# Patient Record
Sex: Female | Born: 1945 | Race: Black or African American | Hispanic: No | Smoking: Former smoker
Health system: Southern US, Community
[De-identification: ages and names within clinical notes are randomized; demographics above are authoritative.]

## PROBLEM LIST (undated history)

## (undated) DIAGNOSIS — I714 Abdominal aortic aneurysm, without rupture, unspecified: Secondary | ICD-10-CM

## (undated) DIAGNOSIS — D689 Coagulation defect, unspecified: Secondary | ICD-10-CM

## (undated) DIAGNOSIS — E039 Hypothyroidism, unspecified: Secondary | ICD-10-CM

## (undated) DIAGNOSIS — I10 Essential (primary) hypertension: Secondary | ICD-10-CM

## (undated) DIAGNOSIS — Z940 Kidney transplant status: Secondary | ICD-10-CM

## (undated) HISTORY — PX: KIDNEY TRANSPLANT: SHX239

## (undated) HISTORY — DX: Abdominal aortic aneurysm, without rupture: I71.4

## (undated) HISTORY — DX: Hypothyroidism, unspecified: E03.9

## (undated) HISTORY — DX: Kidney transplant status: Z94.0

## (undated) HISTORY — DX: Morbid (severe) obesity due to excess calories: E66.01

## (undated) HISTORY — PX: ABDOMINAL AORTIC ANEURYSM REPAIR: SHX42

## (undated) HISTORY — DX: Abdominal aortic aneurysm, without rupture, unspecified: I71.40

## (undated) HISTORY — DX: Coagulation defect, unspecified: D68.9

---

## 1997-10-08 ENCOUNTER — Other Ambulatory Visit: Admission: RE | Admit: 1997-10-08 | Discharge: 1997-10-08 | Payer: Self-pay

## 1997-11-15 ENCOUNTER — Ambulatory Visit: Admission: RE | Admit: 1997-11-15 | Discharge: 1997-11-15 | Payer: Self-pay

## 1999-08-13 ENCOUNTER — Emergency Department (HOSPITAL_COMMUNITY): Admission: EM | Admit: 1999-08-13 | Discharge: 1999-08-13 | Payer: Self-pay | Admitting: Emergency Medicine

## 1999-08-13 ENCOUNTER — Encounter: Payer: Self-pay | Admitting: Emergency Medicine

## 1999-11-11 ENCOUNTER — Emergency Department (HOSPITAL_COMMUNITY): Admission: EM | Admit: 1999-11-11 | Discharge: 1999-11-11 | Payer: Self-pay | Admitting: Emergency Medicine

## 1999-11-11 ENCOUNTER — Encounter: Payer: Self-pay | Admitting: *Deleted

## 2000-03-25 ENCOUNTER — Emergency Department (HOSPITAL_COMMUNITY): Admission: EM | Admit: 2000-03-25 | Discharge: 2000-03-26 | Payer: Self-pay | Admitting: Emergency Medicine

## 2000-04-23 ENCOUNTER — Emergency Department (HOSPITAL_COMMUNITY): Admission: EM | Admit: 2000-04-23 | Discharge: 2000-04-23 | Payer: Self-pay | Admitting: Emergency Medicine

## 2000-09-25 ENCOUNTER — Other Ambulatory Visit: Admission: RE | Admit: 2000-09-25 | Discharge: 2000-09-25 | Payer: Self-pay | Admitting: Family Medicine

## 2004-01-25 ENCOUNTER — Encounter: Admission: RE | Admit: 2004-01-25 | Discharge: 2004-01-25 | Payer: Self-pay | Admitting: Family Medicine

## 2004-12-11 ENCOUNTER — Ambulatory Visit (HOSPITAL_COMMUNITY): Admission: RE | Admit: 2004-12-11 | Discharge: 2004-12-11 | Payer: Self-pay | Admitting: Internal Medicine

## 2005-01-04 ENCOUNTER — Ambulatory Visit (HOSPITAL_COMMUNITY): Admission: RE | Admit: 2005-01-04 | Discharge: 2005-01-04 | Payer: Self-pay | Admitting: Gastroenterology

## 2005-04-25 ENCOUNTER — Ambulatory Visit (HOSPITAL_COMMUNITY): Admission: RE | Admit: 2005-04-25 | Discharge: 2005-04-25 | Payer: Self-pay | Admitting: Internal Medicine

## 2005-04-25 ENCOUNTER — Inpatient Hospital Stay (HOSPITAL_COMMUNITY): Admission: AD | Admit: 2005-04-25 | Discharge: 2005-04-30 | Payer: Self-pay | Admitting: Internal Medicine

## 2005-05-26 ENCOUNTER — Inpatient Hospital Stay (HOSPITAL_COMMUNITY): Admission: EM | Admit: 2005-05-26 | Discharge: 2005-06-08 | Payer: Self-pay | Admitting: Emergency Medicine

## 2005-06-25 ENCOUNTER — Emergency Department (HOSPITAL_COMMUNITY): Admission: EM | Admit: 2005-06-25 | Discharge: 2005-06-25 | Payer: Self-pay | Admitting: Emergency Medicine

## 2005-06-26 ENCOUNTER — Ambulatory Visit (HOSPITAL_COMMUNITY): Admission: RE | Admit: 2005-06-26 | Discharge: 2005-06-26 | Payer: Self-pay | Admitting: Emergency Medicine

## 2005-06-28 ENCOUNTER — Ambulatory Visit: Payer: Self-pay | Admitting: Oncology

## 2005-07-02 ENCOUNTER — Emergency Department (HOSPITAL_COMMUNITY): Admission: EM | Admit: 2005-07-02 | Discharge: 2005-07-02 | Payer: Self-pay | Admitting: Emergency Medicine

## 2005-07-20 ENCOUNTER — Ambulatory Visit (HOSPITAL_COMMUNITY): Admission: RE | Admit: 2005-07-20 | Discharge: 2005-07-20 | Payer: Self-pay | Admitting: Vascular Surgery

## 2005-08-16 ENCOUNTER — Ambulatory Visit: Payer: Self-pay | Admitting: Oncology

## 2005-08-24 ENCOUNTER — Ambulatory Visit (HOSPITAL_COMMUNITY): Admission: RE | Admit: 2005-08-24 | Discharge: 2005-08-24 | Payer: Self-pay | Admitting: Vascular Surgery

## 2005-09-23 ENCOUNTER — Ambulatory Visit (HOSPITAL_COMMUNITY): Admission: EM | Admit: 2005-09-23 | Discharge: 2005-09-23 | Payer: Self-pay | Admitting: Emergency Medicine

## 2006-11-30 ENCOUNTER — Observation Stay (HOSPITAL_COMMUNITY): Admission: EM | Admit: 2006-11-30 | Discharge: 2006-11-30 | Payer: Self-pay | Admitting: Emergency Medicine

## 2006-11-30 ENCOUNTER — Ambulatory Visit: Payer: Self-pay | Admitting: *Deleted

## 2007-05-01 ENCOUNTER — Encounter: Payer: Self-pay | Admitting: Nephrology

## 2007-05-01 ENCOUNTER — Ambulatory Visit (HOSPITAL_COMMUNITY): Admission: RE | Admit: 2007-05-01 | Discharge: 2007-05-01 | Payer: Self-pay | Admitting: Nephrology

## 2007-05-27 ENCOUNTER — Encounter: Admission: RE | Admit: 2007-05-27 | Discharge: 2007-05-27 | Payer: Self-pay | Admitting: Nephrology

## 2008-02-05 ENCOUNTER — Encounter: Admission: RE | Admit: 2008-02-05 | Discharge: 2008-04-09 | Payer: Self-pay | Admitting: Neurology

## 2008-02-26 ENCOUNTER — Encounter: Admission: RE | Admit: 2008-02-26 | Discharge: 2008-02-26 | Payer: Self-pay | Admitting: Nephrology

## 2008-03-18 ENCOUNTER — Encounter: Admission: RE | Admit: 2008-03-18 | Discharge: 2008-06-16 | Payer: Self-pay | Admitting: Nephrology

## 2008-03-23 ENCOUNTER — Ambulatory Visit (HOSPITAL_COMMUNITY): Admission: RE | Admit: 2008-03-23 | Discharge: 2008-03-23 | Payer: Self-pay | Admitting: Nephrology

## 2009-05-08 DIAGNOSIS — E1143 Type 2 diabetes mellitus with diabetic autonomic (poly)neuropathy: Secondary | ICD-10-CM | POA: Insufficient documentation

## 2010-07-15 ENCOUNTER — Encounter: Payer: Self-pay | Admitting: Gastroenterology

## 2010-07-16 ENCOUNTER — Encounter: Payer: Self-pay | Admitting: Nephrology

## 2010-07-16 ENCOUNTER — Encounter: Payer: Self-pay | Admitting: Internal Medicine

## 2010-07-16 ENCOUNTER — Encounter: Payer: Self-pay | Admitting: Gastroenterology

## 2010-11-07 NOTE — Discharge Summary (Signed)
NAMEGURNEET, [name]            ACCOUNT NO.:  [id number]   MEDICAL RECORD NO.:  [id number]          PATIENT TYPE:  OBV   LOCATION:  [id number]                         FACILITY:  [facility]   PHYSICIAN:  Darlin Coco, M.D. DATE OF BIRTH:  15-Jun-1946   DATE OF ADMISSION:  11/29/2006  DATE OF DISCHARGE:  11/30/2006                               DISCHARGE SUMMARY   FINAL DIAGNOSES:  1. Chest pain probably secondary to acute pericarditis.  2. End-stage renal disease.  3. Pulmonary emboli ruled out.  4. Acute myocardial infarction ruled out.  5. Hyperlipidemia.  6. Hypothyroidism.   HISTORY:  This 65 year old African American woman with end-stage renal  disease was admitted as an overnight observation patient because of  chest pain.  The pain was pleuritic.  It was worse when she took a deep  breath and worse when she lies down.  In the emergency room she had  negative cardiac enzymes and a normal chest x-ray.  She also had a CT  angiogram because of an elevated D-dimer and the CT angiogram was  negative for pulmonary emboli.  Also no pericardial fluid could be seen  and she was not noted to have any coronary artery calcification on the  CT scan.  The patient's cycled cardiac enzymes had negative results and  she was feeling slightly better the following morning on full dose  aspirin.  However, the pain is persisting and we are going to give her a  course of a Medrol Dosepak and discharge.  Her vital signs remained  stable and she will be discharged on November 30, 2006.   ACCESSORY LABORATORY STUDIES:  Hemoglobin is 10.7 and hematocrit is  32.8; this is consistent with her chronic renal disease.  Her serum  creatinine on admission was 4.57, BUN was 13, sodium was 137, potassium  was 4.2 and blood sugar was 113.  Cardiac enzymes were negative x2.  Her  LDL cholesterol was 113 and she is intolerant to statins but is able to  take Zetia; triglycerides are 88.  Her D-dimer is elevated at 216 with  a  normal being up to 0.48.  TSH level is pending.   DISCHARGE MEDICATIONS:  The patient is being discharged improved on the  following regimen:  1. Multivitamin once a day.  2. Synthroid 100 mcg daily.  3. Zetia 10 mg daily.  4. Omeprazole 40 mg or Protonix 40 mg daily.  5. Aspirin 81 mg daily.  6. PhosLo as directed by her nephrologist.  7. She will use Tylenol as needed for pain.  8. We are also giving her a Medrol Dosepak.   DISPOSITION:  The patient will be seen back in my office at 9:15 a.m. on  Tuesday, December 03, 2006 for an office visit and a follow-up EKG.  At that  time will make arrangements for outpatient Cardiolite stress testing for  risk stratification and further evaluation.   CONDITION ON DISCHARGE:  Improved.           ______________________________  Darlin Coco, M.D.     TB/MEDQ  D:  11/30/2006  T:  11/30/2006  Job:  [id number] 

## 2010-11-07 NOTE — H&P (Signed)
NAMEJAKLYNN, [name]            ACCOUNT NO.:  [id number]   MEDICAL RECORD NO.:  [id number]          PATIENT TYPE:  EMS   LOCATION:  MAJO                         FACILITY:  [facility]   PHYSICIAN:  Haynes Kerns, MD    DATE OF BIRTH:  1946/03/30   DATE OF ADMISSION:  11/29/2006  DATE OF DISCHARGE:                              HISTORY & PHYSICAL   PRIMARY NEPHROLOGIST:  Sol Blazing, M.D.   PRIMARY CARE PHYSICIAN:  Dr. Arnoldo Morale.   CHIEF COMPLAINT:  Chest pain.   HISTORY OF PRESENT ILLNESS:  The patient is a 65 year old African  American female with past medical history notable for end-stage renal  disease on hemodialysis Monday, Wednesday and Friday who presents for  evaluation and chest pain.  The patient states that the pain began  yesterday evening after picking up a friend's baby.  The patient felt  like she strained herself at the time when she picked up the child.  Her  primary discomfort is localized in the lower anterior neck region and  extending into the anterior chest.  She denies any chest pain,  radiation.  She was given pain medications in the emergency department  that did help alleviate symptoms.  The pain was made worse by deep  inspiration, or with movement.  The patient also notes that she is able  to palpate and reproduce the pain when pushing over the lower part of  her neck.  She denies any shortness of breath, dyspnea on exertion, PND,  orthopnea, presyncope or syncope.  She has no prior history of coronary  artery disease.  In the emergency department, her initial EKG revealed  normal sinus rhythm with probable LVH but no evidence of acute injury or  ischemia.  Her initial point of care biomarkers were negative x1.  Otherwise she is without complaints.   REVIEW OF SYSTEMS:  As per HPI; otherwise complete review of systems is  negative.   PAST MEDICAL HISTORY:  1. End-stage renal disease on hemodialysis Monday, Wednesday and      Friday.  2.  Hypothyroidism.  3. Hyperlipidemia.   ALLERGIES:  No known drug allergies.   CURRENT MEDICATIONS:  1. Multivitamins daily.  2. Synthroid 100 mcg daily.  3. Omeprazole 40 mg daily.  4. Aspirin 81 mg daily.  5. Zetia 10 mg daily.   SOCIAL HISTORY:  The patient lives in [place] with her son and  daughter.  She smokes one pack of cigarettes per day.  She endorses  occasional alcohol intake. Denies any illicit substances.   FAMILY HISTORY:  Father died from coronary artery disease at the age of  58.  Her mother passed away secondary to complications of pancreatitis.  She has one sister and one brother who are alive and well.   PHYSICAL EXAMINATION:  VITAL SIGNS:  Blood pressure 125/76, heart rate  78, oxygen saturation 100% on room air.  GENERAL:  She is alert and oriented x3, in no acute distress.  Pleasant,  conversant.  NECK:  Supple with full range of motion.  No JVD.  There is mild  palpable thyromegaly as well  as discomfort over the anterior base of the  neck. There is no palpable lymphadenopathy.  CHEST:  Crackles bilaterally at the bases; otherwise clear to  auscultation.  CARDIOVASCULAR:  Normal S1 and S2.  No audible murmur, rubs or gallops.  Her peripheral pulses are 2+ and symmetric bilaterally.  ABDOMEN:  Soft, nontender, nondistended.  Positive bowel sounds.  No  hepatosplenomegaly.  EXTREMITIES:  No clubbing, cyanosis or edema.  There is an ecchymosis on  her right lower extremity which she struck on a bed rail recently.  MUSCULOSKELETAL:  No joint deformity or effusions.  Her muscle strength  and tone are intact throughout.  NEUROLOGIC:  Grossly nonfocal.   LABORATORY DATA:  Electrocardiogram shows normal sinus rhythm with LVH  by voltage criteria.  No evidence of acute anterior ischemia.   Chest x-ray:  No acute cardiopulmonary process.   Sodium 133, potassium 5.1, chloride 100, BUN 15, glucose 120, creatinine  4.5.  CK-MB 1.9, troponin-I less than  0.05.   IMPRESSION:  1. Chest pain, rule out myocardial infarction.  2. End-stage renal disease.  3. Hypothyroidism.  4. Hyperlipidemia.   PLAN:  From a cardiovascular standpoint, the patient is hemodynamically  stable and currently chest pain free.  Review of her initial evaluation  shows her EKG reveals no evidence of acute injury or ischemia.  He  second EKG in the ED is consistent with possible pericarditis.  Her  initial cardiac biomarkers are x1.  The nature of her symptoms are  somewhat atypical ischemia however may be consistent with pericarditis.  She does have risk factors including end-stage renal disease,  hyperlipidemia, no ongoing tobacco abuse.  We will admit the patient to  telemetry for further evaluation. We will cycle cardiac biomarkers and  rule out myocardial infarction.  There is no rub by physical examination  however given that she has ESRD, consider a TTE to rule out effusion.  We will check her a.m. fasting lipid profile and consideration should be  given to initiation of a statin medication given her concomitant end-  stage renal disease and the potential benefits.  We will continue with  her daily aspirin and the rest of her home medication regimen as  mentioned above.  If the patient rules out for myocardial infarction, we  will consider further outpatient risk stratification for CAD.  The  patient did complete her dialysis session earlier this afternoon and  there is no indication for acute hemodialysis at this time.      Haynes Kerns, MD  Electronically Signed    SHG/MEDQ  D:  11/30/2006  T:  11/30/2006  Job:  [id number]

## 2010-11-10 NOTE — Consult Note (Signed)
[name], TOPPING           ACCOUNT NO.:  [id number]   MEDICAL RECORD NO.:  [id number]          PATIENT TYPE:  INP   LOCATION:  6729                         FACILITY:  [facility]   PHYSICIAN:  Sol Blazing, M.D.DATE OF BIRTH:  September 06, 1945   DATE OF CONSULTATION:  05/26/2005  DATE OF DISCHARGE:                                   CONSULTATION   REASON FOR CONSULTATION:  Elevated creatinine and potassium.   IMPRESSION:  1.  Acute on chronic renal failure. I suspect she has bilateral obstruction      to blood clots. She has crampy lower abdominal and inguinal pain and      gross hematuria after starting Coumadin recently. Her creatinine was 2.6      in early November, several weeks ago.  2.  Gross hematuria with passing clots, on Coumadin with coagulopathy. She      had a urology workup in the recent past for microscopic hematuria.  3.  Coagulopathy due to Coumadin.  4.  Left deep venous thrombosis, November 2006.  5.  Creatinine of 2.6 at baseline. No proteinuria or microhematuria.      Negative workup, November 2006, by Dr. Erling Cruz.  6.  Hyperkalemia, 7.3. No EKG changes.   PLAN:  1.  Acute dialysis tonight to stabilize potassium.  2.  Noncontrast CT tonight (or ultrasound in the a.m.) to evaluate for      hydronephrosis. If she is obstructed, percutaneous nephrostomies would      be next step. This is usually done through interventional radiology.  3.  May need urology input. I am sure at this point.  4.  Correct INR acutely.   HISTORY:  The patient is a 65 year old African American female with chronic  kidney disease, baseline creatinine of about 2.4 to 2.6 with microscopic  hematuria and recent admission for DVT started on Coumadin early in November  this year. She had a renal workup at that time by Dr. Erling Cruz with a  negative SPEP, negative ANA and ANCA. Ultrasound showed increased  echogenicity, but no hydronephrosis. A chest x-ray was negative. Urinalysis  at that had no protein, but 7-10 red blood cells. She also apparently had a  recent urology workup for hematuria by Dr. Janice Norrie with a negative pelvis,  abdominal CT and a negative cystoscopy.   She was started on Coumadin several weeks ago for a left DVT and discharged  home. Now she presents with gross hematuria for one week since thanksgiving,  but did not see her doctor until yesterday. She did not want to have to  come back in the hospital. She has been passing clots for several days and  has had crampy bilateral inguinal and lower quadrant abdominal pain. She has  had nausea, vomiting, and malaise for several days and has not eaten for a  week. In the emergency room her potassium is 7.3 with a creatinine over 20,  and BUN greater than 140. Hematocrit of 22%. She has had no fever, chills,  or sweats.   She has been having difficulty urinating and headaches.   PAST MEDICAL HISTORY:  1.  Hypothyroid.  2.  Chronic renal disease as above.  3.  GERD.  4.  Osteopenia.   ALLERGIES:  None.   MEDICATIONS:  1.  Synthroid 100 mcg daily.  2.  Calcium.  3.  Protonix.  4.  Restoril.  5.  Coumadin.   SOCIAL HISTORY:  She is employed. She lives with a disabled in his early  64s. She has a grown daughter going to school and one other grown son out of  the house. No alcohol. She smokes one pack of cigarettes per day. She is  divorced.   FAMILY HISTORY:  No family history of renal disease. Positive for diabetes  and hypertension.   REVIEW OF SYSTEMS:  GENERAL: Denies fever, chills, sweats, or weight loss.  ENT: Denies hear loss, visual change, sore throat, difficulty swallowing,  CARDIORESPIRATORY: Denies shortness of breath or chest pain, productive  cough, or wheezing. GI: Vomiting with no oral intake for several days. No  diarrhea. Pain as above. GU: Poor urine output, difficulty voiding, passing  blood clots. MUSCULOSKELETAL: No arthralgia or myalgia. NEUROLOGIC: No focal  numbness or  weakness. ENDOCRINE: No heat or cold intolerance. PSYCHIATRIC:  No symptoms of depression or hallucination.   PHYSICAL EXAMINATION:  VITAL SIGNS: Blood pressure 120/70, pulse 100,  respirations 20, temperature 98.  GENERAL: The patient is alert, well-developed, and appropriate middle-age  female in no acute distress.  SKIN: Without rash.  HEENT: PERRLA. EOMI. The throat is clear.  NECK: Supple without JVD.  CHEST: Clear throughout.  CARDIAC: Regular rate and rhythm without murmurs, rubs, or gallops.  ABDOMEN: Soft and nontender. No mass, organomegaly, or bruits.  EXTREMITIES: Positive hematoma in the left arm. No peripheral edema.  NEUROLOGIC: Alert and oriented times three. No asterixis. Lucid with  nonfocal motor exam.   LABORATORY DATA:  Sodium 130, potassium 7.3, BUN greater than 140,  creatinine greater than 20. White blood cell count 12,000, hemoglobin 7.7,  platelet count 313,000.   EKG reveals a normal sinus rhythm with left axis deviation and LVH by  voltage. QRS is narrow.  Chest x-ray reveals no acute disease. INR over 12.  PT greater than 90 seconds.      Sol Blazing, M.D.  Electronically Signed     RDS/MEDQ  D:  05/26/2005  T:  05/27/2005  Job:  [id number]

## 2010-11-10 NOTE — Discharge Summary (Signed)
[name], [name]            ACCOUNT NO.:  [id number]   MEDICAL RECORD NO.:  [id number]           PATIENT TYPE:   LOCATION:                               FACILITY:  [facility]   PHYSICIAN:  Alvin C. Florene Glen, M.D.  DATE OF BIRTH:  10/09/1945   DATE OF ADMISSION:  05/26/2005  DATE OF DISCHARGE:  06/08/2005                                 DISCHARGE SUMMARY   ADMISSION DIAGNOSES:  1.  Acute on chronic renal failure suspected secondary to hematuria/clots.  2.  Gross hematuria, on Coumadin.  3.  Hyper-coagulopathy secondary to medication.  4.  Lower extremity deep vein thrombosis November 2006.  5.  Chronic kidney disease.  6.  Hyperkalemia with no electrocardiogram changes.   DISCHARGE DIAGNOSES:  1.  Acute on chronic renal failure secondary to hyper-necrosis with intra-      renal hemorrhage with hyper-coagulopathy secondary to medications.  2.  Lower extremity deep vein thrombosis status post IVC filter.  3.  Hyper-coagulopathy secondary to medications.  4.  Anemia of chronic disease and secondary to lower extremity deep vein      thrombosis.  5.  Hematuria.  6.  Secondary hyperparathyroidism.  7.  Hypothyroidism.   HISTORY OF PRESENT ILLNESS:  [name] is a 65 year old black female  admitted by Dr. Jeanann Lewandowsky and Dr. Nolene Ebbs service for known  history of chronic renal insufficiency, recent diagnosis of deep vein  thrombosis of lower extremity. Discharged from [facility] several  weeks ago, on Coumadin 10 mg a day. She is prompted now to return to the  emergency room with chief complaint of decreased urination, anorexia,  nausea, and headache. She had history of microscopic hematuria, lasting 7 to  10 days. She noted more pronounced gross hematuria of 1 day and a second  episode of gross hematuria with nose bleed now. She denies any back pain,  fever, or chills. Presents with hemoglobin of 7.7, hematocrit 22, BUN over  140, creatinine 20, potassium 7.4.  Admitted by her Internist with nephrology  consult obtained.   PHYSICAL EXAMINATION:  VITAL SIGNS:  On admission temperature 98 degrees.  Blood pressure 120/70, pulse of 100, respiratory rate 20.  GENERAL:  Alert, black female in no acute distress. No jugular venous  distention appreciated.  CHEST:  Clear to auscultation throughout.  CARDIAC:  Regular rate and rhythm. No rub appreciated.  ABDOMEN:  Bowel sounds positive. Soft, nontender. No hepatosplenomegaly  appreciated. No bruits heard.  EXTREMITIES:  No pedal edema.  SKIN:  Hematoma of left arm.  NEUROLOGIC:  Alert and oriented times three. No asterixis appreciated.   LABORATORY DATA:  On admission potassium 7.3, sodium 130, chloride 108, BUN  over 140, creatinine over 20. Hemoglobin 7.7. White count 12.6. Platelets  313,000. Her INR was over 11.9. Prothrombin time over 90 seconds.   EKG:  Normal sinus rhythm. No acute changes.   Chest x-ray showed no acute disease.   CONSULTATIONS:  Dr. Roney Jaffe was called in for nephrology to assist in  admission.   HOSPITAL COURSE:  1.  ACUTE ON CHRONIC RENAL FAILURE:  Dr. Jonnie Finner  saw the patient on May 26, 2006, day of admission. He suspected that she had obstruction      secondary to bleed, bleeding clots, because she had a creatinine of 2.6      in early November 2006 with microscopic hematuria with a negative cysto      in the recent past. Her elevated INR is thought secondary to medication,      Coumadin 10 mg per day for her DVT. As her EKG had no changes and      potassium of 7.3, it was thought that she would need acute hemodialysis      to stabilized that potassium and a non-contrast CT scan to evaluate for      hydronephrosis. Should she be obstructed, she would need percutaneous      stenting by urology. He recommended to correct INR acutely also. A right      femoral hemodialysis catheter was placed by Dr. Jonnie Finner on the evening      of May 26, 2005 without  difficulty. She had hemodialysis without      complications and felt better the next day with her BUN down to 89,      creatinine down to 13.5, potassium corrected to 4.1, and INR was      improved to 2.9. Urology did see the patient and Dr. Jeanann Lewandowsky saw      the patient on May 27, 2005. CT scan showed no significant      obstruction. Blood noted in the renal pelvis and bladder. His impression      was renal bleed secondary to elevated INR and it should correct itself      off anti-coagulation. He did not advise to place double J stent at this      time.   The patient's renal failure did not improve. It was felt that she had acute  tubular necrosis because of this large bleed. CVTS was consulted. Perm  catheter was placed by Dr. Drucie Opitz without difficulty. Seen by Dr.  Deitra Mayo for scheduling of left AV fistulograph after vein  mapping in hospital. Outpatient hemodialysis was arranged with [facility] on Tuesday, Thursday, and Saturday schedule. The  patient was tolerating hemodialysis through a perm catheter that Dr. Drucie Opitz placed without complications.   #2.  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 Because of her bleed, elevated INR on  Coumadin, it was felt that she should require IVC filter. Dr. Jonnie Finner  consulted Invasive Radiology. A IVC filter was placed on December 9, [id number]  without complications by Dr. Ginny Forth. The patient's INR did improved  from over 11.9 in the ER the next day to 2.9 and 2.0 and was 1.1 on May 31, 2005.  Noted, urine culture had showed no growth with her hematuria.   #3.  ANEMIA OF CHRONIC DISEASE AND ACUTE BLEED:  The patient's hemoglobin  stabilized after transfusion of packed red blood cells, starting on Epogen  and Aranesp in the hospital.  On discharge, he was on Aranesp 200 mg every Saturday's. Hemoglobin was 10.4 on the 15th of December.  Hemoglobin should  be followed up as an outpatient with patient being on Epogen at discharge.   #4.  SECONDARY HYPOPARATHYROIDISM:  Because of renal failure. PTH was  obtained during hospital stay. Was found to be elevated at 105.4. She was  started on IV Hectorol via  hemodialysis. Also noted her calcium was 9.9,  phosphorus of 3.0, with an albumin of 3.3. She was on Tums 100 mg before  meals for phosphate binders with good control.   DISPOSITION:  Thus, patient was discharged on hemodialysis at [facility] on Tuesday, Thursday, Saturday schedule.   FOLLOW UP:  With CVTS for access placement in her arm. Will follow  chemistries, hemoglobin as an outpatient.   SPECIAL INSTRUCTIONS:  At this time again noted NO Coumadin because of her  bleed and she has an IVC filter in place because of her hemorrhagic  condition.   PENDING LABORATORY STUDIES:  Lipids, anticoagulants, and anti-platelets were  pending at time of discharge.   DISCHARGE MEDICATIONS:  1.  Levothyroxine 100 mcg daily.  2.  Nephro-Vite 1 daily.  3.  Colace 100 mg 2 at bedtime.  4.  Tums 500 mg before meals.  5.  __________ 40 mg daily.      Foye Clock, P.A.      Darrold Span. Florene Glen, M.D.  Electronically Signed    DWZ/MEDQ  D:  08/31/2005  T:  09/01/2005  Job:  [id number]   cc:   Lucina Mellow. Terance Hart, M.D.  Fax: [phone]   Jeanann Lewandowsky, M.D.  Fax: [phone]   Nolene Ebbs, M.D.  [phone]   Sol Blazing, M.D.  Fax: [phone]

## 2010-11-10 NOTE — Consult Note (Signed)
[name], [name]           ACCOUNT NO.:  [id number]   MEDICAL RECORD NO.:  [id number]          PATIENT TYPE:  INP   LOCATION:  5502                         FACILITY:  [facility]   PHYSICIAN:  Alvin C. Florene Glen, M.D.  DATE OF BIRTH:  Jan 08, 1946   DATE OF CONSULTATION:  04/29/2005  DATE OF DISCHARGE:                                   CONSULTATION   HISTORY OF PRESENT ILLNESS:  The patient is a 65 year old African-American  woman with a past medical history of hypothyroidism and chronic renal  insufficiency with a baseline creatinine of 1.8-2.4 admitted April 25, 2005 with a lower extremity DVT for thrombosis work-up and to begin  anticoagulation.  We are consulted for evaluation of chronic renal  insufficiency with microscopic hematuria.   PAST MEDICAL HISTORY:  1.  Hypothyroidism.  2.  Chronic hematuria of unknown etiology with a negative urology work-up      including a negative CT of the abdomen and pelvis and a negative      cystoscopy per Dr. Hanley Ben.  3.  GERD.  4.  Osteopenia.  5.  Chronic renal insufficiency with a baseline creatinine of 1.8-2.4.   ALLERGIES:  No known drug allergies.   MEDICATIONS:  1.  Synthroid 100 mcg daily.  2.  Calcium with vitamin D one tablet p.o. b.i.d.  3.  Protonix 40 mg p.o. b.i.d.  4.  Restoril 15 mg p.o. q.h.s. p.r.n.  5.  Lovenox 100 mg subcutaneous daily.  6.  Coumadin protocol per pharmacy.   SOCIAL HISTORY:  The patient is currently employed.  She lives with her  disabled son.  She has a daughter nearby.  She denies any alcohol or illicit  drugs.  She smokes one pack per day.   FAMILY HISTORY:  No known family history of kidney disease, end-stage renal  disease, SLE.  She has a family history positive for diabetes and  hypertension.   REVIEW OF SYMPTOMS:  The patient denies dysuria, gross hematuria, frequency,  urgency.  She denies cough, shortness of breath, hemoptysis, pleurisy.  She  denies chest pain.  She denies  abdominal pain.  She is moving her bowels  well.  She denies any recent rashes.  She denies lower extremity swelling  before the current hospitalization.   PHYSICAL EXAMINATION:  GENERAL:  She is alert, in no acute distress.  HEENT:  Her pupils are equal, round, and reactive to light and  accommodation.  Her extraocular muscles are intact.  She has muddy sclera.  Her oropharynx is clear.  She has poor dentition.  CARDIOVASCULAR:  Regular rate and rhythm.  No murmurs.  RESPIRATORY:  Clear to auscultation bilaterally with no wheezes or crackles.  ABDOMEN:  Soft, nontender, nondistended with positive bowel sounds.  EXTREMITIES:  Trace bilateral lower extremity edema.   LABORATORIES:  CBC from April 28, 2005 shows a white count of 9.7,  hemoglobin 9.3, hematocrit 27.8, platelets 280.  CMET shows sodium of 138,  potassium 4.7, chloride 113, bicarbonate 21, BUN 27, creatinine 2.6, glucose  of 85.  Tbili 0.4, alkaline phosphatase 87, AST 19, ALT 17, total protein  7.3, albumin 3, calcium 8.9.  Her creatinine trend has been 2.9, 2.7, 2.6.  Her estimated creatinine clearance is 23.5 mL/minute.  A UA done on April 25, 2005 shows large blood, no red blood cells on microscopy, and few  bacteria.  A UA done on April 29, 2005 shows large blood with 7-10 red  blood cells, no proteinuria and anemia work-up includes an iron of 45, TIBC  of 221, ferritin 217, folate 378, B12 694.  She has a TSH that is 5.65 and  an LDH of 142.  Her occult malignancy work-up includes a CEA of 1.7, a CA 19-  9 of 13.6, a CA-125 of 10.4, an AFP of 3.3, an ESR of 103.  An ANA was  negative.  An ANCA was negative.  A CRP was 4.3.  Her hypercoagulation work-  up includes an antithrombin III of 103, protein C of 179, a protein S of 76,  a PTTLA of 46.3, a DRWT of 49.9.  SLE anticoagulant was negative.  Homocysteine was 19.2.  Beta II GP4 was 4.  A cardiolipin was 18.  Cardiolipin IgM was 12.  CK was 305, myoglobin 107.   Protein electrophoresis  shows a low serum albumin and nonspecific in in gamma globulins.  A renal  ultrasound done December 11, 2004 shows bilateral increased echogenicity  consistent with medical renal disease, no hydronephrosis.  A chest x-ray  done April 29, 2005 shows no acute disease.   ASSESSMENT/PLAN:  This is a pleasant 65 year old African-American woman with  lower extremity deep venous thrombosis, chronic renal insufficiency, and  microscopic hematuria.  1.  Deep venous thrombosis.  The patient is on Coumadin.  Her      hypercoagulability work-up shows elevated homocysteine and decreased      protein S.  Her occult malignancy work-up is negative but she has an      elevated ESR and CRP.  The management of this is per her primary      physician.  2.  Chronic renal insufficiency with microscopic hematuria.  Her creatinine      is trending down but her estimated creatinine clearance is about 24 and      a recent renal ultrasound is consistent with medical renal disease.  The      etiology of this is unclear.  We will check a fresh spun urine to      evaluate urine sediment and will also check an anti GBM and C3 levels.      She will need renal dosing of her medicines in the future and although a      renal biopsy may be helpful in the diagnosis, it will not be able to be      done during this hospitalization as she has already been started on      anticoagulation therapy.  She will likely deteriorate over the next few      years and may develop end-stage renal disease and need hemodialysis in      the future.  3.  Anemia.  Her anemia is likely anemia of chronic disease secondary to      chronic renal insufficiency, although fecal occult blood tests are      pending.  The anemia is normocytic.  Epogen or Aranesp should be      considered.      Baxter Kail, M.D.      Darrold Span. Florene Glen, M.D.  Electronically Signed   DC/MEDQ  D:  04/30/2005  T:  04/30/2005  Job:  [id number]

## 2010-11-10 NOTE — Consult Note (Signed)
[name], [name]            ACCOUNT NO.:  [id number]   MEDICAL RECORD NO.:  [id number]          PATIENT TYPE:  INP   LOCATION:  6729                         FACILITY:  [facility]   PHYSICIAN:  Lucina Mellow. Terance Hart, M.D.DATE OF BIRTH:  1945/12/03   DATE OF CONSULTATION:  DATE OF DISCHARGE:                                   CONSULTATION   I was asked to see this 65 year old lady who has a history of having had a  DVT and put on Coumadin and unfortunately, her INR got up to greater than 12  and her PT got to over 90 when she was admitted yesterday with several days  of gross hematuria and a creatinine had gone from baseline of 2.5 up to 20  and now is requiring dialysis.  She denies much in the way of flank pain.  She has had occasional stress incontinence in the past.  She has had an  occasional UTI.  She has no history of stones.  CT scan showed some fullness  in the renal pelvis probably with blood clot and some blood in the bladder  but no significant obstructive uropathy.   PAST MEDICAL HISTORY:  1.  Reflux.  2.  Gastro esophagitis.  3.  Hypothyroidism.  4.  She had a TNA and breast surgery for calcium deposits.   MEDICATIONS ON ADMISSION:  1.  Os-Cal Plus D 10 mg.  2.  Coumadin.  3.  Protonix.  4.  Synthroid.   ALLERGIES:  NO KNOWN DRUG ALLERGIES.   She denies alcohol.  She does smoke cigarettes.   FAMILY HISTORY:  Noncontributory.   Apparently she is having no nasal bleeding or GI bleeding.   PHYSICAL EXAMINATION:  GENERAL APPEARANCE:  She is alert and oriented.  Does  not seem to be in any acute distress.  VITAL SIGNS:  Blood pressure is 130/86, pulse 96.  She is afebrile.  SKIN:  Warm and dry.  ABDOMEN:  No abdominal or CVA tenderness.  GU:  Foley catheter in place draining blood tinged urine.   IMPRESSION:  Gross  hematuria with interrenal bleeding but no obvious  obstruction at this point that requires correction.  In any case, I would  not want to manipulate  her with her current coagulation status.  In my  experience, kidneys tend to recover from this type of insult.  I will follow  her with you and certainly insert double Js at a later date if appropriate.      Lucina Mellow. Terance Hart, M.D.  Electronically Signed     LJP/MEDQ  D:  05/27/2005  T:  05/28/2005  Job:  [id number]   cc:   Jeanann Lewandowsky, M.D.  Fax: [phone]   [employer]

## 2010-11-10 NOTE — Consult Note (Signed)
[name], [name]            ACCOUNT NO.:  [id number]   MEDICAL RECORD NO.:  [id number]          PATIENT TYPE:  INP   LOCATION:  5506                         FACILITY:  [facility]   PHYSICIAN:  Judeth Cornfield. Scot Dock, M.D.DATE OF BIRTH:  11/20/1945   DATE OF CONSULTATION:  06/07/2005  DATE OF DISCHARGE:  06/08/2005                                   CONSULTATION   REASON FOR CONSULTATION:  Need for long-term hemodialysis access.   HISTORY:  This is a pleasant 65 year old right-handed woman who was admitted  with chronic renal insufficiency with an acute exacerbation. She has had a  right IJ Diatek catheter placed on May 31, 2005 and it appears that she  is dialyzing on Monday, Wednesdays, Fridays when in the hospital although  not aware of what her outpatient schedule would be. She tentatively plans to  be discharged the next day or two.  We are asked to evaluate her for long-  term hemodialysis access.   PAST MEDICAL HISTORY:  Significant for history of DVT in November of 2006,  history of coagulopathy.  She denies any history of previous myocardial  infarction or congestive heart failure. She denies any history of diabetes.   REVIEW OF SYSTEMS:  She has had no recent chest pain, chest pressure,  palpitations or arrhythmias. She has had no significant dyspnea on exertion.   PHYSICAL EXAMINATION:  Her blood pressure is 106/56, heart rate is 88,  temperature 97.8.  Lungs are clear bilaterally to auscultation. On cardiac  exam, she has a regular rate and rhythm. She has a palpable brachial and  radial pulse bilaterally. I do not see usable forearm cephalic vein on  either side.   She has undergone a vein mapping which shows that she has a marginally sized  cephalic vein in both upper arms.  There are multiple branches in the  forearm. She is probably not a good candidate for forearm fistula.   I have recommended that we explore her upper arm cephalic vein on the left  and, if  this is adequate, place an upper arm fistula. If this is not  adequate, then we would place an AV graft. The procedure and potential  complications including but not limited to failure of the fistula to mature,  graft thrombosis, graft infection, and Steele syndrome were discussed with  the patient and all of her questions were answered. She is agreeable to  schedule this as an outpatient on a non-dialysis day.      Judeth Cornfield. Scot Dock, M.D.  Electronically Signed     CSD/MEDQ  D:  06/07/2005  T:  06/08/2005  Job:  [id number]

## 2010-11-10 NOTE — Op Note (Signed)
[name], [name]            ACCOUNT NO.:  [id number]   MEDICAL RECORD NO.:  [id number]          PATIENT TYPE:  INP   LOCATION:  6729                         FACILITY:  [facility]   PHYSICIAN:  Jessy Oto. Fields, MD  DATE OF BIRTH:  April 25, 1946   DATE OF PROCEDURE:  05/31/2005  DATE OF DISCHARGE:                                 OPERATIVE REPORT   PROCEDURE:  1.  Ultrasound of right neck.  2.  Insertion of right internal jugular vein Diatek catheter.   PREOPERATIVE DIAGNOSIS:  Renal failure.   POSTOPERATIVE DIAGNOSIS:  Renal failure.   ANESTHESIA:  Local with IV sedation.   SURGEON:  Jessy Oto. Fields, MD   ASSISTANT:  Nurse.   FINDINGS:  A 23-cm Diatek catheter right internal jugular vein.   PROCEDURE IN DETAIL:  After obtaining informed consent, the patient was  taken to the operating room.  The patient was placed in a supine position on  the operating table.  After adequate sedation, the patient's right neck was  inspected with __________  red ultrasound.  The right internal jugular vein  was found to be widely patent with normal compressibility and respiratory  variation.  Next, the patient's entire neck and chest were prepped and  draped in the usual sterile fashion.  Local anesthesia was infiltrated over  the right internal jugular vein.  A small finder needle was used to localize  the right internal jugular vein.  A larger introducer needle was used to  cannulate the right internal jugular vein and a 0.035 J-tip guide wire  threaded through the right internal jugular vein down to the inferior vena  cava under fluoroscopic guidance.  Next, sequential 12, 14 and 16-French  dilators were placed over the guidewire into the right atrium.  The dilator  and guidewire were removed and a 23-cm Diatek catheter threaded through the  peel-away sheath into the right atrium.  The catheter was then tunneled  subcutaneously, cut to length and the hub attached.  The catheter was noted  to flush and draw easily.  The catheter was inspected under fluoroscopy and  found its tip to be in the right atrium with no kinks throughout its course.  The catheter was sutured in place into place with nylon sutures.  The neck  insertion site was closed with a Vicryl stitch.  The catheter was loaded  with concentrated heparin solution.  The patient tolerated the procedure  well and there were no complications.  Instrument, sponge and needle counts  were correct at the end of the case.  The patient was taken to the recovery  room in stable condition.           ______________________________  Jessy Oto Fields, MD    CEF/MEDQ  D:  05/31/2005  T:  05/31/2005  Job:  [id number]

## 2010-11-10 NOTE — Discharge Summary (Signed)
NAMEQUANASIA, [name]            ACCOUNT NO.:  [id number]   MEDICAL RECORD NO.:  [id number]          PATIENT TYPE:  INP   LOCATION:  5502                         FACILITY:  [facility]   PHYSICIAN:  Nolene Ebbs, M.D.    DATE OF BIRTH:  [birth year]   DATE OF ADMISSION:  04/25/2005  DATE OF DISCHARGE:  04/30/2005                                 DISCHARGE SUMMARY   ADMISSION DIAGNOSES:  1.  Venous thrombophlebitis of the right thigh.  2.  Renal insufficiency.  3.  Microscopic hematuria.   HISTORY OF PRESENT ILLNESS:  [name] is a 65 year old African American  lady with a past medical history significant for hypothyroidism, renal  insufficiency, who was seen in the office with complaints of pain and  swelling in the right thigh. She was sent for venous Doppler of the right  thigh and this revealed a noncompressible greater saphenous vein beginning  at the distal thigh extending to within 2-3 inches from the femoral vein  comparable with extensive superficial venous thrombosis. Due to her history  of renal disease and hematuria, she was therefore admitted for thrombosis  workup as well as anticoagulation.   HOSPITAL COURSE:  On admission the patient was admitted to a medical bed.  Further workup included hypercoagulation profile, PT, PTT, EKG, CBC, CMET,  and hemodialysis. She was started on subcutaneous Lovenox as well as  Coumadin orally, and dose was adjusted by the pharmacist. The right thigh  swelling and pain subsided gradually during the course of admission. On  admission her BUN was 41, creatinine 2.7, hemoglobin 9.5, hematocrit 29,  potassium 4.6. Tumor markers were also performed. Review of her  anticoagulation profile showed protein C was normal, protein S normal, lupus  anticoagulant was slightly elevated at 46.3, factor V Leiden mutation was  negative, homocystine slightly elevated at 19.2. Tumor markers with AFB, CA-  125 antigen, and CA-99 all returned negative. CEA was  also negative. A  nephrology consult was requested for renal insufficiency and chronic  microscopic hematuria.  No specific diagnosis was arrived at, but review of  her laboratory data did not suggest nephritis. Chronic interstitial  nephritis was felt to be a possibility, but renal biopsy would be helpful.  This was deferred for outpatient follow-up. On May 30, 2005, the patient  was without new complaints, was eager to go home, was afebrile, well  hydrated, stable, and chest was clear to auscultation. Abdomen was benign,  bowel sounds were present.  Extremities showed decreased right thigh  swelling and tenderness. CNS as essentially normal. Laboratory data showed  BUN 34, creatinine 2.5, glucose 90, INR 1.5.  The patient was therefore  considered stable for discharge home, to continue a few more days of Lovenox  injection until INR goal of 2-3 was reached. She is to follow up with me in  the office in a few days.   DISCHARGE DIAGNOSES:  1.  Right thigh venous thrombosis.  2.  Hyperhomocysteinemia.  3.  Chronic kidney disease.  4.  Chronic anemia.   DISPOSITION:  For home.   DISCHARGE MEDICATIONS:  1.  Lovenox 100 mg  subcu daily for five days.  2.  Coumadin 5 mg daily.  3.  [name] one tablet daily.  4.  Synthroid 20 mcg daily.  5.  Protonix 40 mg b.i.d.  6.  Os-Cal + D one p.o. b.i.d.  7.  Claritin 10 mg daily.  8.  Nasonex spray, one spray in each nostril once a day.   FOLLOWUP:  With Dr. Ronnie Doss on June 01, 2005; follow up with me on  May 02, 2005. She is told to return for INR check next week and all her  questions were answered.      Nolene Ebbs, M.D.  Electronically Signed     EA/MEDQ  D:  08/18/2005  T:  08/20/2005  Job:  [id number]

## 2010-11-10 NOTE — H&P (Signed)
[name], [name]           ACCOUNT NO.:  [id number]   MEDICAL RECORD NO.:  [id number]          PATIENT TYPE:  INP   LOCATION:  5502                         FACILITY:  [facility]   PHYSICIAN:  Nolene Ebbs, M.D.    DATE OF BIRTH:  [birth year]   DATE OF ADMISSION:  04/25/2005  DATE OF DISCHARGE:                                HISTORY & PHYSICAL   PRESENTING COMPLAINT:  Pain and swelling on the right calf for a week.   HISTORY OF PRESENTING COMPLAINT:  [name] is a 65 year old African  American lady with a past medical history significant for hypothyroidism and  mild renal insufficiency.  She presented to my office about a week ago with  complaints of pain and swelling on the right thigh.  She had thought that  she had been bitten by an insect or bug.  She had some pain, swelling, and  redness on the right thigh.  She was started with a course of antibiotics  but the swelling did not completely resolve.  She was therefore sent for an  venous Doppler study of the right thigh to evaluate for deep venous  thrombosis.  The study was performed today at [facility], Vascular  Lab, and it showed incompressible great saphenous vein beginning at the  distal thigh extending to within 2 to 3 inches from the femoral vein  compatible with extensive superficial venous thrombosis.  [name]  denies any chest pain, shortness of breath, orthopnea, PND, or dyspnea on  exertion.  She has not had any further swelling of the right thigh or leg,  and she denies any fevers or chills.  However in view of her history of  renal disease and hematuria, she is being admitted to the hospital for  thrombosis workup and inception of anticoagulation.   PAST MEDICAL HISTORY:  1.  Hypothyroidism.  2.  Chronic hematuria of unknown etiology.  She has been evaluated by      urology, Dr. Hanley Ben, with a CT scan of the abdomen and pelvis      as well as cystoscopy and no cause of her hematuria  has been identified.  3.  Gastroesophageal reflux disease.  4.  Osteopenia.  5.  Chronic renal insufficiency.  Baseline creatinine between 1.8 to 2.4.   SURGICAL HISTORY:  She has had excision of tumor from her right leg, but she  stated this was benign.   MEDICATION HISTORY:  1.  She is on Synthroid 100 mcg once a day.  2.  Calcium carbonate with vitamin D one p.o. b.i.d.  3.  Protonix 40 mg one p.o. b.i.d.   ALLERGY HISTORY:  She has no known drug allergies.   FAMILY/SOCIAL HISTORY:  [name] is currently employed.  She denies any  use of alcohol, tobacco, or illicit drugs.   REVIEW OF SYSTEMS:  CNS:  She has no headache, dizziness, blurring of  vision, or weakness of her extremities.  CARDIOVASCULAR:  She has no chest  pain, palpitations, orthopnea, PND.  RESPIRATORY:  She has no cough, sputum,  or hemoptysis.  GI:  She denies any abdominal  pain, vomiting, diarrhea, or  constipation.  GU:  She denies any microscopic hematuria.  She has no  dysuria or frequency.  MUSCULOSKELETAL:  She denies any joint pains or joint  swelling.  She does have pain and swelling of her right thigh.   PHYSICAL EXAMINATION:  GENERAL:  [name] is not in acute respiratory or  painful distress.  She is not pale.  She is not icteric.  She is not  dehydrated.  VITAL SIGNS:  Her blood pressure is 131/81, heart rate 100 regular,  respiratory rate of 20, temperature is 98.5, O2 sat on room air is 98%.  Her  weight is 224 pounds and she is 5 feet and 9 inches tall.  NECK:  Supple with no elevated JVD, no cervical lymphadenopathy.  CHEST:  Clear to auscultation with no adventitious sounds.  HEART:  S1 S2 are heard with no murmurs, no S3 gallops.  ABDOMEN:  Obese, soft, nontender.  No masses.  Bowel sounds are present.  EXTREMITIES:  Show multiple swollen nodules on her right thigh with  tenderness and mild erythema.  There is no calf tenderness or swelling.  There is no edema of her ankles or leg.   Her peripheral pulses are present  bilaterally.  CNS:  She is alert and oriented x3 with no focal neurological deficits.   ASSESSMENT:  [name] is a 64 year old African American lady with a past  medical history significant for hypothyroidism, osteopenia, chronic  microscopic hematuria, and chronic renal insufficiency.  She has been  diagnosed with extensive superficial venous thrombosis and thrombophlebitis  on her right thigh.  She is being admitted for thrombotic workup as well as  inception of anticoagulation.  If hyper coagulation workup is negative, she  will likely require workup for occult malignancy.   ADMITTING DIAGNOSES:  1.  Venous thrombophlebitis of the right thigh.  2.  Renal insufficiency.  3.  History of microscopic hematuria.   PLAN OF CARE:  1.  She will be admitted to a medical bed.  2.  Her input and output will be monitored.  3.  Other laboratory data will include CBC, C-MET, urinalysis, EKG, PT PTT,      and hyper coagulation profile.  4.  She will be started on subcu Lovenox and Coumadin per pharmacy.  5.  Synthroid 0.1 mg daily.  6.  Protonix 40 mg b.i.d.  7.  IV fluids with normal saline at 25 cc/hr.  This plan of care has been discussed with her and all her questions have  been answered.      Nolene Ebbs, M.D.  Electronically Signed     EA/MEDQ  D:  04/25/2005  T:  04/25/2005  Job:  [id number]

## 2010-11-10 NOTE — Op Note (Signed)
NAME:  [name], [name]            ACCOUNT NO.:  [id number]   MEDICAL RECORD NO.:  [id number]          PATIENT TYPE:  AMB   LOCATION:  SDS                          FACILITY:  [facility]   PHYSICIAN:  Judeth Cornfield. Scot Dock, M.D.DATE OF BIRTH:  10-22-1945   DATE OF PROCEDURE:  07/20/2005  DATE OF DISCHARGE:                                 OPERATIVE REPORT   PREOPERATIVE DIAGNOSIS:  Chronic renal failure.   POSTOPERATIVE DIAGNOSIS:  Chronic renal failure.   PROCEDURE:  Placement of new left forearm AV graft.   SURGEON:  Judeth Cornfield. Scot Dock, M.D.   ASSISTANT:  John Giovanni, P.A.-C.   ANESTHESIA:  Local with sedation.   TECHNIQUE:  The patient was taken to the operating room, sedated by  anesthesia. The left upper extremity was prepped and draped in the usual  sterile fashion. After the skin was infiltrated with 1% lidocaine a  transverse incision was made just above the antecubital level. Here the  cephalic vein was dissected free. It was extremely small, and she was not a  candidate for an upper arm fistula. The brachial artery and brachial vein  were dissected free beneath the fascia and both were felt to be adequate  size. The artery was slightly small as was the vein.   Using one distal counterincision a 4-to-7-mm, stretch, PTFE graft was  tunneled in a loop fashion in the forearm with the arterial aspect of the  graft along the ulnar aspect of the forearm; and the venous aspect of the  graft along the radial aspect of the forearm. The patient was then  heparinized. The brachial artery was clamped proximally and distally; and a  longitudinal arteriotomy was made.  A short segment of the 4 mm end of the  graft was excised. The graft was slightly spatulated; and sewn end-to-side  to the artery using continuous 6-0 Prolene suture. The graft was then pulled  to the appropriate length for anastomosis to the brachial vein. The vein had  been ligated distally and spatulated  proximally. It had been irrigated with  heparinized saline; and it did take a 5-mm dilator.   The graft was spatulated, cut to the appropriate length and sewn end-to-end  to the vein using continuous 6-0 Prolene suture. At the completion, there  was a good thrill in the fistula and good Doppler flow at the wrist.  Hemostasis was obtained of the wounds and the heparin was partially reversed  with protamine. The wounds were closed with a deep layer of 3-0 Vicryl; the  skin closed with 4-0 Vicryl. A sterile dressing was applied. The patient  tolerated the procedure well; and was transferred to the recovery room in  satisfactory condition. All needle and sponge counts were correct.     Judeth Cornfield. Scot Dock, M.D.  Electronically Signed    CSD/MEDQ  D:  07/20/2005  T:  07/20/2005  Job:  [id number]

## 2010-11-10 NOTE — Op Note (Signed)
[name], [name]            ACCOUNT NO.:  [id number]   MEDICAL RECORD NO.:  [id number]           PATIENT TYPE:   LOCATION:                                 FACILITY:   PHYSICIAN:  Rosetta Posner, M.D.         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   POSTOPERATIVE DIAGNOSIS:  End-stage renal disease with occluded right  forearm loop AV Gore-Tex graft.   PROCEDURE:  Thrombectomy interposition jump graft revision to basilic vein  of left forearm loop AV Gore-Tex graft.   ASSISTANT:  Nurse   ANESTHESIA:  MAC.   COMPLICATIONS:  None.   DISPOSITION:  Discharged to recovery room stable.   DESCRIPTION OF PROCEDURE:  The patient was taken to the operating room and  placed in the prone position where the left arm was prepped and draped in a  sterile fashion.  Incision made over the antecubital space using local  anesthesia, carried down to isolate the arterial and venous limbs of the  graft.  The venous anastomosis was end-to-end to the brachial vein.  The  vein was quite sclerotic in this area.  For this reason a separate incision  was made using local anesthesia in the above elbow medial aspect.  It was  carried down to isolate the basilic vein which was of good caliber.  Tunnel  was created from this basilic vein to the prior brachial vein anastomosis at  the antecubital space.   The graft was opened near the venous anastomosis; it was thrombectomized.  The arterialized plug was removed and excellent inflow was encountered.  This was flushed with heparinized saline and reoccluded.  New interposition  graft 7-mm Gore-Tex brought out onto the field.  It was spatulated and the  basilic vein was occluded proximally and distally with the 11-blade and sewn  down with 3 Potts scissors.  The graft was  then sewn end-to-side to the vein  with a running 6-0 Prolene suture.  Clamps were removed from the vein.  The  graft was flushed with heparinized and reoccluded.  Next, the interposition  graft was cut to appropriate length and was sewn end-to-end to the old graft  with a running 6-0 Prolene suture.  Clamps were removed; and an excellent  thrill was noted.  The wounds were irrigated with saline.  Hemostasis, was  obtained with electrocautery.  Wounds closed with 3-0 Vicryl in the  subcutaneous and subcuticular tissues.  Benzoin and Steri-Strips were  applied.     Rosetta Posner, M.D.  Electronically Signed    TFE/MEDQ  D:  09/23/2005  T:  09/24/2005  Job:  [id number]

## 2011-04-03 LAB — POTASSIUM: Potassium: 4.8

## 2011-04-12 LAB — LIPID PANEL
Cholesterol: 172
LDL Cholesterol: 113 — ABNORMAL HIGH
Triglycerides: 88
VLDL: 18

## 2011-04-12 LAB — CBC
HCT: 32.8 — ABNORMAL LOW
MCV: 92.4
Platelets: 221
RDW: 15.8 — ABNORMAL HIGH

## 2011-04-12 LAB — I-STAT 8, (EC8 V) (CONVERTED LAB)
Acid-Base Excess: 5 — ABNORMAL HIGH
Bicarbonate: 30.7 — ABNORMAL HIGH
Potassium: 5.1
TCO2: 32
pH, Ven: 7.414 — ABNORMAL HIGH

## 2011-04-12 LAB — DIFFERENTIAL
Basophils Absolute: 0
Eosinophils Absolute: 0.2
Eosinophils Relative: 2
Lymphs Abs: 1.8

## 2011-04-12 LAB — BASIC METABOLIC PANEL
BUN: 13
CO2: 32
Chloride: 96
Glucose, Bld: 113 — ABNORMAL HIGH
Potassium: 4.2

## 2011-04-12 LAB — POCT I-STAT CREATININE
Creatinine, Ser: 0.9
Operator id: 192351

## 2011-04-12 LAB — POCT CARDIAC MARKERS: Myoglobin, poc: >500

## 2011-04-12 LAB — TSH: TSH: 1.061

## 2011-04-12 LAB — CARDIAC PANEL(CRET KIN+CKTOT+MB+TROPI): Relative Index: 0.8

## 2011-04-12 LAB — D-DIMER, QUANTITATIVE: D-Dimer, Quant: 2.16 — ABNORMAL HIGH

## 2011-04-12 LAB — TROPONIN I: Troponin I: 0.02

## 2011-04-25 ENCOUNTER — Other Ambulatory Visit (HOSPITAL_COMMUNITY): Payer: Self-pay | Admitting: Internal Medicine

## 2011-04-25 DIAGNOSIS — Z1231 Encounter for screening mammogram for malignant neoplasm of breast: Secondary | ICD-10-CM

## 2011-04-27 ENCOUNTER — Ambulatory Visit (HOSPITAL_COMMUNITY): Payer: Medicare Other | Attending: Internal Medicine

## 2011-06-15 ENCOUNTER — Encounter: Payer: Medicare Other | Admitting: Obstetrics and Gynecology

## 2011-07-09 DIAGNOSIS — I714 Abdominal aortic aneurysm, without rupture: Secondary | ICD-10-CM | POA: Insufficient documentation

## 2011-07-10 ENCOUNTER — Telehealth: Payer: Self-pay | Admitting: *Deleted

## 2011-07-10 NOTE — Telephone Encounter (Signed)
Pt left message that she is not sure if she really needs the pelvic exam that she was referred for on 07/18/11. She will call her other doctor's office to see if it is necessary. She is also requesting a mammogram appt. I returned pt call and discussed her concerns. She had not been able to get through to her PCP office to ask her question. She stated that she had recent surgery for an aneurysm on 05/22/11 and wants to know if having the appt for pelvic exam next week will cause any problems. I stated that I did not think it would as her surgery will have been 2 months prior and she should be fully healed. I advised pt to keep the appt as scheduled and to discuss further with Dr. Hulan Fray at the time of her appt.  Pt voiced understanding. Pt also wanted to have her mammogram on the same day as she had missed her appt in November. I transferred her call to Radiology for appt scheduling

## 2011-07-14 ENCOUNTER — Emergency Department (HOSPITAL_COMMUNITY)
Admission: EM | Admit: 2011-07-14 | Discharge: 2011-07-14 | Disposition: A | Discharge status: Home / Self Care | Payer: Medicare Other | Attending: Emergency Medicine | Admitting: Emergency Medicine

## 2011-07-14 ENCOUNTER — Encounter (HOSPITAL_COMMUNITY): Payer: Self-pay | Admitting: *Deleted

## 2011-07-14 DIAGNOSIS — I10 Essential (primary) hypertension: Secondary | ICD-10-CM | POA: Diagnosis not present

## 2011-07-14 DIAGNOSIS — Z940 Kidney transplant status: Secondary | ICD-10-CM | POA: Diagnosis not present

## 2011-07-14 DIAGNOSIS — H5710 Ocular pain, unspecified eye: Secondary | ICD-10-CM | POA: Diagnosis not present

## 2011-07-14 DIAGNOSIS — H1589 Other disorders of sclera: Secondary | ICD-10-CM | POA: Diagnosis not present

## 2011-07-14 DIAGNOSIS — H5789 Other specified disorders of eye and adnexa: Secondary | ICD-10-CM | POA: Diagnosis not present

## 2011-07-14 DIAGNOSIS — H579 Unspecified disorder of eye and adnexa: Secondary | ICD-10-CM

## 2011-07-14 DIAGNOSIS — E119 Type 2 diabetes mellitus without complications: Secondary | ICD-10-CM | POA: Insufficient documentation

## 2011-07-14 HISTORY — DX: Essential (primary) hypertension: I10

## 2011-07-14 MED ORDER — TOBRAMYCIN 0.3 % OP OINT
TOPICAL_OINTMENT | Freq: Once | OPHTHALMIC | Status: AC
  Administered 2011-07-14: 22:00:00 via OPHTHALMIC
  Filled 2011-07-14: qty 3.5

## 2011-07-14 MED ORDER — TETRACAINE HCL 0.5 % OP SOLN
OPHTHALMIC | Status: AC
  Filled 2011-07-14: qty 2

## 2011-07-14 MED ORDER — HYDROCODONE-ACETAMINOPHEN 5-325 MG PO TABS: ORAL_TABLET | ORAL | Status: AC

## 2011-07-14 MED ORDER — TETRACAINE HCL 0.5 % OP SOLN
2.0000 [drp] | Freq: Once | OPHTHALMIC | Status: DC
  Filled 2011-07-14: qty 2

## 2011-07-14 MED ORDER — FLUORESCEIN SODIUM 1 MG OP STRP
ORAL_STRIP | OPHTHALMIC | Status: AC
  Filled 2011-07-14: qty 1

## 2011-07-14 MED ORDER — TETRACAINE HCL 0.5 % OP SOLN: 1.0000 [drp] | Freq: Once | OPHTHALMIC | Status: DC

## 2011-07-14 MED ORDER — HYDROCODONE-ACETAMINOPHEN 5-325 MG PO TABS
1.0000 | ORAL_TABLET | Freq: Once | ORAL | Status: AC
  Administered 2011-07-14: 1 via ORAL
  Filled 2011-07-14: qty 1

## 2011-07-14 MED ORDER — FLUORESCEIN SODIUM 1 MG OP STRP
1.0000 | ORAL_STRIP | Freq: Once | OPHTHALMIC | Status: DC
  Filled 2011-07-14: qty 1

## 2011-07-14 NOTE — ED Notes (Signed)
Discharge instructions reviewed;  Verbalizes understanding.  No questions asked; no further c/o's voiced;  Pt ambulatory to lobby.

## 2011-07-14 NOTE — ED Notes (Signed)
The pt has redness and irritation in her lt eye.  Last pm she had eye pain.  Today the eye has been draining and the eye has redness.  No pain .  The pt is a kidney transplant

## 2011-07-14 NOTE — ED Provider Notes (Signed)
History     CSN: [id number]  Arrival date & time 07/14/11  1643   First MD Initiated Contact with Patient 07/14/11 1902      Chief Complaint  Patient presents with  . Eye Problem    (Consider location/radiation/quality/duration/timing/severity/associated sxs/prior treatment) HPI Comments: Patient presents to Emergency Department with L eye pain. The pain began 2 days ago. Reports mild eye pain yesterday with mild, possibly purulent, discharge. After the fluid was cleared she states the pain began to dissipate. States she feels like a hair or something was in her eye. Today is less pain than yesterday.  Complains of redness, mild discharge, burning. She has not has any visual changes. Denies fever, headache, sinus pain, ear pain, nasal congestion. Prev hx of macular degeneration and cataracts.  She has taken benadryl and tylenol that has helped her eye pain.           Patient is a 66 y.o. female presenting with eye problem. The history is provided by the patient.  Eye Problem  This is a new problem. The current episode started 2 days ago. The problem has not changed since onset.There is pain in the left eye. There was no injury mechanism. There is no history of trauma to the eye. There is no known exposure to pink eye. She does not wear contacts. Associated symptoms include discharge and eye redness. Pertinent negatives include no blurred vision, no decreased vision, no nausea and no vomiting. She has tried nothing for the symptoms.    Past Medical History  Diagnosis Date  . Diabetes mellitus   . Hypertension   . Renal disorder     Past Surgical History  Procedure Date  . Kidney transplant   . Abdominal aortic aneurysm repair     History reviewed. No pertinent family history.  History  Substance Use Topics  . Smoking status: Never Smoker   . Smokeless tobacco: Not on file  . Alcohol Use: Yes    OB History    Grav Para Term Preterm Abortions TAB SAB Ect Mult Living            Review of Systems  Constitutional: Negative for fever and chills.  HENT: Negative for ear pain, congestion, sore throat, rhinorrhea, sinus pressure and tinnitus.   Eyes: Positive for pain, discharge and redness. Negative for blurred vision, itching and visual disturbance.  Respiratory: Negative for shortness of breath.   Cardiovascular: Negative for chest pain.  Gastrointestinal: Negative for nausea, vomiting, abdominal pain, diarrhea and constipation.  Genitourinary: Negative for dysuria and hematuria.  Musculoskeletal: Negative for myalgias.  Skin: Negative for rash.  Neurological: Negative for dizziness and headaches.  Psychiatric/Behavioral: Negative for confusion.    Allergies  Coumadin and Hectorol  Home Medications   Current Outpatient Rx  Name Route Sig Dispense Refill  . ALPRAZOLAM 0.5 MG PO TABS Oral Take 0.5 mg by mouth 2 (two) times daily as needed. For anxiety    . AMLODIPINE BESYLATE 10 MG PO TABS Oral Take 10 mg by mouth daily.    . ASPIRIN EC 81 MG PO TBEC Oral Take 81 mg by mouth every evening.    . FUROSEMIDE 40 MG PO TABS Oral Take 20 mg by mouth every other day.    Marland Kitchen GLIPIZIDE ER 2.5 MG PO TB24 Oral Take 2.5 mg by mouth daily.    Marland Kitchen LEVOTHYROXINE SODIUM 100 MCG PO TABS Oral Take 100 mcg by mouth daily.    Marland Kitchen MAGNESIUM GLUCONATE 30 MG PO TABS  Oral Take 15 mg by mouth daily.    . ADULT MULTIVITAMIN W/MINERALS CH Oral Take 1 tablet by mouth daily.    Marland Kitchen MYCOPHENOLATE MOFETIL 250 MG PO CAPS Oral Take 500 mg by mouth 2 (two) times daily.    Marland Kitchen OMEPRAZOLE 40 MG PO CPDR Oral Take 40 mg by mouth daily.    Marland Kitchen PREDNISONE 5 MG PO TABS Oral Take 5 mg by mouth daily.    . SULFAMETHOXAZOLE-TRIMETHOPRIM 400-80 MG PO TABS Oral Take 1 tablet by mouth every Monday, Wednesday, and Friday.    Marland Kitchen TACROLIMUS 5 MG PO CAPS Oral Take 5 mg by mouth 2 (two) times daily.      BP 118/56  Pulse 91  Temp(Src) 97.8 F (36.6 C) (Oral)  Resp 18  SpO2 99%  Physical Exam  Nursing  note and vitals reviewed. Constitutional: She is oriented to person, place, and time. She appears well-developed and well-nourished.  HENT:  Head: Normocephalic and atraumatic.  Right Ear: Tympanic membrane, external ear and ear canal normal.  Left Ear: Tympanic membrane, external ear and ear canal normal.  Nose: Nose normal.  Mouth/Throat: Oropharynx is clear and moist.  Eyes: Pupils are equal, round, and reactive to light. No foreign bodies found. Right eye exhibits no discharge. Left eye exhibits discharge. Left eye exhibits no hordeolum. Left conjunctiva is injected. Left conjunctiva has no hemorrhage. Left eye exhibits normal extraocular motion and no nystagmus.         Entire sclera injected. No periorbital edema or erythema. Patient has full range of motion of both eyes. There is no hyphema noted. Patient does not have pain in left eye when light is shined into right eye. Patient does not have considerable pain with movement of eyes in all directions. Tonometry checked. Left eye pressure was 18, 16. Fluorescein dye exam performed and does not reveal any corneal ulcerations or abrasions.  Neck: Normal range of motion. Neck supple.  Cardiovascular: Normal rate, regular rhythm and normal heart sounds.   Pulmonary/Chest: Effort normal and breath sounds normal. No respiratory distress. She has no wheezes.  Abdominal: Soft. There is no tenderness. There is no rebound and no guarding.  Musculoskeletal: Normal range of motion.  Lymphadenopathy:    She has no cervical adenopathy.  Neurological: She is alert and oriented to person, place, and time.  Skin: Skin is warm and dry. No rash noted.  Psychiatric: She has a normal mood and affect.    ED Course  Procedures (including critical care time)  Labs Reviewed - No data to display No results found.   1. Scleral injection    Patient was seen and examined. Tonometry and fluorescein dye exam performed. Patient was discussed with Dr. Kae Heller.  Dr. Kae Heller has seen patient. Will treat patient with antibiotic ointment and have patient followup with ophthalmologist. Patient urged to return with worsening pain, change in her vision, pain with movement of her eye, fever, swelling around her eye, or she has any other concerns. She verbalizes understanding and agrees with the plan. Patient prescribed pain medicine for home.  Patient counseled on use of narcotic pain medications. Counseled not to combine these medications with others containing tylenol. Urged not to drink alcohol, drive, or perform any other activities that requires focus while taking these medications. The patient verbalizes understanding and agrees with the plan.    MDM  Patients with mildly painful, red eye. Tonometry is normal, do not suspect glaucoma. Patient is not report any change in her baseline  vision. No corneal abrasion or ulceration noted. Do not suspect iritis given exam. There is no pain with light shined in opposite eye or ciliary flush noted on exam. There is no hyphema. There is no history of trauma. There is no concern for periorbital or orbital cellulitis.        Faustino Congress, [place] 07/15/11 [phone]

## 2011-07-14 NOTE — ED Notes (Addendum)
Incorrect pt in STR 2.  Checked [facility]. Papa was in waiting room. Franken went from Triage  to PDA 6

## 2011-07-16 DIAGNOSIS — H15009 Unspecified scleritis, unspecified eye: Secondary | ICD-10-CM | POA: Diagnosis not present

## 2011-07-16 DIAGNOSIS — H2510 Age-related nuclear cataract, unspecified eye: Secondary | ICD-10-CM | POA: Diagnosis not present

## 2011-07-16 DIAGNOSIS — H15049 Scleritis with corneal involvement, unspecified eye: Secondary | ICD-10-CM | POA: Diagnosis not present

## 2011-07-16 DIAGNOSIS — H5710 Ocular pain, unspecified eye: Secondary | ICD-10-CM | POA: Diagnosis not present

## 2011-07-18 ENCOUNTER — Encounter: Payer: Medicare Other | Admitting: Obstetrics & Gynecology

## 2011-07-20 DIAGNOSIS — H353190 Nonexudative age-related macular degeneration, unspecified eye, stage unspecified: Secondary | ICD-10-CM | POA: Diagnosis not present

## 2011-07-20 DIAGNOSIS — H15049 Scleritis with corneal involvement, unspecified eye: Secondary | ICD-10-CM | POA: Diagnosis not present

## 2011-07-25 NOTE — ED Provider Notes (Signed)
Evaluation and management procedures were performed by the PA/NP under my supervision/collaboration.    Charlena Cross, MD 07/25/11 [phone]

## 2011-07-30 DIAGNOSIS — E1169 Type 2 diabetes mellitus with other specified complication: Secondary | ICD-10-CM | POA: Insufficient documentation

## 2011-07-30 DIAGNOSIS — Z86718 Personal history of other venous thrombosis and embolism: Secondary | ICD-10-CM | POA: Diagnosis not present

## 2011-07-30 DIAGNOSIS — Z79899 Other long term (current) drug therapy: Secondary | ICD-10-CM | POA: Insufficient documentation

## 2011-07-30 DIAGNOSIS — E039 Hypothyroidism, unspecified: Secondary | ICD-10-CM | POA: Diagnosis not present

## 2011-07-30 DIAGNOSIS — E785 Hyperlipidemia, unspecified: Secondary | ICD-10-CM | POA: Diagnosis not present

## 2011-07-30 DIAGNOSIS — K219 Gastro-esophageal reflux disease without esophagitis: Secondary | ICD-10-CM | POA: Diagnosis not present

## 2011-07-30 DIAGNOSIS — I10 Essential (primary) hypertension: Secondary | ICD-10-CM | POA: Diagnosis not present

## 2011-07-30 DIAGNOSIS — Q619 Cystic kidney disease, unspecified: Secondary | ICD-10-CM | POA: Diagnosis not present

## 2011-07-30 DIAGNOSIS — Z940 Kidney transplant status: Secondary | ICD-10-CM | POA: Diagnosis not present

## 2011-07-30 DIAGNOSIS — D649 Anemia, unspecified: Secondary | ICD-10-CM | POA: Diagnosis not present

## 2011-08-08 ENCOUNTER — Ambulatory Visit (HOSPITAL_COMMUNITY): Payer: Medicare Other

## 2011-08-20 DIAGNOSIS — H15009 Unspecified scleritis, unspecified eye: Secondary | ICD-10-CM | POA: Diagnosis not present

## 2011-08-20 DIAGNOSIS — H353190 Nonexudative age-related macular degeneration, unspecified eye, stage unspecified: Secondary | ICD-10-CM | POA: Diagnosis not present

## 2011-08-20 DIAGNOSIS — H5710 Ocular pain, unspecified eye: Secondary | ICD-10-CM | POA: Diagnosis not present

## 2011-08-20 DIAGNOSIS — H538 Other visual disturbances: Secondary | ICD-10-CM | POA: Diagnosis not present

## 2011-08-25 DIAGNOSIS — F411 Generalized anxiety disorder: Secondary | ICD-10-CM | POA: Diagnosis not present

## 2011-08-25 DIAGNOSIS — I10 Essential (primary) hypertension: Secondary | ICD-10-CM | POA: Diagnosis not present

## 2011-08-25 DIAGNOSIS — E785 Hyperlipidemia, unspecified: Secondary | ICD-10-CM | POA: Diagnosis not present

## 2011-09-04 DIAGNOSIS — Z940 Kidney transplant status: Secondary | ICD-10-CM | POA: Diagnosis not present

## 2011-09-04 DIAGNOSIS — Z79899 Other long term (current) drug therapy: Secondary | ICD-10-CM | POA: Diagnosis not present

## 2011-09-04 DIAGNOSIS — Z48298 Encounter for aftercare following other organ transplant: Secondary | ICD-10-CM | POA: Diagnosis not present

## 2011-09-10 ENCOUNTER — Encounter: Payer: Medicare Other | Admitting: Family Medicine

## 2011-09-10 ENCOUNTER — Ambulatory Visit (HOSPITAL_COMMUNITY): Payer: Medicare Other

## 2011-10-12 ENCOUNTER — Ambulatory Visit (HOSPITAL_COMMUNITY)
Admission: RE | Admit: 2011-10-12 | Discharge: 2011-10-12 | Disposition: A | Discharge status: Home / Self Care | Payer: Medicaid Other | Source: Ambulatory Visit | Attending: Internal Medicine | Admitting: Internal Medicine

## 2011-10-12 ENCOUNTER — Encounter: Payer: Self-pay | Admitting: Obstetrics & Gynecology

## 2011-10-12 ENCOUNTER — Ambulatory Visit: Payer: Medicaid Other | Admitting: Obstetrics & Gynecology

## 2011-10-12 ENCOUNTER — Other Ambulatory Visit (HOSPITAL_COMMUNITY)
Admission: RE | Admit: 2011-10-12 | Discharge: 2011-10-12 | Disposition: A | Discharge status: Home / Self Care | Payer: Medicaid Other | Source: Ambulatory Visit | Attending: Obstetrics & Gynecology | Admitting: Obstetrics & Gynecology

## 2011-10-12 VITALS — BP 132/86 | HR 85 | Temp 97.5°F | Ht 68.5 in | Wt 233.2 lb

## 2011-10-12 DIAGNOSIS — Z01419 Encounter for gynecological examination (general) (routine) without abnormal findings: Secondary | ICD-10-CM | POA: Insufficient documentation

## 2011-10-12 DIAGNOSIS — Z1231 Encounter for screening mammogram for malignant neoplasm of breast: Secondary | ICD-10-CM | POA: Insufficient documentation

## 2011-10-12 DIAGNOSIS — Z0000 Encounter for general adult medical examination without abnormal findings: Secondary | ICD-10-CM

## 2011-10-12 NOTE — Progress Notes (Signed)
  Subjective:    Patient ID: [name], female    DOB: 1946/04/27, 65 y.o.   MRN: [id number]  HPI Patient overdue for Pap. Unsure about when her last one was. Our records indicate in 1998.  She reports having had many pervious Pap smears and denies any abnormal results.   Review of Systems     Objective:   Physical Exam Gen: NAD GU:   Vulva: normal   Vagina: significant vaginal wall prolapse   Cervix: pink, no active bleeding    Assessment & Plan:  Pap smear performed. Patient scheduled for mammogram today.   Follow-up as needed.

## 2011-10-26 ENCOUNTER — Telehealth: Payer: Self-pay | Admitting: *Deleted

## 2011-10-26 NOTE — Telephone Encounter (Signed)
Telephoned patient at home and spoke with patient. Advised pap smear and mammogram is normal. Patient voiced understanding.

## 2011-10-26 NOTE — Telephone Encounter (Signed)
Pt left a message requesting results from her last visit.

## 2011-11-13 DIAGNOSIS — Z940 Kidney transplant status: Secondary | ICD-10-CM | POA: Diagnosis not present

## 2011-11-13 DIAGNOSIS — Z79899 Other long term (current) drug therapy: Secondary | ICD-10-CM | POA: Diagnosis not present

## 2011-11-13 DIAGNOSIS — N281 Cyst of kidney, acquired: Secondary | ICD-10-CM | POA: Diagnosis not present

## 2012-02-28 DIAGNOSIS — E119 Type 2 diabetes mellitus without complications: Secondary | ICD-10-CM | POA: Diagnosis not present

## 2012-02-28 DIAGNOSIS — I10 Essential (primary) hypertension: Secondary | ICD-10-CM | POA: Diagnosis not present

## 2012-02-28 DIAGNOSIS — F411 Generalized anxiety disorder: Secondary | ICD-10-CM | POA: Diagnosis not present

## 2012-04-23 DIAGNOSIS — L03119 Cellulitis of unspecified part of limb: Secondary | ICD-10-CM | POA: Diagnosis not present

## 2012-04-23 DIAGNOSIS — L02419 Cutaneous abscess of limb, unspecified: Secondary | ICD-10-CM | POA: Diagnosis not present

## 2012-05-15 DIAGNOSIS — I10 Essential (primary) hypertension: Secondary | ICD-10-CM | POA: Diagnosis not present

## 2012-05-15 DIAGNOSIS — N181 Chronic kidney disease, stage 1: Secondary | ICD-10-CM | POA: Diagnosis not present

## 2012-05-15 DIAGNOSIS — R5383 Other fatigue: Secondary | ICD-10-CM | POA: Diagnosis not present

## 2012-05-15 DIAGNOSIS — R5381 Other malaise: Secondary | ICD-10-CM | POA: Diagnosis not present

## 2012-05-28 DIAGNOSIS — I129 Hypertensive chronic kidney disease with stage 1 through stage 4 chronic kidney disease, or unspecified chronic kidney disease: Secondary | ICD-10-CM | POA: Diagnosis not present

## 2012-05-28 DIAGNOSIS — Q619 Cystic kidney disease, unspecified: Secondary | ICD-10-CM | POA: Diagnosis not present

## 2012-05-28 DIAGNOSIS — R609 Edema, unspecified: Secondary | ICD-10-CM | POA: Diagnosis not present

## 2012-05-28 DIAGNOSIS — I120 Hypertensive chronic kidney disease with stage 5 chronic kidney disease or end stage renal disease: Secondary | ICD-10-CM | POA: Diagnosis not present

## 2012-05-28 DIAGNOSIS — E669 Obesity, unspecified: Secondary | ICD-10-CM | POA: Diagnosis not present

## 2012-05-28 DIAGNOSIS — Z940 Kidney transplant status: Secondary | ICD-10-CM | POA: Diagnosis not present

## 2012-05-28 DIAGNOSIS — N186 End stage renal disease: Secondary | ICD-10-CM | POA: Diagnosis not present

## 2012-05-28 DIAGNOSIS — E119 Type 2 diabetes mellitus without complications: Secondary | ICD-10-CM | POA: Diagnosis not present

## 2012-05-28 DIAGNOSIS — E785 Hyperlipidemia, unspecified: Secondary | ICD-10-CM | POA: Diagnosis not present

## 2012-05-28 DIAGNOSIS — Z79899 Other long term (current) drug therapy: Secondary | ICD-10-CM | POA: Diagnosis not present

## 2012-05-28 DIAGNOSIS — N189 Chronic kidney disease, unspecified: Secondary | ICD-10-CM | POA: Diagnosis not present

## 2012-06-09 DIAGNOSIS — L259 Unspecified contact dermatitis, unspecified cause: Secondary | ICD-10-CM | POA: Diagnosis not present

## 2012-06-09 DIAGNOSIS — L738 Other specified follicular disorders: Secondary | ICD-10-CM | POA: Diagnosis not present

## 2012-07-03 DIAGNOSIS — N189 Chronic kidney disease, unspecified: Secondary | ICD-10-CM | POA: Diagnosis not present

## 2012-08-05 ENCOUNTER — Other Ambulatory Visit: Payer: Self-pay | Admitting: Dermatology

## 2012-08-05 DIAGNOSIS — D485 Neoplasm of uncertain behavior of skin: Secondary | ICD-10-CM | POA: Diagnosis not present

## 2012-08-05 DIAGNOSIS — L738 Other specified follicular disorders: Secondary | ICD-10-CM | POA: Diagnosis not present

## 2012-08-05 DIAGNOSIS — L089 Local infection of the skin and subcutaneous tissue, unspecified: Secondary | ICD-10-CM | POA: Diagnosis not present

## 2012-08-14 DIAGNOSIS — E669 Obesity, unspecified: Secondary | ICD-10-CM | POA: Diagnosis not present

## 2012-08-14 DIAGNOSIS — Z79899 Other long term (current) drug therapy: Secondary | ICD-10-CM | POA: Diagnosis not present

## 2012-08-14 DIAGNOSIS — R609 Edema, unspecified: Secondary | ICD-10-CM | POA: Diagnosis not present

## 2012-08-14 DIAGNOSIS — I10 Essential (primary) hypertension: Secondary | ICD-10-CM | POA: Diagnosis not present

## 2012-08-14 DIAGNOSIS — Z940 Kidney transplant status: Secondary | ICD-10-CM | POA: Diagnosis not present

## 2012-08-14 DIAGNOSIS — E119 Type 2 diabetes mellitus without complications: Secondary | ICD-10-CM | POA: Diagnosis not present

## 2012-08-14 DIAGNOSIS — E876 Hypokalemia: Secondary | ICD-10-CM | POA: Diagnosis not present

## 2012-11-19 DIAGNOSIS — Z79899 Other long term (current) drug therapy: Secondary | ICD-10-CM | POA: Diagnosis not present

## 2013-01-13 DIAGNOSIS — L98499 Non-pressure chronic ulcer of skin of other sites with unspecified severity: Secondary | ICD-10-CM | POA: Diagnosis not present

## 2013-01-30 DIAGNOSIS — K7689 Other specified diseases of liver: Secondary | ICD-10-CM | POA: Diagnosis not present

## 2013-01-30 DIAGNOSIS — I714 Abdominal aortic aneurysm, without rupture: Secondary | ICD-10-CM | POA: Diagnosis not present

## 2013-03-13 DIAGNOSIS — L98499 Non-pressure chronic ulcer of skin of other sites with unspecified severity: Secondary | ICD-10-CM | POA: Diagnosis not present

## 2013-03-13 DIAGNOSIS — L03019 Cellulitis of unspecified finger: Secondary | ICD-10-CM | POA: Diagnosis not present

## 2013-03-13 DIAGNOSIS — M793 Panniculitis, unspecified: Secondary | ICD-10-CM | POA: Diagnosis not present

## 2013-03-13 DIAGNOSIS — L03039 Cellulitis of unspecified toe: Secondary | ICD-10-CM | POA: Diagnosis not present

## 2013-03-25 DIAGNOSIS — I10 Essential (primary) hypertension: Secondary | ICD-10-CM | POA: Diagnosis not present

## 2013-03-25 DIAGNOSIS — N186 End stage renal disease: Secondary | ICD-10-CM | POA: Diagnosis not present

## 2013-04-16 DIAGNOSIS — H6120 Impacted cerumen, unspecified ear: Secondary | ICD-10-CM | POA: Diagnosis not present

## 2013-04-16 DIAGNOSIS — H902 Conductive hearing loss, unspecified: Secondary | ICD-10-CM | POA: Diagnosis not present

## 2013-05-01 DIAGNOSIS — Z23 Encounter for immunization: Secondary | ICD-10-CM | POA: Diagnosis not present

## 2013-05-08 ENCOUNTER — Ambulatory Visit (INDEPENDENT_AMBULATORY_CARE_PROVIDER_SITE_OTHER): Payer: Medicare Other | Admitting: Family Medicine

## 2013-05-08 VITALS — BP 100/60 | HR 89 | Temp 98.5°F | Resp 18 | Ht 67.5 in | Wt 229.0 lb

## 2013-05-08 DIAGNOSIS — Z940 Kidney transplant status: Secondary | ICD-10-CM

## 2013-05-08 DIAGNOSIS — E119 Type 2 diabetes mellitus without complications: Secondary | ICD-10-CM

## 2013-05-08 DIAGNOSIS — E1140 Type 2 diabetes mellitus with diabetic neuropathy, unspecified: Secondary | ICD-10-CM | POA: Insufficient documentation

## 2013-05-08 DIAGNOSIS — M25579 Pain in unspecified ankle and joints of unspecified foot: Secondary | ICD-10-CM | POA: Diagnosis not present

## 2013-05-08 DIAGNOSIS — E1142 Type 2 diabetes mellitus with diabetic polyneuropathy: Secondary | ICD-10-CM | POA: Diagnosis not present

## 2013-05-08 DIAGNOSIS — E1149 Type 2 diabetes mellitus with other diabetic neurological complication: Secondary | ICD-10-CM

## 2013-05-08 DIAGNOSIS — G909 Disorder of the autonomic nervous system, unspecified: Secondary | ICD-10-CM | POA: Diagnosis not present

## 2013-05-08 DIAGNOSIS — M25572 Pain in left ankle and joints of left foot: Secondary | ICD-10-CM

## 2013-05-08 DIAGNOSIS — H547 Unspecified visual loss: Secondary | ICD-10-CM

## 2013-05-08 DIAGNOSIS — E1143 Type 2 diabetes mellitus with diabetic autonomic (poly)neuropathy: Secondary | ICD-10-CM

## 2013-05-08 LAB — COMPREHENSIVE METABOLIC PANEL
ALT: 34 U/L (ref 0–35)
Albumin: 4.5 g/dL (ref 3.5–5.2)
CO2: 23 mEq/L (ref 19–32)
Calcium: 10.4 mg/dL (ref 8.4–10.5)
Chloride: 104 mEq/L (ref 96–112)
Creat: 1.46 mg/dL — ABNORMAL HIGH (ref 0.50–1.10)
Potassium: 4.3 mEq/L (ref 3.5–5.3)
Sodium: 140 mEq/L (ref 135–145)
Total Protein: 7.8 g/dL (ref 6.0–8.3)

## 2013-05-08 LAB — POCT GLYCOSYLATED HEMOGLOBIN (HGB A1C): Hemoglobin A1C: 7.3

## 2013-05-08 LAB — POCT CBC
Granulocyte percent: 63.6 %G (ref 37–80)
HCT, POC: 40.6 % (ref 37.7–47.9)
Hemoglobin: 12.1 g/dL — AB (ref 12.2–16.2)
MCV: 94.6 fL (ref 80–97)
POC LYMPH PERCENT: 30.8 %L (ref 10–50)
RDW, POC: 15.9 %

## 2013-05-08 LAB — URIC ACID: Uric Acid, Serum: 10.4 mg/dL — ABNORMAL HIGH (ref 2.4–7.0)

## 2013-05-08 NOTE — Progress Notes (Signed)
Subjective: Patient has been having problems with pain in her left foot. She has numbness of her toes, but is able to feel things in them okay. The pain is at the front part of her heel bone. She knows of no injury, and has not gotten anything into the heel that she knows of. She has not had gout in the past. She does have a history of chronic kidney disease and renal transplant 4 years ago. She sees a nephrologist at [facility] ,she sees a vascular doctor gear he at [facility] also, and she sees a Dr. Arnoldo Morale family physician hearing [facility] She also has a regular dermatologist. She cheats on her diabetic diet frequently.  Has poor vision.  Has been told she has macular degeneration and early cataracts (5 yr ago).  I told her she needs to see an eye doctor.  Objective: Pleasant lady in no major distress. Her will feet grossly look okay. There is a minimal amount of erythema at the distal portion of the left heel medially. This area is tender. There is no apparent puncture wound. No mass or fullness could be appreciated. Her sensation is normal to the filament touch on the toes. Peripheral pulses are weak but I think I can barely feel them in each foot.  Assessment: Heel pain Type 2 diabetes mellitus Diabetic peripheral neuropathy, early History of renal transplant  Rule out gout. It would appear that she might just traumatize this a little bit somehow unknowingly.  Assessment: Plan: Check basic labs as well as uric acid  Results for orders placed in visit on 05/08/13  POCT CBC      Result Value Range   WBC 8.4  4.6 - 10.2 K/uL   Lymph, poc 2.6  0.6 - 3.4   POC LYMPH PERCENT 30.8  10 - 50 %L   MID (cbc) 0.5  0 - 0.9   POC MID % 5.6  0 - 12 %M   POC Granulocyte 5.3  2 - 6.9   Granulocyte percent 63.6  37 - 80 %G   RBC 4.26  4.04 - 5.48 M/uL   Hemoglobin 12.1 (*) 12.2 - 16.2 g/dL   HCT, POC 40.6  37.7 - 47.9 %   MCV 94.6  80 - 97 fL   MCH, POC 28.2  27 -  31.2 pg   MCHC 29.8 (*) 31.8 - 35.4 g/dL   RDW, POC 15.9     Platelet Count, POC 315  142 - 424 K/uL   MPV 9.1  0 - 99.8 fL  GLUCOSE, POCT (MANUAL RESULT ENTRY)      Result Value Range   POC Glucose 109 (*) 70 - 99 mg/dl  POCT GLYCOSYLATED HEMOGLOBIN (HGB A1C)      Result Value Range   Hemoglobin A1C 7.3     Patient desires a new primary care doctor. I told her that the people who except scheduled patient's it while for her not currently taking any Medicare to my understanding. She can ask for me if she shows up here for something, but pain doctors will need to continue being her kidney physicians at [facility]. I told her there are many family physicians in [place], and she could probably find one.  She is to try wearing a heel insert, but if you're he'll gets in all worse she is to come back in for further assessment.

## 2013-05-08 NOTE — Patient Instructions (Signed)
Wear a heel insert a new shoe  Give the heel pains a little more time. If it is not subsiding orthotists getting worse at anytime come back for recheck  I do recommend that you followup with an eye doctor for your poor vision.  Continue same diabetic regimen which is doing you fairly well. You need to do your part with being a little more careful with your eating.

## 2013-05-12 ENCOUNTER — Encounter: Payer: Self-pay | Admitting: *Deleted

## 2013-05-15 ENCOUNTER — Telehealth: Payer: Self-pay

## 2013-05-15 NOTE — Telephone Encounter (Signed)
Patient needs labs sent to her nephrologist at [facility] Dr Franchot Gallo. Ph [phone] and fax [phone]. Ok to send.

## 2013-05-18 NOTE — Telephone Encounter (Signed)
Faxed thru Epic

## 2013-06-08 DIAGNOSIS — E119 Type 2 diabetes mellitus without complications: Secondary | ICD-10-CM | POA: Diagnosis not present

## 2013-06-08 DIAGNOSIS — T8610 Unspecified complication of kidney transplant: Secondary | ICD-10-CM | POA: Diagnosis not present

## 2013-06-08 DIAGNOSIS — I10 Essential (primary) hypertension: Secondary | ICD-10-CM | POA: Diagnosis not present

## 2013-06-08 DIAGNOSIS — L259 Unspecified contact dermatitis, unspecified cause: Secondary | ICD-10-CM | POA: Diagnosis not present

## 2013-07-06 DIAGNOSIS — R1084 Generalized abdominal pain: Secondary | ICD-10-CM | POA: Diagnosis not present

## 2013-07-06 DIAGNOSIS — Z940 Kidney transplant status: Secondary | ICD-10-CM | POA: Diagnosis not present

## 2013-07-06 DIAGNOSIS — Z86010 Personal history of colonic polyps: Secondary | ICD-10-CM | POA: Diagnosis not present

## 2013-08-06 DIAGNOSIS — Z940 Kidney transplant status: Secondary | ICD-10-CM | POA: Diagnosis not present

## 2013-08-06 DIAGNOSIS — I10 Essential (primary) hypertension: Secondary | ICD-10-CM | POA: Diagnosis not present

## 2013-08-06 DIAGNOSIS — Z79899 Other long term (current) drug therapy: Secondary | ICD-10-CM | POA: Diagnosis not present

## 2013-08-06 DIAGNOSIS — K7689 Other specified diseases of liver: Secondary | ICD-10-CM | POA: Diagnosis not present

## 2013-08-06 DIAGNOSIS — E876 Hypokalemia: Secondary | ICD-10-CM | POA: Diagnosis not present

## 2013-08-06 DIAGNOSIS — K760 Fatty (change of) liver, not elsewhere classified: Secondary | ICD-10-CM | POA: Insufficient documentation

## 2013-08-20 ENCOUNTER — Encounter (HOSPITAL_COMMUNITY): Payer: Self-pay

## 2013-08-20 ENCOUNTER — Ambulatory Visit (HOSPITAL_COMMUNITY): Admit: 2013-08-20 | Payer: Medicaid Other | Admitting: Gastroenterology

## 2013-08-20 SURGERY — COLONOSCOPY
Anesthesia: Moderate Sedation

## 2013-09-01 DIAGNOSIS — I719 Aortic aneurysm of unspecified site, without rupture: Secondary | ICD-10-CM | POA: Diagnosis not present

## 2013-09-01 DIAGNOSIS — Z940 Kidney transplant status: Secondary | ICD-10-CM | POA: Diagnosis not present

## 2013-09-01 DIAGNOSIS — K769 Liver disease, unspecified: Secondary | ICD-10-CM | POA: Diagnosis not present

## 2013-09-07 ENCOUNTER — Other Ambulatory Visit (HOSPITAL_COMMUNITY): Payer: Self-pay | Admitting: Interventional Radiology

## 2013-09-07 DIAGNOSIS — I714 Abdominal aortic aneurysm, without rupture, unspecified: Secondary | ICD-10-CM

## 2013-09-08 DIAGNOSIS — L821 Other seborrheic keratosis: Secondary | ICD-10-CM | POA: Diagnosis not present

## 2013-09-08 DIAGNOSIS — L259 Unspecified contact dermatitis, unspecified cause: Secondary | ICD-10-CM | POA: Diagnosis not present

## 2013-09-08 DIAGNOSIS — L819 Disorder of pigmentation, unspecified: Secondary | ICD-10-CM | POA: Diagnosis not present

## 2013-09-08 DIAGNOSIS — L408 Other psoriasis: Secondary | ICD-10-CM | POA: Diagnosis not present

## 2013-09-23 ENCOUNTER — Other Ambulatory Visit (HOSPITAL_COMMUNITY): Payer: Self-pay | Admitting: Interventional Radiology

## 2013-09-23 ENCOUNTER — Inpatient Hospital Stay
Admission: RE | Admit: 2013-09-23 | Discharge: 2013-09-23 | Disposition: A | Discharge status: Home / Self Care | Payer: Self-pay | Source: Ambulatory Visit | Attending: Interventional Radiology | Admitting: Interventional Radiology

## 2013-09-23 ENCOUNTER — Ambulatory Visit
Admission: RE | Admit: 2013-09-23 | Discharge: 2013-09-23 | Disposition: A | Discharge status: Home / Self Care | Payer: Medicaid Other | Source: Ambulatory Visit | Attending: Interventional Radiology | Admitting: Interventional Radiology

## 2013-09-23 DIAGNOSIS — I714 Abdominal aortic aneurysm, without rupture, unspecified: Secondary | ICD-10-CM

## 2013-09-25 ENCOUNTER — Other Ambulatory Visit (HOSPITAL_COMMUNITY): Payer: Self-pay | Admitting: Interventional Radiology

## 2013-09-25 ENCOUNTER — Inpatient Hospital Stay
Admission: RE | Admit: 2013-09-25 | Discharge: 2013-09-25 | Disposition: A | Discharge status: Home / Self Care | Payer: Self-pay | Source: Ambulatory Visit | Attending: Interventional Radiology | Admitting: Interventional Radiology

## 2013-09-25 DIAGNOSIS — I714 Abdominal aortic aneurysm, without rupture, unspecified: Secondary | ICD-10-CM

## 2013-10-05 ENCOUNTER — Inpatient Hospital Stay
Admission: RE | Admit: 2013-10-05 | Discharge: 2013-10-05 | Disposition: A | Discharge status: Home / Self Care | Payer: Self-pay | Source: Ambulatory Visit | Attending: Interventional Radiology | Admitting: Interventional Radiology

## 2013-10-05 ENCOUNTER — Other Ambulatory Visit (HOSPITAL_COMMUNITY): Payer: Self-pay | Admitting: Interventional Radiology

## 2013-10-05 DIAGNOSIS — I714 Abdominal aortic aneurysm, without rupture, unspecified: Secondary | ICD-10-CM

## 2013-10-09 ENCOUNTER — Telehealth: Payer: Self-pay | Admitting: Emergency Medicine

## 2013-10-09 NOTE — Telephone Encounter (Signed)
DR Maryan Puls CALLED TO STATE THAT HE REVIEW'D PT MRI FROM  [facility] AND IS RECOMMENDING TO DO ANOTHER F/U MRI/MRA IN EARLY SEPT. 2015 AND VISIT.- HOLDING OFF ON DOING ANYTHING RIGHT NOW.- ( HE STILL HAS HER DISK )   CALLED PT WITH ABOVE INFORMATION AND SHE UNDERSTANDS.  SHE WILL JUST WAIT AND F/U HERE W/ DR Maryan Puls AND DO MRI.  I WILL CALL HER WHEN ITS CLOSER TO TIME AND I HAVE THEIR SCHEDULE.

## 2014-01-21 ENCOUNTER — Other Ambulatory Visit (HOSPITAL_COMMUNITY): Payer: Self-pay | Admitting: Interventional Radiology

## 2014-01-21 ENCOUNTER — Encounter: Payer: Self-pay | Admitting: Radiology

## 2014-01-21 DIAGNOSIS — Z8679 Personal history of other diseases of the circulatory system: Secondary | ICD-10-CM

## 2014-01-21 DIAGNOSIS — Z9889 Other specified postprocedural states: Principal | ICD-10-CM

## 2014-01-28 DIAGNOSIS — E669 Obesity, unspecified: Secondary | ICD-10-CM | POA: Diagnosis not present

## 2014-01-28 DIAGNOSIS — I120 Hypertensive chronic kidney disease with stage 5 chronic kidney disease or end stage renal disease: Secondary | ICD-10-CM | POA: Diagnosis not present

## 2014-01-28 DIAGNOSIS — E876 Hypokalemia: Secondary | ICD-10-CM | POA: Diagnosis not present

## 2014-01-28 DIAGNOSIS — I10 Essential (primary) hypertension: Secondary | ICD-10-CM | POA: Diagnosis not present

## 2014-01-28 DIAGNOSIS — N186 End stage renal disease: Secondary | ICD-10-CM | POA: Diagnosis not present

## 2014-01-28 DIAGNOSIS — E119 Type 2 diabetes mellitus without complications: Secondary | ICD-10-CM | POA: Diagnosis not present

## 2014-01-28 DIAGNOSIS — Z940 Kidney transplant status: Secondary | ICD-10-CM | POA: Diagnosis not present

## 2014-01-28 DIAGNOSIS — Z79899 Other long term (current) drug therapy: Secondary | ICD-10-CM | POA: Diagnosis not present

## 2014-02-24 ENCOUNTER — Ambulatory Visit (HOSPITAL_COMMUNITY): Admission: RE | Admit: 2014-02-24 | Payer: Medicare Other | Source: Ambulatory Visit

## 2014-02-24 ENCOUNTER — Ambulatory Visit (HOSPITAL_COMMUNITY)
Admission: RE | Admit: 2014-02-24 | Discharge: 2014-02-24 | Disposition: A | Discharge status: Home / Self Care | Payer: Medicare Other | Source: Ambulatory Visit | Attending: Interventional Radiology | Admitting: Interventional Radiology

## 2014-02-24 ENCOUNTER — Ambulatory Visit
Admission: RE | Admit: 2014-02-24 | Discharge: 2014-02-24 | Disposition: A | Discharge status: Home / Self Care | Payer: Medicare Other | Source: Ambulatory Visit | Attending: Interventional Radiology | Admitting: Interventional Radiology

## 2014-02-24 VITALS — BP 141/71 | HR 81 | Temp 98.3°F | Resp 14 | Ht 67.5 in | Wt 230.0 lb

## 2014-02-24 DIAGNOSIS — Z8679 Personal history of other diseases of the circulatory system: Secondary | ICD-10-CM

## 2014-02-24 DIAGNOSIS — K7689 Other specified diseases of liver: Secondary | ICD-10-CM | POA: Insufficient documentation

## 2014-02-24 DIAGNOSIS — I714 Abdominal aortic aneurysm, without rupture, unspecified: Secondary | ICD-10-CM | POA: Insufficient documentation

## 2014-02-24 DIAGNOSIS — K8020 Calculus of gallbladder without cholecystitis without obstruction: Secondary | ICD-10-CM | POA: Diagnosis not present

## 2014-02-24 DIAGNOSIS — D7389 Other diseases of spleen: Secondary | ICD-10-CM | POA: Diagnosis not present

## 2014-02-24 DIAGNOSIS — Z9889 Other specified postprocedural states: Secondary | ICD-10-CM | POA: Insufficient documentation

## 2014-02-24 DIAGNOSIS — N269 Renal sclerosis, unspecified: Secondary | ICD-10-CM | POA: Insufficient documentation

## 2014-02-24 DIAGNOSIS — Z940 Kidney transplant status: Secondary | ICD-10-CM | POA: Insufficient documentation

## 2014-03-05 DIAGNOSIS — I714 Abdominal aortic aneurysm, without rupture, unspecified: Secondary | ICD-10-CM | POA: Diagnosis not present

## 2014-03-05 DIAGNOSIS — Z9889 Other specified postprocedural states: Secondary | ICD-10-CM | POA: Diagnosis not present

## 2014-03-15 DIAGNOSIS — I714 Abdominal aortic aneurysm, without rupture, unspecified: Secondary | ICD-10-CM | POA: Diagnosis not present

## 2014-03-15 DIAGNOSIS — Z9889 Other specified postprocedural states: Secondary | ICD-10-CM | POA: Diagnosis not present

## 2014-05-13 DIAGNOSIS — Z79899 Other long term (current) drug therapy: Secondary | ICD-10-CM | POA: Diagnosis not present

## 2014-05-13 DIAGNOSIS — Z940 Kidney transplant status: Secondary | ICD-10-CM | POA: Diagnosis not present

## 2014-05-14 ENCOUNTER — Inpatient Hospital Stay (HOSPITAL_COMMUNITY)
Admission: EM | Admit: 2014-05-14 | Discharge: 2014-05-20 | DRG: 638 | Disposition: A | Discharge status: Home / Self Care | Payer: Medicare Other | Attending: Internal Medicine | Admitting: Internal Medicine

## 2014-05-14 ENCOUNTER — Emergency Department (HOSPITAL_COMMUNITY): Payer: Medicare Other

## 2014-05-14 ENCOUNTER — Encounter (HOSPITAL_COMMUNITY): Payer: Self-pay | Admitting: Family Medicine

## 2014-05-14 DIAGNOSIS — E872 Acidosis: Secondary | ICD-10-CM | POA: Diagnosis not present

## 2014-05-14 DIAGNOSIS — Z794 Long term (current) use of insulin: Secondary | ICD-10-CM

## 2014-05-14 DIAGNOSIS — E1010 Type 1 diabetes mellitus with ketoacidosis without coma: Secondary | ICD-10-CM | POA: Diagnosis not present

## 2014-05-14 DIAGNOSIS — R000 Tachycardia, unspecified: Secondary | ICD-10-CM | POA: Diagnosis present

## 2014-05-14 DIAGNOSIS — E0810 Diabetes mellitus due to underlying condition with ketoacidosis without coma: Secondary | ICD-10-CM | POA: Diagnosis not present

## 2014-05-14 DIAGNOSIS — Z86718 Personal history of other venous thrombosis and embolism: Secondary | ICD-10-CM | POA: Diagnosis not present

## 2014-05-14 DIAGNOSIS — E1310 Other specified diabetes mellitus with ketoacidosis without coma: Secondary | ICD-10-CM | POA: Diagnosis not present

## 2014-05-14 DIAGNOSIS — I10 Essential (primary) hypertension: Secondary | ICD-10-CM | POA: Diagnosis present

## 2014-05-14 DIAGNOSIS — E876 Hypokalemia: Secondary | ICD-10-CM | POA: Diagnosis not present

## 2014-05-14 DIAGNOSIS — Z940 Kidney transplant status: Secondary | ICD-10-CM | POA: Diagnosis not present

## 2014-05-14 DIAGNOSIS — E1169 Type 2 diabetes mellitus with other specified complication: Secondary | ICD-10-CM | POA: Diagnosis not present

## 2014-05-14 DIAGNOSIS — D696 Thrombocytopenia, unspecified: Secondary | ICD-10-CM | POA: Diagnosis not present

## 2014-05-14 DIAGNOSIS — Z6834 Body mass index (BMI) 34.0-34.9, adult: Secondary | ICD-10-CM | POA: Diagnosis not present

## 2014-05-14 DIAGNOSIS — E1140 Type 2 diabetes mellitus with diabetic neuropathy, unspecified: Secondary | ICD-10-CM | POA: Diagnosis present

## 2014-05-14 DIAGNOSIS — Z79899 Other long term (current) drug therapy: Secondary | ICD-10-CM

## 2014-05-14 DIAGNOSIS — R112 Nausea with vomiting, unspecified: Secondary | ICD-10-CM | POA: Diagnosis not present

## 2014-05-14 DIAGNOSIS — R079 Chest pain, unspecified: Secondary | ICD-10-CM | POA: Diagnosis not present

## 2014-05-14 DIAGNOSIS — E1110 Type 2 diabetes mellitus with ketoacidosis without coma: Secondary | ICD-10-CM | POA: Insufficient documentation

## 2014-05-14 DIAGNOSIS — Z7982 Long term (current) use of aspirin: Secondary | ICD-10-CM

## 2014-05-14 DIAGNOSIS — N186 End stage renal disease: Secondary | ICD-10-CM | POA: Diagnosis not present

## 2014-05-14 DIAGNOSIS — R918 Other nonspecific abnormal finding of lung field: Secondary | ICD-10-CM | POA: Diagnosis not present

## 2014-05-14 DIAGNOSIS — E039 Hypothyroidism, unspecified: Secondary | ICD-10-CM | POA: Diagnosis present

## 2014-05-14 DIAGNOSIS — Z7952 Long term (current) use of systemic steroids: Secondary | ICD-10-CM

## 2014-05-14 LAB — BASIC METABOLIC PANEL
ANION GAP: 42 — AB (ref 5–15)
ANION GAP: 31 — AB (ref 5–15)
ANION GAP: 27 — AB (ref 5–15)
BUN: 37 mg/dL — AB (ref 6–23)
BUN: 29 mg/dL — ABNORMAL HIGH (ref 6–23)
BUN: 27 mg/dL — ABNORMAL HIGH (ref 6–23)
CHLORIDE: 87 meq/L — AB (ref 96–112)
CHLORIDE: 109 meq/L (ref 96–112)
CHLORIDE: 110 meq/L (ref 96–112)
CO2: 6 meq/L — AB (ref 19–32)
CO2: 8 mEq/L — CL (ref 19–32)
CO2: 10 mEq/L — CL (ref 19–32)
CREATININE: 1.57 mg/dL — AB (ref 0.50–1.10)
Calcium: 10.5 mg/dL (ref 8.4–10.5)
Calcium: 9.6 mg/dL (ref 8.4–10.5)
Calcium: 9.5 mg/dL (ref 8.4–10.5)
Creatinine, Ser: 1.17 mg/dL — ABNORMAL HIGH (ref 0.50–1.10)
Creatinine, Ser: 1.12 mg/dL — ABNORMAL HIGH (ref 0.50–1.10)
GFR calc Af Amer: 39 mL/min — ABNORMAL LOW (ref >90)
GFR calc Af Amer: 58 mL/min — ABNORMAL LOW (ref >90)
GFR calc non Af Amer: 33 mL/min — ABNORMAL LOW (ref >90)
GFR calc non Af Amer: 47 mL/min — ABNORMAL LOW (ref >90)
GFR calc non Af Amer: 50 mL/min — ABNORMAL LOW (ref >90)
GFR, EST AFRICAN AMERICAN: 55 mL/min — AB (ref >90)
GLUCOSE: 646 mg/dL — AB (ref 70–99)
Glucose, Bld: 185 mg/dL — ABNORMAL HIGH (ref 70–99)
Glucose, Bld: 185 mg/dL — ABNORMAL HIGH (ref 70–99)
Potassium: 5 mEq/L (ref 3.7–5.3)
Potassium: 4.3 mEq/L (ref 3.7–5.3)
Potassium: 4.1 mEq/L (ref 3.7–5.3)
SODIUM: 148 meq/L — AB (ref 137–147)
Sodium: 135 mEq/L — ABNORMAL LOW (ref 137–147)
Sodium: 147 mEq/L (ref 137–147)

## 2014-05-14 LAB — I-STAT VENOUS BLOOD GAS, ED
Acid-base deficit: 17 mmol/L — ABNORMAL HIGH (ref 0.0–2.0)
Bicarbonate: 8.9 mEq/L — ABNORMAL LOW (ref 20.0–24.0)
O2 Saturation: 80 %
TCO2: 10 mmol/L (ref 0–100)
pCO2, Ven: 23.3 mmHg — ABNORMAL LOW (ref 45.0–50.0)
pH, Ven: 7.188 — CL (ref 7.250–7.300)
pO2, Ven: 53 mmHg — ABNORMAL HIGH (ref 30.0–45.0)

## 2014-05-14 LAB — MAGNESIUM: Magnesium: 2.5 mg/dL (ref 1.5–2.5)

## 2014-05-14 LAB — URINE MICROSCOPIC-ADD ON

## 2014-05-14 LAB — URINALYSIS, ROUTINE W REFLEX MICROSCOPIC
Bilirubin Urine: NEGATIVE
Ketones, ur: >80 mg/dL — AB
Leukocytes, UA: NEGATIVE
Nitrite: NEGATIVE
PH: 5 (ref 5.0–8.0)
Protein, ur: NEGATIVE mg/dL
SPECIFIC GRAVITY, URINE: 1.025 (ref 1.005–1.030)
Urobilinogen, UA: 0.2 mg/dL (ref 0.0–1.0)

## 2014-05-14 LAB — CBC
HEMATOCRIT: 44 % (ref 36.0–46.0)
HEMOGLOBIN: 14.6 g/dL (ref 12.0–15.0)
MCH: 29.2 pg (ref 26.0–34.0)
MCHC: 33.2 g/dL (ref 30.0–36.0)
MCV: 88 fL (ref 78.0–100.0)
Platelets: 283 10*3/uL (ref 150–400)
RBC: 5 MIL/uL (ref 3.87–5.11)
RDW: 14.7 % (ref 11.5–15.5)
WBC: 7.7 10*3/uL (ref 4.0–10.5)

## 2014-05-14 LAB — GLUCOSE, CAPILLARY
GLUCOSE-CAPILLARY: 300 mg/dL — AB (ref 70–99)
GLUCOSE-CAPILLARY: 246 mg/dL — AB (ref 70–99)
Glucose-Capillary: 151 mg/dL — ABNORMAL HIGH (ref 70–99)
Glucose-Capillary: 181 mg/dL — ABNORMAL HIGH (ref 70–99)
Glucose-Capillary: 185 mg/dL — ABNORMAL HIGH (ref 70–99)

## 2014-05-14 LAB — CBG MONITORING, ED
GLUCOSE-CAPILLARY: 360 mg/dL — AB (ref 70–99)
Glucose-Capillary: 449 mg/dL — ABNORMAL HIGH (ref 70–99)

## 2014-05-14 LAB — PRO B NATRIURETIC PEPTIDE: Pro B Natriuretic peptide (BNP): 258.6 pg/mL — ABNORMAL HIGH (ref 0–125)

## 2014-05-14 LAB — PHOSPHORUS: Phosphorus: 1.7 mg/dL — ABNORMAL LOW (ref 2.3–4.6)

## 2014-05-14 LAB — TROPONIN I: Troponin I: <0.3 ng/mL (ref <0.30)

## 2014-05-14 LAB — MRSA PCR SCREENING: MRSA BY PCR: NEGATIVE

## 2014-05-14 LAB — I-STAT TROPONIN, ED: Troponin i, poc: 0 ng/mL (ref 0.00–0.08)

## 2014-05-14 LAB — TSH: TSH: 0.06 u[IU]/mL — ABNORMAL LOW (ref 0.350–4.500)

## 2014-05-14 MED ORDER — SODIUM CHLORIDE 0.9 % IV SOLN
1000.0000 mL | Freq: Once | INTRAVENOUS | Status: AC
  Administered 2014-05-14: 1000 mL via INTRAVENOUS

## 2014-05-14 MED ORDER — DEXTROSE-NACL 5-0.45 % IV SOLN
INTRAVENOUS | Status: DC
  Administered 2014-05-14 – 2014-05-15 (×3): via INTRAVENOUS
  Administered 2014-05-15: 75 mL/h via INTRAVENOUS
  Administered 2014-05-16: 01:00:00 via INTRAVENOUS
  Administered 2014-05-16: 125 mL/h via INTRAVENOUS
  Administered 2014-05-17: 22:00:00 via INTRAVENOUS
  Administered 2014-05-17: 125 mL/h via INTRAVENOUS
  Administered 2014-05-18 (×2): via INTRAVENOUS

## 2014-05-14 MED ORDER — ONDANSETRON HCL 4 MG/2ML IJ SOLN
4.0000 mg | Freq: Four times a day (QID) | INTRAMUSCULAR | Status: DC | PRN
  Administered 2014-05-14 – 2014-05-18 (×7): 4 mg via INTRAVENOUS
  Filled 2014-05-14 (×7): qty 2

## 2014-05-14 MED ORDER — SODIUM CHLORIDE 0.9 % IV SOLN
INTRAVENOUS | Status: DC
  Administered 2014-05-14: 5.4 [IU]/h via INTRAVENOUS
  Filled 2014-05-14: qty 2.5

## 2014-05-14 MED ORDER — DEXTROSE-NACL 5-0.45 % IV SOLN: INTRAVENOUS | Status: DC

## 2014-05-14 MED ORDER — SODIUM CHLORIDE 0.9 % IV SOLN
INTRAVENOUS | Status: DC
  Administered 2014-05-15: 02:00:00 via INTRAVENOUS

## 2014-05-14 MED ORDER — SODIUM CHLORIDE 0.9 % IV SOLN: INTRAVENOUS | Status: AC

## 2014-05-14 MED ORDER — ONDANSETRON HCL 4 MG/2ML IJ SOLN
4.0000 mg | Freq: Once | INTRAMUSCULAR | Status: AC
  Administered 2014-05-14: 4 mg via INTRAVENOUS
  Filled 2014-05-14: qty 2

## 2014-05-14 MED ORDER — SODIUM CHLORIDE 0.9 % IV SOLN
1000.0000 mL | INTRAVENOUS | Status: DC
  Administered 2014-05-14: 1000 mL via INTRAVENOUS

## 2014-05-14 MED ORDER — SODIUM CHLORIDE 0.9 % IV SOLN
INTRAVENOUS | Status: DC
  Administered 2014-05-14: 4.8 [IU]/h via INTRAVENOUS
  Administered 2014-05-15: 2.3 [IU]/h via INTRAVENOUS
  Administered 2014-05-16: 2.8 [IU]/h via INTRAVENOUS
  Administered 2014-05-16: 5.9 [IU]/h via INTRAVENOUS
  Administered 2014-05-16: 3.5 [IU]/h via INTRAVENOUS
  Administered 2014-05-16: 3.4 [IU]/h via INTRAVENOUS
  Administered 2014-05-17: 1.8 [IU]/h via INTRAVENOUS
  Administered 2014-05-17: 4.1 [IU]/h via INTRAVENOUS
  Administered 2014-05-17: 2.5 [IU]/h via INTRAVENOUS
  Administered 2014-05-17: 6.6 [IU]/h via INTRAVENOUS
  Administered 2014-05-17: 5.6 [IU]/h via INTRAVENOUS
  Filled 2014-05-14: qty 2.5

## 2014-05-14 MED ORDER — PROMETHAZINE HCL 25 MG/ML IJ SOLN
12.5000 mg | Freq: Four times a day (QID) | INTRAMUSCULAR | Status: DC | PRN
  Administered 2014-05-14 – 2014-05-17 (×5): 12.5 mg via INTRAVENOUS
  Filled 2014-05-14 (×6): qty 1

## 2014-05-14 NOTE — ED Provider Notes (Signed)
CSN: [id number]     Arrival date & time 05/14/14  1214 History   First MD Initiated Contact with Patient 05/14/14 1313     Chief Complaint  Patient presents with  . Chest Pain  . Emesis    HPI: [name] is a 68 year old African American female with PMH of Type II DM, HTN, HLD, Hypothyroidism, GERD, and Kidney Disease (s/p transplant; followed by Dr. Verdis Frederickson in [place]) presenting to the ED today for nausea, vomiting, increased thirst, and chest pain over the past 2-3 weeks. Patient reports the nausea and vomiting have been consistent over the past few weeks and says her vomit has been dark in color. She says she has not had an appetite over this time frame as well. Due to decreased intake of food, she says this had led to her feeling "weaker than normal". She reports having increased thirst over the past few weeks and has been consuming 4+ glasses of water per day. She also endorses shortness of breath with exertion, such as walking around her house and says her symptoms improve when she stops and rests for a minute.   She says her chest pain is mainly located along her sternum and epigastric area and is increased after eating. She says this pain is the usual "heartburn" she experiences but says her Protonix has not been working as well as it usually does in controlling her symptoms.She reports having cramps along her thighs over the past several weeks. She describes it as a deep ache and says the pain can last up to several hours. She says nothing makes the pain better or worse and says she has experienced this in the past when her "magnesium was too low".She denies any fever, chills, headaches, syncope, changes in vision, palpitations, radiating pain, productive cough, wheezing, diarrhea, constipation, or urinary changes.  The patient says she does not have a PCP and says her diabetic medications are currently being managed by her renal specialist. According to the patient, her renal  specialist is the person whom suggested she come to the ED today to be evaluated. She says she has never been on insulin and currently only takes Glipizide for her diabetes. She says she has an appointment on the 24th to get established with a PCP at [facility] in [place], [place] but is unsure of who her provider will be.  Past Medical History  Diagnosis Date  . Diabetes mellitus   . Hypertension   . Renal disorder   . Morbid obesity     BMI 34  . Allergy   . Blood transfusion without reported diagnosis   . Cataract   . Clotting disorder   . Thyroid disease   . Ulcer    Past Surgical History  Procedure Laterality Date  . Kidney transplant    . Abdominal aortic aneurysm repair     History reviewed. No pertinent family history. History  Substance Use Topics  . Smoking status: Never Smoker   . Smokeless tobacco: Not on file  . Alcohol Use: Yes   OB History    Gravida Para Term Preterm AB TAB SAB Ectopic Multiple Living   3 3 3       3      Review of Systems: All other systems negative except as documented in the HPI. All pertinent positives and negatives as reviewed in the HPI.    Allergies  Coumadin and Hectorol  Home Medications   Prior to Admission medications   Medication  Sig Start Date End Date Taking? Authorizing Provider  ALPRAZolam Duanne Moron) 0.5 MG tablet Take 0.5 mg by mouth 2 (two) times daily as needed. For anxiety    Historical Provider, MD  amLODipine (NORVASC) 10 MG tablet Take 10 mg by mouth daily.    Historical Provider, MD  aspirin EC 81 MG tablet Take 81 mg by mouth every evening.    Historical Provider, MD  furosemide (LASIX) 40 MG tablet Take 80 mg by mouth daily.     Historical Provider, MD  glipiZIDE (GLUCOTROL XL) 2.5 MG 24 hr tablet Take 2.5 mg by mouth daily.    Historical Provider, MD  levothyroxine (SYNTHROID, LEVOTHROID) 100 MCG tablet Take 100 mcg by mouth daily.    Historical Provider, MD  magnesium gluconate (MAGONATE) 30 MG tablet  Take 15 mg by mouth daily.    Historical Provider, MD  Multiple Vitamin (MULITIVITAMIN WITH MINERALS) TABS Take 1 tablet by mouth daily.    Historical Provider, MD  mycophenolate (CELLCEPT) 250 MG capsule Take 500 mg by mouth 2 (two) times daily.    Historical Provider, MD  omeprazole (PRILOSEC) 40 MG capsule Take 40 mg by mouth daily.    Historical Provider, MD  pantoprazole (PROTONIX) 20 MG tablet Take 20 mg by mouth daily.    Historical Provider, MD  predniSONE (DELTASONE) 5 MG tablet Take 5 mg by mouth daily.    Historical Provider, MD  sulfamethoxazole-trimethoprim (BACTRIM,SEPTRA) 400-80 MG per tablet Take 1 tablet by mouth every Monday, Wednesday, and Friday.    Historical Provider, MD  tacrolimus (PROGRAF) 5 MG capsule Take 5 mg by mouth 2 (two) times daily.    Historical Provider, MD   BP 139/77 mmHg  Pulse 117  Temp(Src) 97.9 F (36.6 C) (Oral)  Resp 17  Ht 5' 7.5" (1.715 m)  SpO2 97% Physical Exam  Constitutional: She is oriented to person, place, and time. She appears well-developed and well-nourished.  HENT:  Head: Normocephalic and atraumatic.  Mouth/Throat: Oropharynx is clear and moist. No oropharyngeal exudate.  Eyes: Conjunctivae are normal. Pupils are equal, round, and reactive to light.  Neck: Normal range of motion. No JVD present.  Cardiovascular: Normal rate, regular rhythm, normal heart sounds and intact distal pulses.  Exam reveals no gallop and no friction rub.   No murmur heard. Pulmonary/Chest: Effort normal and breath sounds normal. No stridor. No respiratory distress. She has no wheezes. She has no rales. She exhibits no tenderness.  Abdominal: Soft. Bowel sounds are normal. She exhibits no distension and no mass. There is tenderness. There is no rebound and no guarding.  Tenderness to palpation in epigastrium.  Musculoskeletal: Normal range of motion. She exhibits tenderness. She exhibits no edema.  Tenderness noted along patient's anterior thighs  bilaterally.  Lymphadenopathy:    She has no cervical adenopathy.  Neurological: She is alert and oriented to person, place, and time.  Skin: Skin is warm. No rash noted. No erythema. No pallor.  Xerosis noted along lower extremities bilaterally.  Nursing note and vitals reviewed.   ED Course  Procedures (including critical care time) Labs Review Labs Reviewed  BASIC METABOLIC PANEL - Abnormal; Notable for the following:    Sodium 135 (*)    Chloride 87 (*)    CO2 6 (*)    Glucose, Bld 646 (*)    BUN 37 (*)    Creatinine, Ser 1.57 (*)    GFR calc non Af Amer 33 (*)    GFR calc Af Amer 39 (*)  Anion gap 42 (*)    All other components within normal limits  PRO B NATRIURETIC PEPTIDE - Abnormal; Notable for the following:    Pro B Natriuretic peptide (BNP) 258.6 (*)    All other components within normal limits  I-STAT VENOUS BLOOD GAS, ED - Abnormal; Notable for the following:    pH, Ven 7.188 (*)    pCO2, Ven 23.3 (*)    pO2, Ven 53.0 (*)    Bicarbonate 8.9 (*)    Acid-base deficit 17.0 (*)    All other components within normal limits  CBC  I-STAT TROPOININ, ED    Imaging Review Dg Chest 2 View  05/14/2014   CLINICAL DATA:  Initial encounter for three-week history of mid chest pain with abdominal pain.  EXAM: CHEST  2 VIEW  COMPARISON:  Chest x-ray from 11/29/2006.  CT chest from 11/29/2006.  FINDINGS: Lungs are hyperexpanded without edema or focal airspace consolidation. No pleural effusion. 10 mm nodule overlies the lateral left lung base in review of the previous CT reveals no calcified granuloma present at that time. The cardiopericardial silhouette is within normal limits for size. Imaged bony structures of the thorax are intact.  IMPRESSION: No acute cardiopulmonary process.  10 mm nodular density overlying the left base on the frontal film is probably a nipple shadow given the appearance of the nipple on the lateral projection. Repeat frontal radiograph with nipple  markers could confirm.   Electronically Signed   By: Misty Stanley M.D.   On: 05/14/2014 13:43     EKG Interpretation   Date/Time:  Friday May 14 2014 12:23:15 EST Ventricular Rate:  117 PR Interval:  154 QRS Duration: 76 QT Interval:  352 QTC Calculation: 491 R Axis:   -32 Text Interpretation:  Sinus tachycardia Left axis deviation Left  ventricular hypertrophy No significant change since last tracing Confirmed  by Maryan Rued  MD, Loree Fee ([id number]) on 05/14/2014 1:24:32 PM     Patient will need to be admitted to the hospital for DKA, spoke with the family practice resident who will be in to admit the patient MDM   Final diagnoses:  Chest pain    CRITICAL CARE Performed by: Brent General Total critical care time: 45 minutes Critical care time was exclusive of separately billable procedures and treating other patients. Critical care was necessary to treat or prevent imminent or life-threatening deterioration. Critical care was time spent personally by me on the following activities: development of treatment plan with patient and/or surrogate as well as nursing, discussions with consultants, evaluation of patient's response to treatment, examination of patient, obtaining history from patient or surrogate, ordering and performing treatments and interventions, ordering and review of laboratory studies, ordering and review of radiographic studies, pulse oximetry and re-evaluation of patient's condition.  Patient is in DKA with a pH of 7.1, and respirations are noted at 24.  Patient is not comatose or unresponsive glucostabilizer and IV fluids have been initiated.  Brent General, PA-C 05/18/14 [id number]  Blanchie Dessert, MD 05/24/14 [phone]

## 2014-05-14 NOTE — H&P (Signed)
Date: 05/14/2014               Patient Name:  [name] MRN: [id number]  DOB: 10/08/1945 Age / Sex: 68 y.o., female   PCP: No primary care provider on file.         Medical Service: Internal Medicine Teaching Service         Attending Physician: Dr. Aldine Contes, MD    First Contact: Dr. Marvel Plan Pager: [phone]  Second Contact: Dr. Ronnald Ramp Pager: [phone]       After Hours (After 5p/  First Contact Pager: [phone]  weekends / holidays): Second Contact Pager: [phone]   Chief Complaint: nausea and vomiting  History of Present Illness: Ms. Cappo is a 68 yo female with PMHx of DM, HTN, kidney transplant in 2010, DVT s/p IVC filter placement who presents to the ED with complaint of nausea, vomiting and dizziness. Patient states she has been increasingly more dizzy and nauseous over the past 2 weeks. Patient states that her nausea has been accompanied by non-bloody emesis. She admits to weakness, loss of appetite, muscle aches, shortness of breath and fatigue. Patient also admits to increased thirst, polydipsia and polyuria. Patient has stopped taking her medications for about one week due to nausea and vomiting. She hasn't been able to keep anything down. Patient denies fever, chills.   Meds: Medications Prior to Admission  Medication Sig Dispense Refill  . amLODipine (NORVASC) 10 MG tablet Take 10 mg by mouth at bedtime.     Marland Kitchen aspirin EC 81 MG tablet Take 81 mg by mouth every evening.    . furosemide (LASIX) 40 MG tablet Take 40 mg by mouth daily.    Marland Kitchen glipiZIDE (GLUCOTROL XL) 2.5 MG 24 hr tablet Take 2.5 mg by mouth daily.    Marland Kitchen levothyroxine (SYNTHROID, LEVOTHROID) 100 MCG tablet Take 100 mcg by mouth daily.    . Magnesium 500 MG TABS Take 500 mg by mouth daily.    . Multiple Vitamin (MULTIVITAMIN WITH MINERALS) TABS tablet Take 1 tablet by mouth daily.    . mycophenolate (CELLCEPT) 250 MG capsule Take 500 mg by mouth 2 (two) times daily.    . pantoprazole (PROTONIX) 40  MG tablet Take 40 mg by mouth daily.    . potassium chloride (KLOR-CON) 8 MEQ tablet Take 8 mEq by mouth daily.    . pravastatin (PRAVACHOL) 40 MG tablet Take 40 mg by mouth every evening.    . predniSONE (DELTASONE) 5 MG tablet Take 5 mg by mouth daily.    . sodium bicarbonate 650 MG tablet Take 650 mg by mouth as needed for heartburn.    . sulfamethoxazole-trimethoprim (BACTRIM,SEPTRA) 400-80 MG per tablet Take 1 tablet by mouth every Monday, Wednesday, and Friday.    . tacrolimus (PROGRAF) 0.5 MG capsule Take 0.5 mg by mouth 2 (two) times daily. Takes total of 4.5mg  twice daily    . tacrolimus (PROGRAF) 1 MG capsule Take 4 mg by mouth 2 (two) times daily. Takes 4.5mg  total twice daily      Current Facility-Administered Medications  Medication Dose Route Frequency Provider Last Rate Last Dose  . 0.9 %  sodium chloride infusion  1,000 mL Intravenous Continuous Resa Miner Lawyer, PA-C 125 mL/hr at 05/14/14 1501 1,000 mL at 05/14/14 1501  . 0.9 %  sodium chloride infusion   Intravenous Continuous Corky Sox, MD      . 0.9 %  sodium chloride infusion   Intravenous Continuous Corky Sox,  MD      . dextrose 5 %-0.45 % sodium chloride infusion   Intravenous Continuous Corky Sox, MD      . insulin regular (NOVOLIN R,HUMULIN R) 250 Units in sodium chloride 0.9 % 250 mL (1 Units/mL) infusion   Intravenous Continuous Corky Sox, MD 4.8 mL/hr at 05/14/14 1814 4.8 Units/hr at 05/14/14 1814  . ondansetron (ZOFRAN) injection 4 mg  4 mg Intravenous Q6H PRN Corky Sox, MD        Allergies: Allergies as of 05/14/2014 - Review Complete 05/14/2014  Allergen Reaction Noted  . Hectorol [doxercalciferol] Anaphylaxis and Shortness Of Breath 07/14/2011  . Vitamin d analogs Anaphylaxis and Shortness Of Breath 05/14/2014  . Coumadin [warfarin sodium] Other (See Comments) 07/14/2011   Past Medical History  Diagnosis Date  . Diabetes mellitus   . Hypertension   . Renal disorder   . Morbid obesity      BMI 34  . Allergy   . Blood transfusion without reported diagnosis   . Cataract   . Clotting disorder   . Thyroid disease   . Ulcer    Past Surgical History  Procedure Laterality Date  . Kidney transplant    . Abdominal aortic aneurysm repair     History reviewed. No pertinent family history. History   Social History  . Marital Status: Divorced    Spouse Name: N/A    Number of Children: N/A  . Years of Education: N/A   Occupational History  . Not on file.   Social History Main Topics  . Smoking status: Never Smoker   . Smokeless tobacco: Not on file  . Alcohol Use: Yes  . Drug Use: Not on file  . Sexual Activity: Not on file   Other Topics Concern  . Not on file   Social History Narrative    Review of Systems: General: Admits to decreased appetite and fatigue. Denies fever, chills, and diaphoresis.  Respiratory: Admits to SOB, Denies cough, DOE, chest tightness, and wheezing.   Cardiovascular: Denies chest pain and palpitations.  Gastrointestinal: Admits to nausea, vomiting. Denies abdominal pain, diarrhea, constipation, blood in stool and abdominal distention.  Genitourinary: Admits to dysuria, urgency, hematuria, suprapubic pain and flank pain. Endocrine: Admits to polyuria, and polydipsia. Musculoskeletal: Admits to myalgias, Denies back pain, joint swelling, arthralgias and gait problem.  Skin: Denies pallor, rash and wounds.  Neurological: Admits to dizziness, weakness, lightheadedness, Denies numbness,seizures, and syncope, Psychiatric/Behavioral: Denies mood changes, confusion, nervousness, sleep disturbance and agitation.  Physical Exam: Filed Vitals:   05/14/14 1600 05/14/14 1630 05/14/14 1700 05/14/14 1800  BP: 165/85 170/90 158/81 152/84  Pulse: 114 114 113 112  Temp:    98 F (36.7 C)  TempSrc:    Oral  Resp: 20 19 23 22   Height:    5\' 7"  (1.702 m)  Weight:    93.1 kg (205 lb 4 oz)  SpO2: 100% 100% 100% 100%   General: Vital signs  reviewed.  Patient is well-developed and well-nourished, in mild distress and cooperative with exam.  HEENT: Dry mucous membranes. Missing teeth. Ketone breath. Cardiovascular: Tachycardic, regular rate, S1 normal, S2 normal, no murmurs, gallops, or rubs. Pulmonary/Chest: Clear to auscultation bilaterally, no wheezes, rales, or rhonchi. Abdominal: Soft, non-tender, obese, non-distended, BS +, no masses, organomegaly, or guarding present.  Musculoskeletal: No joint deformities, erythema, or stiffness, ROM full and nontender. Extremities: No lower extremity edema bilaterally,  pulses symmetric and intact bilaterally. No cyanosis or clubbing. Neurological: A&O x3  Skin: Increased skin turgor.  Psychiatric: Normal mood and affect. speech and behavior is normal. Cognition and memory are normal.   Lab results: Basic Metabolic Panel:  Recent Labs  05/14/14 1230  NA 135*  K 5.0  CL 87*  CO2 6*  GLUCOSE 646*  BUN 37*  CREATININE 1.57*  CALCIUM 10.5   CBC:  Recent Labs  05/14/14 1230  WBC 7.7  HGB 14.6  HCT 44.0  MCV 88.0  PLT 283   BNP:  Recent Labs  05/14/14 1230  PROBNP 258.6*   CBG:  Recent Labs  05/14/14 1550 05/14/14 1653 05/14/14 1758  GLUCAP 449* 360* 300*   Imaging results:  Dg Chest 2 View  05/14/2014   CLINICAL DATA:  Initial encounter for three-week history of mid chest pain with abdominal pain.  EXAM: CHEST  2 VIEW  COMPARISON:  Chest x-ray from 11/29/2006.  CT chest from 11/29/2006.  FINDINGS: Lungs are hyperexpanded without edema or focal airspace consolidation. No pleural effusion. 10 mm nodule overlies the lateral left lung base in review of the previous CT reveals no calcified granuloma present at that time. The cardiopericardial silhouette is within normal limits for size. Imaged bony structures of the thorax are intact.  IMPRESSION: No acute cardiopulmonary process.  10 mm nodular density overlying the left base on the frontal film is probably a nipple  shadow given the appearance of the nipple on the lateral projection. Repeat frontal radiograph with nipple markers could confirm.   Electronically Signed   By: Misty Stanley M.D.   On: 05/14/2014 13:43    Other results: EKG: normal EKG, normal sinus rhythm, unchanged from previous tracings, sinus tachycardia.  Assessment & Plan by Problem: Active Problems:   DKA (diabetic ketoacidoses)   Diabetic ketoacidosis  DKA: Patient presents with a 2 weeks history of weakness, fatigue, muscle aches, loss of appetite, nausea and vomiting. On admission, patient was afebrile, normotensive, tachycardia at 117 and satting 97% on RA. Patient was without leukocytosis. Bicarb was low at 6, BUN/Cr elevated at 37/1.57 and glucose elevated at 646. Patient has an anion gapped metabolic acidosis. Patient had an anion gap of 42. Venous blood gas showed pH of 7.188, pCO2 23.3, pO2 53, bicarb 8.9, acid base excess of 17 and O2 sat of 80. Urinalysis showed no signs of infection (negative nitrites and leukocytes, many squams, many bacteria), but did show >80 ketones and >1000 glucose. Patient has a history of diabetes and is on glipizide 2.5 mg daily at home. There is no record of a hemoglobin A1c in our system. She does not check her blood glucose at home and has not seen her PCP in a year. She denies being admitting for hyperglycemia in the past. The cause of her DKA uncertain as this time. She may have newly uncontrolled diabetes or there may have been a precipitating factor such as infection. However, CXR shows no acute cardiopulmonary process.  -Insulin gtt -Zofran 4 mg Q6H prn -NS 1 L bolus x 2 -NS 125 mL/hr for 12 hours -Transition to D5-1/2 NS at 125 mL/hr once CBG <250  -BMET Q2H -Cardiac monitor -NPO -Hemoglobin A1C -Magnesium/Phosphorus -No K since K>5.0 -Troponin I x 3  HTN: BP 123/71 on admission. Patient is on amlodipine 10 mg daily and furosemide 40 mg daily at home. -Hold po meds  H/o Renal  Transplant: Patient had a kidney transplant and is on Cellcept 500 mg BID, Tacrolimus 4.5 mg BID, prednisone 5 mg daily at home. -Hold po  meds  Hypothyroidism: Patient is on levothyroxine 100 mcg daily at home. No recent TSH since 2008. -TSH -Hold po meds  H/o DVT: Patient had IVC filter placed. On ASA 81 mg daily at home.  DVT/PE ppx: SCDs  Dispo: Disposition is deferred at this time, awaiting improvement of current medical problems. Anticipated discharge in approximately 1-2 day(s).   The patient does not have a current PCP (No primary care provider on file.) and does need an [facility] hospital follow-up appointment after discharge.  The patient does have transportation limitations that hinder transportation to clinic appointments.  Signed: Osa Craver, DO PGY-1 Internal Medicine Resident Pager # [phone] 05/14/2014 6:57 PM

## 2014-05-14 NOTE — ED Notes (Addendum)
Critical Labs of Co2<7 and Glucose [address] communication to Dr. Maryan Rued and [place].

## 2014-05-14 NOTE — Significant Event (Signed)
CRITICAL VALUE ALERT  Critical value received:  CO2 = 8  Time of notification:  2244  Critical value read back: yes  Nurse who received alert:  Debby Freiberg, RN  MD notified (1st page):  Dr. Sherrine Maples  Time of first page:  2245  MD notified (2nd page):  Time of second page:  Responding MD:  Dr. Sherrine Maples  Time MD responded:  2245

## 2014-05-14 NOTE — ED Notes (Addendum)
Per pt sts chest pain x 3 weeks. sts vomiting x 2 weeks. sts acid reflux and cramping all over. Pt actively vomiting at triage and dark color. sts also she is very thirsty and gets out of breath.

## 2014-05-14 NOTE — ED Notes (Signed)
Dr. Plunkett at bedside.  

## 2014-05-15 DIAGNOSIS — N186 End stage renal disease: Secondary | ICD-10-CM

## 2014-05-15 DIAGNOSIS — Z86718 Personal history of other venous thrombosis and embolism: Secondary | ICD-10-CM

## 2014-05-15 DIAGNOSIS — E1010 Type 1 diabetes mellitus with ketoacidosis without coma: Secondary | ICD-10-CM

## 2014-05-15 DIAGNOSIS — E039 Hypothyroidism, unspecified: Secondary | ICD-10-CM

## 2014-05-15 DIAGNOSIS — I10 Essential (primary) hypertension: Secondary | ICD-10-CM

## 2014-05-15 DIAGNOSIS — Z940 Kidney transplant status: Secondary | ICD-10-CM

## 2014-05-15 LAB — TROPONIN I
Troponin I: <0.3 ng/mL (ref <0.30)
Troponin I: <0.3 ng/mL (ref <0.30)
Troponin I: <0.3 ng/mL (ref <0.30)
Troponin I: <0.3 ng/mL (ref <0.30)

## 2014-05-15 LAB — BASIC METABOLIC PANEL
ANION GAP: 22 — AB (ref 5–15)
ANION GAP: 23 — AB (ref 5–15)
ANION GAP: 18 — AB (ref 5–15)
ANION GAP: 18 — AB (ref 5–15)
ANION GAP: 20 — AB (ref 5–15)
Anion gap: 22 — ABNORMAL HIGH (ref 5–15)
Anion gap: 19 — ABNORMAL HIGH (ref 5–15)
Anion gap: 19 — ABNORMAL HIGH (ref 5–15)
Anion gap: 18 — ABNORMAL HIGH (ref 5–15)
BUN: 24 mg/dL — ABNORMAL HIGH (ref 6–23)
BUN: 21 mg/dL (ref 6–23)
BUN: 20 mg/dL (ref 6–23)
BUN: 19 mg/dL (ref 6–23)
BUN: 18 mg/dL (ref 6–23)
BUN: 18 mg/dL (ref 6–23)
BUN: 15 mg/dL (ref 6–23)
BUN: 13 mg/dL (ref 6–23)
BUN: 13 mg/dL (ref 6–23)
CALCIUM: 9.6 mg/dL (ref 8.4–10.5)
CALCIUM: 9.2 mg/dL (ref 8.4–10.5)
CHLORIDE: 113 meq/L — AB (ref 96–112)
CHLORIDE: 112 meq/L (ref 96–112)
CO2: 14 mEq/L — ABNORMAL LOW (ref 19–32)
CO2: 14 mEq/L — ABNORMAL LOW (ref 19–32)
CO2: 16 mEq/L — ABNORMAL LOW (ref 19–32)
CO2: 16 meq/L — AB (ref 19–32)
CO2: 16 mEq/L — ABNORMAL LOW (ref 19–32)
CO2: 15 mEq/L — ABNORMAL LOW (ref 19–32)
CO2: 16 mEq/L — ABNORMAL LOW (ref 19–32)
CO2: 16 mEq/L — ABNORMAL LOW (ref 19–32)
CO2: 14 mEq/L — ABNORMAL LOW (ref 19–32)
CREATININE: 1.06 mg/dL (ref 0.50–1.10)
CREATININE: 1.06 mg/dL (ref 0.50–1.10)
CREATININE: 1.02 mg/dL (ref 0.50–1.10)
CREATININE: 1.01 mg/dL (ref 0.50–1.10)
CREATININE: 0.97 mg/dL (ref 0.50–1.10)
CREATININE: 0.91 mg/dL (ref 0.50–1.10)
Calcium: 9.4 mg/dL (ref 8.4–10.5)
Calcium: 9.4 mg/dL (ref 8.4–10.5)
Calcium: 9.6 mg/dL (ref 8.4–10.5)
Calcium: 9.5 mg/dL (ref 8.4–10.5)
Calcium: 9.7 mg/dL (ref 8.4–10.5)
Calcium: 9.7 mg/dL (ref 8.4–10.5)
Calcium: 9.5 mg/dL (ref 8.4–10.5)
Chloride: 114 mEq/L — ABNORMAL HIGH (ref 96–112)
Chloride: 112 mEq/L (ref 96–112)
Chloride: 113 mEq/L — ABNORMAL HIGH (ref 96–112)
Chloride: 110 mEq/L (ref 96–112)
Chloride: 109 mEq/L (ref 96–112)
Chloride: 112 mEq/L (ref 96–112)
Chloride: 108 mEq/L (ref 96–112)
Creatinine, Ser: 1 mg/dL (ref 0.50–1.10)
Creatinine, Ser: 1.02 mg/dL (ref 0.50–1.10)
Creatinine, Ser: 0.92 mg/dL (ref 0.50–1.10)
GFR calc Af Amer: 66 mL/min — ABNORMAL LOW (ref >90)
GFR calc Af Amer: 74 mL/min — ABNORMAL LOW (ref >90)
GFR calc Af Amer: 75 mL/min — ABNORMAL LOW (ref >90)
GFR calc non Af Amer: 53 mL/min — ABNORMAL LOW (ref >90)
GFR calc non Af Amer: 57 mL/min — ABNORMAL LOW (ref >90)
GFR calc non Af Amer: 57 mL/min — ABNORMAL LOW (ref >90)
GFR calc non Af Amer: 56 mL/min — ABNORMAL LOW (ref >90)
GFR calc non Af Amer: 64 mL/min — ABNORMAL LOW (ref >90)
GFR, EST AFRICAN AMERICAN: 62 mL/min — AB (ref >90)
GFR, EST AFRICAN AMERICAN: 62 mL/min — AB (ref >90)
GFR, EST AFRICAN AMERICAN: 65 mL/min — AB (ref >90)
GFR, EST AFRICAN AMERICAN: 67 mL/min — AB (ref >90)
GFR, EST AFRICAN AMERICAN: 65 mL/min — AB (ref >90)
GFR, EST AFRICAN AMERICAN: 69 mL/min — AB (ref >90)
GFR, EST NON AFRICAN AMERICAN: 53 mL/min — AB (ref >90)
GFR, EST NON AFRICAN AMERICAN: 56 mL/min — AB (ref >90)
GFR, EST NON AFRICAN AMERICAN: 60 mL/min — AB (ref >90)
GFR, EST NON AFRICAN AMERICAN: 63 mL/min — AB (ref >90)
GLUCOSE: 110 mg/dL — AB (ref 70–99)
GLUCOSE: 208 mg/dL — AB (ref 70–99)
GLUCOSE: 186 mg/dL — AB (ref 70–99)
Glucose, Bld: 125 mg/dL — ABNORMAL HIGH (ref 70–99)
Glucose, Bld: 178 mg/dL — ABNORMAL HIGH (ref 70–99)
Glucose, Bld: 172 mg/dL — ABNORMAL HIGH (ref 70–99)
Glucose, Bld: 179 mg/dL — ABNORMAL HIGH (ref 70–99)
Glucose, Bld: 168 mg/dL — ABNORMAL HIGH (ref 70–99)
Glucose, Bld: 108 mg/dL — ABNORMAL HIGH (ref 70–99)
POTASSIUM: 4.4 meq/L (ref 3.7–5.3)
POTASSIUM: 4.3 meq/L (ref 3.7–5.3)
POTASSIUM: 4.3 meq/L (ref 3.7–5.3)
POTASSIUM: 4.4 meq/L (ref 3.7–5.3)
Potassium: 4.4 mEq/L (ref 3.7–5.3)
Potassium: 4.3 mEq/L (ref 3.7–5.3)
Potassium: 3.7 mEq/L (ref 3.7–5.3)
Potassium: 4.1 mEq/L (ref 3.7–5.3)
Potassium: 5 mEq/L (ref 3.7–5.3)
SODIUM: 147 meq/L (ref 137–147)
SODIUM: 148 meq/L — AB (ref 137–147)
SODIUM: 143 meq/L (ref 137–147)
SODIUM: 146 meq/L (ref 137–147)
Sodium: 149 mEq/L — ABNORMAL HIGH (ref 137–147)
Sodium: 150 mEq/L — ABNORMAL HIGH (ref 137–147)
Sodium: 147 mEq/L (ref 137–147)
Sodium: 147 mEq/L (ref 137–147)
Sodium: 142 mEq/L (ref 137–147)

## 2014-05-15 LAB — HEMOGLOBIN A1C
HEMOGLOBIN A1C: 15 % — AB (ref <5.7)
MEAN PLASMA GLUCOSE: 384 mg/dL — AB (ref <117)

## 2014-05-15 LAB — GLUCOSE, CAPILLARY
GLUCOSE-CAPILLARY: 116 mg/dL — AB (ref 70–99)
GLUCOSE-CAPILLARY: 135 mg/dL — AB (ref 70–99)
GLUCOSE-CAPILLARY: 137 mg/dL — AB (ref 70–99)
GLUCOSE-CAPILLARY: 144 mg/dL — AB (ref 70–99)
GLUCOSE-CAPILLARY: 159 mg/dL — AB (ref 70–99)
GLUCOSE-CAPILLARY: 175 mg/dL — AB (ref 70–99)
GLUCOSE-CAPILLARY: 204 mg/dL — AB (ref 70–99)
Glucose-Capillary: 163 mg/dL — ABNORMAL HIGH (ref 70–99)
Glucose-Capillary: 126 mg/dL — ABNORMAL HIGH (ref 70–99)
Glucose-Capillary: 165 mg/dL — ABNORMAL HIGH (ref 70–99)
Glucose-Capillary: 176 mg/dL — ABNORMAL HIGH (ref 70–99)
Glucose-Capillary: 172 mg/dL — ABNORMAL HIGH (ref 70–99)
Glucose-Capillary: 158 mg/dL — ABNORMAL HIGH (ref 70–99)
Glucose-Capillary: 203 mg/dL — ABNORMAL HIGH (ref 70–99)
Glucose-Capillary: 178 mg/dL — ABNORMAL HIGH (ref 70–99)
Glucose-Capillary: 236 mg/dL — ABNORMAL HIGH (ref 70–99)
Glucose-Capillary: 173 mg/dL — ABNORMAL HIGH (ref 70–99)
Glucose-Capillary: 119 mg/dL — ABNORMAL HIGH (ref 70–99)
Glucose-Capillary: 146 mg/dL — ABNORMAL HIGH (ref 70–99)
Glucose-Capillary: 208 mg/dL — ABNORMAL HIGH (ref 70–99)
Glucose-Capillary: 200 mg/dL — ABNORMAL HIGH (ref 70–99)

## 2014-05-15 LAB — URINE MICROSCOPIC-ADD ON

## 2014-05-15 LAB — URINALYSIS, ROUTINE W REFLEX MICROSCOPIC
BILIRUBIN URINE: NEGATIVE
Glucose, UA: >1000 mg/dL — AB
Ketones, ur: 40 mg/dL — AB
Leukocytes, UA: NEGATIVE
Nitrite: NEGATIVE
PH: 6 (ref 5.0–8.0)
Protein, ur: 100 mg/dL — AB
Specific Gravity, Urine: 1.02 (ref 1.005–1.030)
Urobilinogen, UA: 0.2 mg/dL (ref 0.0–1.0)

## 2014-05-15 LAB — T4, FREE: Free T4: 1.52 ng/dL (ref 0.80–1.80)

## 2014-05-15 LAB — C-PEPTIDE: C-Peptide: 0.08 ng/mL — ABNORMAL LOW (ref 0.80–3.90)

## 2014-05-15 MED ORDER — WHITE PETROLATUM GEL
Status: AC
  Administered 2014-05-15: 1
  Filled 2014-05-15: qty 5

## 2014-05-15 MED ORDER — POTASSIUM CHLORIDE 10 MEQ/100ML IV SOLN
10.0000 meq | INTRAVENOUS | Status: AC
  Administered 2014-05-15 (×2): 10 meq via INTRAVENOUS
  Filled 2014-05-15 (×2): qty 100

## 2014-05-15 MED ORDER — LEVOTHYROXINE SODIUM 88 MCG PO TABS
88.0000 ug | ORAL_TABLET | Freq: Every day | ORAL | Status: DC
  Filled 2014-05-15: qty 1

## 2014-05-15 MED ORDER — CETYLPYRIDINIUM CHLORIDE 0.05 % MT LIQD
7.0000 mL | Freq: Two times a day (BID) | OROMUCOSAL | Status: DC
  Administered 2014-05-15 – 2014-05-19 (×7): 7 mL via OROMUCOSAL

## 2014-05-15 MED ORDER — AMLODIPINE BESYLATE 10 MG PO TABS
10.0000 mg | ORAL_TABLET | Freq: Every day | ORAL | Status: DC
  Administered 2014-05-15 – 2014-05-19 (×5): 10 mg via ORAL
  Filled 2014-05-15 (×5): qty 1
  Filled 2014-05-15: qty 2
  Filled 2014-05-15: qty 1

## 2014-05-15 MED ORDER — POTASSIUM CHLORIDE 10 MEQ/100ML IV SOLN
10.0000 meq | Freq: Once | INTRAVENOUS | Status: AC
  Administered 2014-05-15: 10 meq via INTRAVENOUS
  Filled 2014-05-15: qty 100

## 2014-05-15 MED ORDER — PREDNISONE 5 MG PO TABS
5.0000 mg | ORAL_TABLET | Freq: Every day | ORAL | Status: DC
  Administered 2014-05-15 – 2014-05-20 (×6): 5 mg via ORAL
  Filled 2014-05-15 (×6): qty 1

## 2014-05-15 MED ORDER — TACROLIMUS 1 MG PO CAPS
4.5000 mg | ORAL_CAPSULE | Freq: Two times a day (BID) | ORAL | Status: DC
  Administered 2014-05-15 – 2014-05-20 (×11): 4.5 mg via ORAL
  Filled 2014-05-15 (×13): qty 1

## 2014-05-15 MED ORDER — MYCOPHENOLATE MOFETIL 250 MG PO CAPS
500.0000 mg | ORAL_CAPSULE | Freq: Two times a day (BID) | ORAL | Status: DC
  Administered 2014-05-15 – 2014-05-20 (×11): 500 mg via ORAL
  Filled 2014-05-15 (×13): qty 2

## 2014-05-15 NOTE — Progress Notes (Signed)
Pt seen and examined with Dr. Marvel Plan. Please refer to resident note for details. Plan was discussed with residents in detail  In brief, 68 y/o female with of DM, HTN , ESRD s/p renal transplant in 2010, DVT s/p IVC (not on a/c secondary to GI bleed) p/w n/v, lightheadedness * 2 weeks. Pt also complained of polyuria, polydipsia and polyuria. Pt unable to take meds secondary to nausea. In ED found to be in DKA and admitted to SDU for further management  Physical exam: Gen: AAO*3, NAD CVS: tachycardic, normal heart sounds Pulm: CTA b/l Abd: soft, NT, BS + Ext: no edema  Assessment and Plan: 68 y/o female p/w DKA  DKA: - c/w insulin gtt - c/w IVF- D5 1/2 NS@ 125 cc/hr - Can consider adding KCl to IVF if K decreases - NPO except meds -  Uncertain as to why pt's A1C and BS uncontrolled now as she has been maintaining her A1C in the 7s till August of this year. Will check anti GAD antibody and C-peptide levels - Monitor BMET q 2 hrs till AG closes  ESRD s/p renal transplant: - c/w cellcept, tacrolimus - resume prednisone once off insulin gtt   Hypothyroidism: - Pt with low TSH. Would hold synthroid - check free T4

## 2014-05-15 NOTE — Progress Notes (Signed)
Subjective:  Patient was seen and examined this morning. Patient states she feels a little better, but she wasn't able to sleep well last night. She continues to feel fatigued and nauseous, but denies vomiting. She continues to feel thirsty, but improved from prior. Patient denies diarrhea.  Objective: Vital signs in last 24 hours: Filed Vitals:   05/14/14 1941 05/14/14 2200 05/15/14 0000 05/15/14 0350  BP: 136/83 150/90 163/92 157/88  Pulse: 110 112 115 114  Temp: 97.6 F (36.4 C)  98.2 F (36.8 C) 98.9 F (37.2 C)  TempSrc: Oral  Oral Oral  Resp: 17 15 18 19   Height:      Weight:    96.4 kg (212 lb 8.4 oz)  SpO2: 100% 99% 99% 98%   Weight change:   Intake/Output Summary (Last 24 hours) at 05/15/14 0750 Last data filed at 05/15/14 0700  Gross per 24 hour  Intake 3792.92 ml  Output   2500 ml  Net 1292.92 ml   General: Vital signs reviewed. Patient is well-developed and well-nourished, in mild distress and cooperative with exam.  HEENT: Dry mucous membranes. Missing teeth. Ketone breath. Cardiovascular: Tachycardic, regular rate, S1 normal, S2 normal, no murmurs, gallops, or rubs. Pulmonary/Chest: Clear to auscultation bilaterally, no wheezes, rales, or rhonchi. Abdominal: Soft, non-tender, obese, non-distended, BS +, no masses, organomegaly, or guarding present.  Musculoskeletal: No joint deformities, erythema, or stiffness, ROM full and nontender. Extremities: No lower extremity edema bilaterally, pulses symmetric and intact bilaterally. No cyanosis or clubbing. Neurological: A&O x3 Skin: Increased skin turgor.  Psychiatric: Normal mood and affect. speech and behavior is normal. Cognition and memory are normal.   Lab Results: Basic Metabolic Panel:  Recent Labs Lab 05/14/14 2040  05/15/14 0106 05/15/14 0455  NA 148*  < > 149* 150*  K 4.3  < > 4.4 4.3  CL 109  < > 113* 114*  CO2 8*  < > 14* 14*  GLUCOSE 185*  < > 125* 178*  BUN 29*  < > 24* 21  CREATININE  1.17*  < > 1.06 1.06  CALCIUM 9.6  < > 9.4 9.4  MG 2.5  --   --   --   PHOS 1.7*  --   --   --   < > = values in this interval not displayed. CBC:  Recent Labs Lab 05/14/14 1230  WBC 7.7  HGB 14.6  HCT 44.0  MCV 88.0  PLT 283   Cardiac Enzymes:  Recent Labs Lab 05/14/14 2040 05/15/14 0106  TROPONINI <0.30 <0.30   BNP:  Recent Labs Lab 05/14/14 1230  PROBNP 258.6*   CBG:  Recent Labs Lab 05/15/14 0107 05/15/14 0203 05/15/14 0301 05/15/14 0454 05/15/14 0602 05/15/14 0654  GLUCAP 126* 135* 175* 176* 172* 158*   Hemoglobin A1C:  Recent Labs Lab 05/14/14 2040  HGBA1C 15.0*   Thyroid Function Tests:  Recent Labs Lab 05/14/14 2040  TSH 0.060*   Urinalysis:  Recent Labs Lab 05/14/14 1703  COLORURINE YELLOW  LABSPEC 1.025  PHURINE 5.0  GLUCOSEU >1000*  HGBUR TRACE*  BILIRUBINUR NEGATIVE  KETONESUR >80*  PROTEINUR NEGATIVE  UROBILINOGEN 0.2  NITRITE NEGATIVE  LEUKOCYTESUR NEGATIVE    Micro Results: Recent Results (from the past 240 hour(s))  MRSA PCR Screening     Status: None   Collection Time: 05/14/14  6:04 PM  Result Value Ref Range Status   MRSA by PCR NEGATIVE NEGATIVE Final    Comment:  The GeneXpert MRSA Assay (FDA approved for NASAL specimens only), is one component of a comprehensive MRSA colonization surveillance program. It is not intended to diagnose MRSA infection nor to guide or monitor treatment for MRSA infections.    Studies/Results: Dg Chest 2 View  05/14/2014   CLINICAL DATA:  Initial encounter for three-week history of mid chest pain with abdominal pain.  EXAM: CHEST  2 VIEW  COMPARISON:  Chest x-ray from 11/29/2006.  CT chest from 11/29/2006.  FINDINGS: Lungs are hyperexpanded without edema or focal airspace consolidation. No pleural effusion. 10 mm nodule overlies the lateral left lung base in review of the previous CT reveals no calcified granuloma present at that time. The cardiopericardial silhouette  is within normal limits for size. Imaged bony structures of the thorax are intact.  IMPRESSION: No acute cardiopulmonary process.  10 mm nodular density overlying the left base on the frontal film is probably a nipple shadow given the appearance of the nipple on the lateral projection. Repeat frontal radiograph with nipple markers could confirm.   Electronically Signed   By: Misty Stanley M.D.   On: 05/14/2014 13:43   Medications:  I have reviewed the patient's current medications. Prior to Admission:  Prescriptions prior to admission  Medication Sig Dispense Refill Last Dose  . amLODipine (NORVASC) 10 MG tablet Take 10 mg by mouth at bedtime.    05/13/2014 at Unknown time  . aspirin EC 81 MG tablet Take 81 mg by mouth every evening.   05/13/2014 at Unknown time  . furosemide (LASIX) 40 MG tablet Take 40 mg by mouth daily.   05/13/2014 at Unknown time  . glipiZIDE (GLUCOTROL XL) 2.5 MG 24 hr tablet Take 2.5 mg by mouth daily.   05/13/2014 at Unknown time  . levothyroxine (SYNTHROID, LEVOTHROID) 100 MCG tablet Take 100 mcg by mouth daily.   05/13/2014 at Unknown time  . Magnesium 500 MG TABS Take 500 mg by mouth daily.   05/13/2014 at Unknown time  . Multiple Vitamin (MULTIVITAMIN WITH MINERALS) TABS tablet Take 1 tablet by mouth daily.   05/13/2014 at Unknown time  . mycophenolate (CELLCEPT) 250 MG capsule Take 500 mg by mouth 2 (two) times daily.   05/13/2014 at Unknown time  . pantoprazole (PROTONIX) 40 MG tablet Take 40 mg by mouth daily.   05/13/2014 at Unknown time  . potassium chloride (KLOR-CON) 8 MEQ tablet Take 8 mEq by mouth daily.   05/13/2014 at Unknown time  . pravastatin (PRAVACHOL) 40 MG tablet Take 40 mg by mouth every evening.   05/13/2014 at Unknown time  . predniSONE (DELTASONE) 5 MG tablet Take 5 mg by mouth daily.   05/13/2014 at Unknown time  . sodium bicarbonate 650 MG tablet Take 650 mg by mouth as needed for heartburn.   05/13/2014 at Unknown time  .  sulfamethoxazole-trimethoprim (BACTRIM,SEPTRA) 400-80 MG per tablet Take 1 tablet by mouth every Monday, Wednesday, and Friday.   Past Week at Unknown time  . tacrolimus (PROGRAF) 0.5 MG capsule Take 0.5 mg by mouth 2 (two) times daily. Takes total of 4.5mg  twice daily   05/13/2014 at Unknown time  . tacrolimus (PROGRAF) 1 MG capsule Take 4 mg by mouth 2 (two) times daily. Takes 4.5mg  total twice daily   05/13/2014 at Unknown time   Scheduled Meds: . mycophenolate  500 mg Oral BID  . predniSONE  5 mg Oral Daily  . tacrolimus  4.5 mg Oral BID   Continuous Infusions: . sodium chloride 1,000  mL (05/14/14 1501)  . dextrose 5 % and 0.45% NaCl 50 mL/hr at 05/15/14 0706  . insulin (NOVOLIN-R) infusion 2 Units/hr (05/15/14 0656)   PRN Meds:.ondansetron (ZOFRAN) IV, promethazine Assessment/Plan: Active Problems:   DKA (diabetic ketoacidoses)   Diabetic ketoacidosis  DKA: Patient has a history of diabetes and is on glipizide 2.5 mg daily at home. Records show previous hemoglobin A1c of about 8 in August of 2015. On recheck, hemoglobin A1c is 15. We are unclear of the etiology of such a rapid deterioration in her diabetes control within 3-4 months. Glucose has improved from 624>179. AG 42>19. Bicarb 6>16. Troponins negative. Magnesium normal at 2.5. Phosphorus low at 1.7 -Insulin gtt -Zofran 4 mg Q6H prn -D5-1/2 NS at 125 mL/hr  -BMET Q2H until gap closes and off gtt -Cardiac monitor -NPO -Once patients AG closes x 2, start Lantus 20 units and SSI-S and keep gtt on for 1-2 more hours -Anti-GAD -C-peptide -KCl 10 mEq x 2  HTN: BP 150-160s/80s. Patient is on amlodipine 10 mg daily and furosemide 40 mg daily at home. -Amlodipine 10 mg daily  H/o Renal Transplant: Patient had a kidney transplant and is on Cellcept 500 mg BID, Tacrolimus 4.5 mg BID, prednisone 5 mg daily at home. -Cellcept 500 mg BID -Tacrolimus 4.5 mg BID  Hypothyroidism: Patient is on levothyroxine 100 mcg daily at home.  TSH was 0.06 on admission. Patient is likely taking too much synthroid at home. Patient needs to be decreased to Synthroid 88 mcg daily. 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 On ASA 81 mg daily at home.  DVT/PE ppx: SCDs  Dispo: Disposition is deferred at this time, awaiting improvement of current medical problems.  Anticipated discharge in approximately 1-2 day(s).   The patient does have a current PCP (No primary care provider on file.) and does need an [facility] hospital follow-up appointment after discharge.  The patient does have transportation limitations that hinder transportation to clinic appointments.  .Services Needed at time of discharge: Y = Yes, Blank = No PT:   OT:   RN:   Equipment:   Other:     LOS: 1 day   Osa Craver, DO PGY-1 Internal Medicine Resident Pager # [phone] 05/15/2014 7:50 AM

## 2014-05-16 LAB — BASIC METABOLIC PANEL
ANION GAP: 14 (ref 5–15)
ANION GAP: 16 — AB (ref 5–15)
ANION GAP: 17 — AB (ref 5–15)
Anion gap: 16 — ABNORMAL HIGH (ref 5–15)
Anion gap: 20 — ABNORMAL HIGH (ref 5–15)
Anion gap: 13 (ref 5–15)
BUN: 12 mg/dL (ref 6–23)
BUN: 10 mg/dL (ref 6–23)
BUN: 9 mg/dL (ref 6–23)
BUN: 8 mg/dL (ref 6–23)
BUN: 7 mg/dL (ref 6–23)
BUN: 6 mg/dL (ref 6–23)
CALCIUM: 9.5 mg/dL (ref 8.4–10.5)
CALCIUM: 9.2 mg/dL (ref 8.4–10.5)
CALCIUM: 9.1 mg/dL (ref 8.4–10.5)
CALCIUM: 9.1 mg/dL (ref 8.4–10.5)
CALCIUM: 9.1 mg/dL (ref 8.4–10.5)
CHLORIDE: 106 meq/L (ref 96–112)
CHLORIDE: 105 meq/L (ref 96–112)
CHLORIDE: 104 meq/L (ref 96–112)
CO2: 16 mEq/L — ABNORMAL LOW (ref 19–32)
CO2: 15 meq/L — AB (ref 19–32)
CO2: 18 mEq/L — ABNORMAL LOW (ref 19–32)
CO2: 18 mEq/L — ABNORMAL LOW (ref 19–32)
CO2: 15 mEq/L — ABNORMAL LOW (ref 19–32)
CO2: 20 mEq/L (ref 19–32)
CREATININE: 0.87 mg/dL (ref 0.50–1.10)
CREATININE: 0.93 mg/dL (ref 0.50–1.10)
CREATININE: 0.87 mg/dL (ref 0.50–1.10)
CREATININE: 0.86 mg/dL (ref 0.50–1.10)
CREATININE: 0.8 mg/dL (ref 0.50–1.10)
Calcium: 9.3 mg/dL (ref 8.4–10.5)
Chloride: 111 mEq/L (ref 96–112)
Chloride: 107 mEq/L (ref 96–112)
Chloride: 109 mEq/L (ref 96–112)
Creatinine, Ser: 0.86 mg/dL (ref 0.50–1.10)
GFR calc Af Amer: 79 mL/min — ABNORMAL LOW (ref >90)
GFR calc Af Amer: 73 mL/min — ABNORMAL LOW (ref >90)
GFR calc Af Amer: 79 mL/min — ABNORMAL LOW (ref >90)
GFR calc non Af Amer: 68 mL/min — ABNORMAL LOW (ref >90)
GFR calc non Af Amer: 63 mL/min — ABNORMAL LOW (ref >90)
GFR calc non Af Amer: 68 mL/min — ABNORMAL LOW (ref >90)
GFR calc non Af Amer: 69 mL/min — ABNORMAL LOW (ref >90)
GFR calc non Af Amer: 75 mL/min — ABNORMAL LOW (ref >90)
GFR, EST AFRICAN AMERICAN: 80 mL/min — AB (ref >90)
GFR, EST AFRICAN AMERICAN: 80 mL/min — AB (ref >90)
GFR, EST AFRICAN AMERICAN: 87 mL/min — AB (ref >90)
GFR, EST NON AFRICAN AMERICAN: 69 mL/min — AB (ref >90)
GLUCOSE: 143 mg/dL — AB (ref 70–99)
Glucose, Bld: 186 mg/dL — ABNORMAL HIGH (ref 70–99)
Glucose, Bld: 195 mg/dL — ABNORMAL HIGH (ref 70–99)
Glucose, Bld: 116 mg/dL — ABNORMAL HIGH (ref 70–99)
Glucose, Bld: 169 mg/dL — ABNORMAL HIGH (ref 70–99)
Glucose, Bld: 150 mg/dL — ABNORMAL HIGH (ref 70–99)
Potassium: 3.4 mEq/L — ABNORMAL LOW (ref 3.7–5.3)
Potassium: 3.8 mEq/L (ref 3.7–5.3)
Potassium: 3.8 mEq/L (ref 3.7–5.3)
Potassium: 3.3 mEq/L — ABNORMAL LOW (ref 3.7–5.3)
Potassium: 4.3 mEq/L (ref 3.7–5.3)
Potassium: 3.6 mEq/L — ABNORMAL LOW (ref 3.7–5.3)
Sodium: 143 mEq/L (ref 137–147)
Sodium: 142 mEq/L (ref 137–147)
Sodium: 141 mEq/L (ref 137–147)
Sodium: 140 mEq/L (ref 137–147)
Sodium: 137 mEq/L (ref 137–147)
Sodium: 137 mEq/L (ref 137–147)

## 2014-05-16 LAB — CBC
HEMATOCRIT: 39.7 % (ref 36.0–46.0)
Hemoglobin: 13.5 g/dL (ref 12.0–15.0)
MCH: 29.2 pg (ref 26.0–34.0)
MCHC: 34 g/dL (ref 30.0–36.0)
MCV: 85.7 fL (ref 78.0–100.0)
Platelets: 192 10*3/uL (ref 150–400)
RBC: 4.63 MIL/uL (ref 3.87–5.11)
RDW: 15.1 % (ref 11.5–15.5)
WBC: 8.6 10*3/uL (ref 4.0–10.5)

## 2014-05-16 LAB — GLUCOSE, CAPILLARY
GLUCOSE-CAPILLARY: 192 mg/dL — AB (ref 70–99)
GLUCOSE-CAPILLARY: 174 mg/dL — AB (ref 70–99)
GLUCOSE-CAPILLARY: 215 mg/dL — AB (ref 70–99)
GLUCOSE-CAPILLARY: 207 mg/dL — AB (ref 70–99)
GLUCOSE-CAPILLARY: 165 mg/dL — AB (ref 70–99)
GLUCOSE-CAPILLARY: 175 mg/dL — AB (ref 70–99)
GLUCOSE-CAPILLARY: 159 mg/dL — AB (ref 70–99)
GLUCOSE-CAPILLARY: 142 mg/dL — AB (ref 70–99)
Glucose-Capillary: 131 mg/dL — ABNORMAL HIGH (ref 70–99)
Glucose-Capillary: 133 mg/dL — ABNORMAL HIGH (ref 70–99)
Glucose-Capillary: 137 mg/dL — ABNORMAL HIGH (ref 70–99)
Glucose-Capillary: 149 mg/dL — ABNORMAL HIGH (ref 70–99)
Glucose-Capillary: 153 mg/dL — ABNORMAL HIGH (ref 70–99)
Glucose-Capillary: 154 mg/dL — ABNORMAL HIGH (ref 70–99)
Glucose-Capillary: 158 mg/dL — ABNORMAL HIGH (ref 70–99)
Glucose-Capillary: 161 mg/dL — ABNORMAL HIGH (ref 70–99)
Glucose-Capillary: 168 mg/dL — ABNORMAL HIGH (ref 70–99)
Glucose-Capillary: 170 mg/dL — ABNORMAL HIGH (ref 70–99)
Glucose-Capillary: 172 mg/dL — ABNORMAL HIGH (ref 70–99)
Glucose-Capillary: 175 mg/dL — ABNORMAL HIGH (ref 70–99)
Glucose-Capillary: 201 mg/dL — ABNORMAL HIGH (ref 70–99)
Glucose-Capillary: 225 mg/dL — ABNORMAL HIGH (ref 70–99)

## 2014-05-16 LAB — TROPONIN I: Troponin I: <0.3 ng/mL (ref <0.30)

## 2014-05-16 LAB — T4, FREE: Free T4: 1.33 ng/dL (ref 0.80–1.80)

## 2014-05-16 MED ORDER — POTASSIUM CHLORIDE CRYS ER 20 MEQ PO TBCR
40.0000 meq | EXTENDED_RELEASE_TABLET | Freq: Two times a day (BID) | ORAL | Status: DC
Start: 2014-05-16 — End: 2014-05-17
  Administered 2014-05-17: 40 meq via ORAL
  Filled 2014-05-16 (×3): qty 2

## 2014-05-16 MED ORDER — POTASSIUM CHLORIDE 10 MEQ/100ML IV SOLN
10.0000 meq | INTRAVENOUS | Status: AC
  Administered 2014-05-16 (×2): 10 meq via INTRAVENOUS
  Filled 2014-05-16: qty 100

## 2014-05-16 MED ORDER — METOPROLOL TARTRATE 12.5 MG HALF TABLET
12.5000 mg | ORAL_TABLET | Freq: Two times a day (BID) | ORAL | Status: DC
  Administered 2014-05-16 – 2014-05-20 (×9): 12.5 mg via ORAL
  Filled 2014-05-16 (×11): qty 1

## 2014-05-16 MED ORDER — POTASSIUM CHLORIDE 10 MEQ/100ML IV SOLN
10.0000 meq | INTRAVENOUS | Status: AC
  Administered 2014-05-16 (×4): 10 meq via INTRAVENOUS
  Filled 2014-05-16 (×4): qty 100

## 2014-05-16 MED ORDER — POTASSIUM CHLORIDE CRYS ER 20 MEQ PO TBCR
40.0000 meq | EXTENDED_RELEASE_TABLET | Freq: Once | ORAL | Status: AC
  Administered 2014-05-16: 40 meq via ORAL
  Filled 2014-05-16: qty 2

## 2014-05-16 MED ORDER — POTASSIUM CHLORIDE 10 MEQ/100ML IV SOLN
10.0000 meq | INTRAVENOUS | Status: DC
Start: 2014-05-16 — End: 2014-05-16

## 2014-05-16 NOTE — Progress Notes (Signed)
Subjective:  Patient reports that she slept on and off last night. She is still feeling quite nauseous, but has not actually vomited. She is experiencing some mild dysuria. She denies any chest pain, shortness of breath, headache, diarrhea, palpitations, or abdominal pain.  Objective:  Vital signs in last 24 hours: Filed Vitals:   05/15/14 2347 05/16/14 0230 05/16/14 0330 05/16/14 0400  BP: 177/96 114/54 129/79 144/79  Pulse: 115 113 113 110  Temp: 98.8 F (37.1 C)  98.2 F (36.8 C)   TempSrc: Oral  Oral   Resp: 14 13 23 21   Height:      Weight:   96.3 kg (212 lb 4.9 oz)   SpO2: 100% 98% 99% 98%   Weight change: 3.2 kg (7 lb 0.9 oz)  Intake/Output Summary (Last 24 hours) at 05/16/14 0719 Last data filed at 05/16/14 0700  Gross per 24 hour  Intake 3776.62 ml  Output   2150 ml  Net 1626.62 ml   BP 147/85 mmHg  Pulse 106  Temp(Src) 98.2 F (36.8 C) (Oral)  Resp 19  Ht 5\' 7"  (1.702 m)  Wt 96.3 kg (212 lb 4.9 oz)  BMI 33.24 kg/m2  SpO2 99%   Vital signs reviewed. Patient is hypertensive this AM. General appearance: alert, cooperative and mild distress Lungs: clear to auscultation bilaterally and no wheezes, rales, or rhonci Heart: Tachycardic regular rhythm. No murmurs/rubs/gallops. Normal S1/S2 Abdomen: soft, non-tender; bowel sounds normal; no masses,  no organomegaly Extremities: extremities normal, atraumatic, no cyanosis or edema Neurologic: Mental status: Alert, oriented, thought content appropriate   Lab Results: Basic Metabolic Panel:  Recent Labs Lab 05/14/14 2040  05/16/14 0115 05/16/14 0550  NA 148*  < > 143 142  K 4.3  < > 3.4* 3.8  CL 109  < > 111 107  CO2 8*  < > 16* 15*  GLUCOSE 185*  < > 143* 186*  BUN 29*  < > 12 10  CREATININE 1.17*  < > 0.87 0.93  CALCIUM 9.6  < > 9.5 9.2  MG 2.5  --   --   --   PHOS 1.7*  --   --   --   < > = values in this interval not displayed.  CBC:  Recent Labs Lab 05/14/14 1230  WBC 7.7  HGB 14.6  HCT  44.0  MCV 88.0  PLT 283   Cardiac Enzymes:  Recent Labs Lab 05/15/14 0717 05/15/14 1831 05/16/14 0115  TROPONINI <0.30 <0.30 <0.30   BNP:  Recent Labs Lab 05/14/14 1230  PROBNP 258.6*   CBG:  Recent Labs Lab 05/16/14 0122 05/16/14 0227 05/16/14 0330 05/16/14 0451 05/16/14 0550 05/16/14 0654  GLUCAP 161* 133* 149* 201* 175* 192*   Hemoglobin A1C:  Recent Labs Lab 05/14/14 2040  HGBA1C 15.0*   Thyroid Function Tests:  Recent Labs Lab 05/14/14 2040 05/14/14 2229  TSH 0.060*  --   FREET4  --  1.52   Urinalysis:  Recent Labs Lab 05/14/14 1703 05/15/14 2000  COLORURINE YELLOW YELLOW  LABSPEC 1.025 1.020  PHURINE 5.0 6.0  GLUCOSEU >1000* >1000*  HGBUR TRACE* TRACE*  BILIRUBINUR NEGATIVE NEGATIVE  KETONESUR >80* 40*  PROTEINUR NEGATIVE 100*  UROBILINOGEN 0.2 0.2  NITRITE NEGATIVE NEGATIVE  LEUKOCYTESUR NEGATIVE NEGATIVE    Micro Results: Recent Results (from the past 240 hour(s))  MRSA PCR Screening     Status: None   Collection Time: 05/14/14  6:04 PM  Result Value Ref Range Status   MRSA  by PCR NEGATIVE NEGATIVE Final    Comment:        The GeneXpert MRSA Assay (FDA approved for NASAL specimens only), is one component of a comprehensive MRSA colonization surveillance program. It is not intended to diagnose MRSA infection nor to guide or monitor treatment for MRSA infections.    Studies/Results: Dg Chest 2 View  05/14/2014   CLINICAL DATA:  Initial encounter for three-week history of mid chest pain with abdominal pain.  EXAM: CHEST  2 VIEW  COMPARISON:  Chest x-ray from 11/29/2006.  CT chest from 11/29/2006.  FINDINGS: Lungs are hyperexpanded without edema or focal airspace consolidation. No pleural effusion. 10 mm nodule overlies the lateral left lung base in review of the previous CT reveals no calcified granuloma present at that time. The cardiopericardial silhouette is within normal limits for size. Imaged bony structures of the  thorax are intact.  IMPRESSION: No acute cardiopulmonary process.  10 mm nodular density overlying the left base on the frontal film is probably a nipple shadow given the appearance of the nipple on the lateral projection. Repeat frontal radiograph with nipple markers could confirm.   Electronically Signed   By: Misty Stanley M.D.   On: 05/14/2014 13:43   Medications: I have reviewed the patient's current medications. Scheduled Meds: . amLODipine  10 mg Oral QHS  . antiseptic oral rinse  7 mL Mouth Rinse BID  . mycophenolate  500 mg Oral BID  . potassium chloride  10 mEq Intravenous Q1 Hr x 4  . predniSONE  5 mg Oral Daily  . tacrolimus  4.5 mg Oral BID   Continuous Infusions: . sodium chloride 1,000 mL (05/14/14 1501)  . dextrose 5 % and 0.45% NaCl 125 mL/hr at 05/16/14 0120  . insulin (NOVOLIN-R) infusion 4 Units/hr (05/16/14 0656)   PRN Meds:.ondansetron (ZOFRAN) IV, promethazine   Assessment/Plan: Active Problems:   DKA (diabetic ketoacidoses)   Diabetic ketoacidosis  68 y.o. Female with PMHx of DM II, HTN, kidney transplantation, and DVT s/p IVC filter placement who was admitted for DKA.  1.) DKA: Sugars are improving. Last measured was 192. AG is still elevated at 20. Etiology of DKA uncertain. She had been well-controlled with A1c in the 7s as of August this year. Most recent A1c was 15.   - Continue insulin drip   - Continue D5 1/2 NS at 125cc/hr until AG closes   - NPO except sips w/ meds   - BMET q 2hrs until AG closes   - Anti-GAD Ab pending.   - Explained to pt that she will likely require insulin therapy at home as C-peptide level was 0.08. Diabetes educator to see patient today.  2.) Hypokalemia: Potassium has risen slightly (3.6 -> 3.8) since infusion of potassium given.    - Continue potassium infusions 10 mEq at a time. Re-evaluate with serial BMETs.   - Stop infusions once above 5  3.) HTN: Patient having some mild range blood pressures (most recent 147/85).  Patient takes amlodipine and lasix at home.   - Continue amlodipine 10mg  QD   - Hold home lasix for now  4.) ESRD s/p renal transplantation: Patient takes cellcept, tacrolimus, and prednisone   - Continue cellcept, tacrolimus   - Once finished with insulin gtt, resume home prednisone  5.) Hypothyroidism: TSH yesterday was 0.060, last Free T4 was 1.52. Currently a free T4 level pending.   - Hold synthroid for now. Will reasess  6.) DVT/PE Ppx: SCDs, IVC filter in place  Dispo: Disposition is deferred at this time, awaiting improvement of current medical problems.  Anticipated discharge in approximately 1-2 day(s).   The patient does not have a current PCP (No primary care provider on file.) and does need an [facility] hospital follow-up appointment after discharge.  The patient does have transportation limitations that hinder transportation to clinic appointments.  .Services Needed at time of discharge: Y = Yes, Blank = No PT:   OT:   RN:   Equipment:   Other:     LOS: 2 days   Farrell Ours, Med Student 05/16/2014, 7:19 AM

## 2014-05-16 NOTE — Progress Notes (Signed)
Pt seen and examined. Please refer to resident note for details. Plan was discussed with residents in detail  Pt still nauseous today. No fevers  Physical exam: Gen: AAO*3, NAD CVS: tachycardic, normal heart sounds Pulm: CTA b/l Abd: soft, NT, BS + Ext: no edema  Assessment and Plan: 68 y/o female p/w DKA  DKA: - c/w insulin gtt. AG closing. Will transition to lantus once gap closes * 2 - c/w IVF- D5 1/2 NS@ 125 cc/hr - Can consider adding KCl to IVF if K decreases - NPO except meds - Pt with low C peptide levels - Possibly secondary to worsening pancreatic function. Will f/u anti GAD antibodies - Pt will need insulin on d/c - Monitor BMET q 4 hrs till AG closes  ESRD s/p renal transplant: - c/w cellcept, tacrolimus, prednisone  Hypothyroidism: - Pt with low TSH but normal free T4 - Would c/w home dose of synthroid and recheck TSH once acute illness resolves

## 2014-05-16 NOTE — Plan of Care (Signed)
Problem: Phase I Progression Outcomes Goal: CBGs steadily decreasing on IV insulin drip Outcome: Progressing Goal: Monitor hydration status Outcome: Progressing Goal: Acidosis resolving Outcome: Progressing Goal: NPO or per MD order Outcome: Progressing Goal: K+ level approaching normal with therapy Outcome: Progressing Goal: Nausea/vomiting controlled with antiemetics Outcome: Progressing Goal: Pain controlled with appropriate interventions Outcome: Progressing Goal: Voiding-avoid urinary catheter unless indicated Outcome: Completed/Met Date Met:  05/16/14

## 2014-05-16 NOTE — Progress Notes (Signed)
Subjective: Patient seen at bedside this AM, complaining of some nausea. Still w/ AG of 20, HCO3 of 15.   C-peptide shows very poor pancreatic function, 0.08. Patient slightly upset by the idea of requiring insulin at this time.   Objective: Vital signs in last 24 hours: Filed Vitals:   05/15/14 2347 05/16/14 0230 05/16/14 0330 05/16/14 0400  BP: 177/96 114/54 129/79 144/79  Pulse: 115 113 113 110  Temp: 98.8 F (37.1 C)  98.2 F (36.8 C)   TempSrc: Oral  Oral   Resp: 14 13 23 21   Height:      Weight:   212 lb 4.9 oz (96.3 kg)   SpO2: 100% 98% 99% 98%   Weight change: 7 lb 0.9 oz (3.2 kg)  Intake/Output Summary (Last 24 hours) at 05/16/14 0802 Last data filed at 05/16/14 0700  Gross per 24 hour  Intake 3776.62 ml  Output   2150 ml  Net 1626.62 ml   General: Vital signs reviewed. Patient is well-developed and well-nourished, in mild distress and cooperative with exam.  HEENT: Dry mucous membranes. Poor dentition. Cardiovascular: Tachycardic, regular rate, S1 normal, S2 normal, no murmurs, gallops, or rubs. Pulmonary/Chest: Clear to auscultation bilaterally, no wheezes, rales, or rhonchi. Abdominal: Soft, non-tender, obese, non-distended, BS +, no masses, organomegaly, or guarding present.  Musculoskeletal: No joint deformities, erythema, or stiffness, ROM full and nontender. Extremities: No lower extremity edema bilaterally, pulses symmetric and intact bilaterally. No cyanosis or clubbing. Neurological: A&O x3 Skin: No erythema, rashes, or wounds.  Psychiatric: Normal mood and affect. speech and behavior is normal. Cognition and memory are normal.   Lab Results: Basic Metabolic Panel:  Recent Labs Lab 05/14/14 2040  05/16/14 0115 05/16/14 0550  NA 148*  < > 143 142  K 4.3  < > 3.4* 3.8  CL 109  < > 111 107  CO2 8*  < > 16* 15*  GLUCOSE 185*  < > 143* 186*  BUN 29*  < > 12 10  CREATININE 1.17*  < > 0.87 0.93  CALCIUM 9.6  < > 9.5 9.2  MG 2.5  --   --   --    PHOS 1.7*  --   --   --   < > = values in this interval not displayed. CBC:  Recent Labs Lab 05/14/14 1230 05/16/14 0550  WBC 7.7 8.6  HGB 14.6 13.5  HCT 44.0 39.7  MCV 88.0 85.7  PLT 283 192   Cardiac Enzymes:  Recent Labs Lab 05/15/14 1831 05/16/14 0115 05/16/14 0600  TROPONINI <0.30 <0.30 <0.30   BNP:  Recent Labs Lab 05/14/14 1230  PROBNP 258.6*   CBG:  Recent Labs Lab 05/16/14 0122 05/16/14 0227 05/16/14 0330 05/16/14 0451 05/16/14 0550 05/16/14 0654  GLUCAP 161* 133* 149* 201* 175* 192*   Hemoglobin A1C:  Recent Labs Lab 05/14/14 2040  HGBA1C 15.0*   Thyroid Function Tests:  Recent Labs Lab 05/14/14 2040 05/14/14 2229  TSH 0.060*  --   FREET4  --  1.52   Urinalysis:  Recent Labs Lab 05/14/14 1703 05/15/14 2000  COLORURINE YELLOW YELLOW  LABSPEC 1.025 1.020  PHURINE 5.0 6.0  GLUCOSEU >1000* >1000*  HGBUR TRACE* TRACE*  BILIRUBINUR NEGATIVE NEGATIVE  KETONESUR >80* 40*  PROTEINUR NEGATIVE 100*  UROBILINOGEN 0.2 0.2  NITRITE NEGATIVE NEGATIVE  LEUKOCYTESUR NEGATIVE NEGATIVE    Micro Results: Recent Results (from the past 240 hour(s))  MRSA PCR Screening     Status: None   Collection  Time: 05/14/14  6:04 PM  Result Value Ref Range Status   MRSA by PCR NEGATIVE NEGATIVE Final    Comment:        The GeneXpert MRSA Assay (FDA approved for NASAL specimens only), is one component of a comprehensive MRSA colonization surveillance program. It is not intended to diagnose MRSA infection nor to guide or monitor treatment for MRSA infections.    Studies/Results: Dg Chest 2 View  05/14/2014   CLINICAL DATA:  Initial encounter for three-week history of mid chest pain with abdominal pain.  EXAM: CHEST  2 VIEW  COMPARISON:  Chest x-ray from 11/29/2006.  CT chest from 11/29/2006.  FINDINGS: Lungs are hyperexpanded without edema or focal airspace consolidation. No pleural effusion. 10 mm nodule overlies the lateral left lung base in  review of the previous CT reveals no calcified granuloma present at that time. The cardiopericardial silhouette is within normal limits for size. Imaged bony structures of the thorax are intact.  IMPRESSION: No acute cardiopulmonary process.  10 mm nodular density overlying the left base on the frontal film is probably a nipple shadow given the appearance of the nipple on the lateral projection. Repeat frontal radiograph with nipple markers could confirm.   Electronically Signed   By: Misty Stanley M.D.   On: 05/14/2014 13:43   Medications:  I have reviewed the patient's current medications. Prior to Admission:  Prescriptions prior to admission  Medication Sig Dispense Refill Last Dose  . amLODipine (NORVASC) 10 MG tablet Take 10 mg by mouth at bedtime.    05/13/2014 at Unknown time  . aspirin EC 81 MG tablet Take 81 mg by mouth every evening.   05/13/2014 at Unknown time  . furosemide (LASIX) 40 MG tablet Take 40 mg by mouth daily.   05/13/2014 at Unknown time  . glipiZIDE (GLUCOTROL XL) 2.5 MG 24 hr tablet Take 2.5 mg by mouth daily.   05/13/2014 at Unknown time  . Magnesium 500 MG TABS Take 500 mg by mouth daily.   05/13/2014 at Unknown time  . Multiple Vitamin (MULTIVITAMIN WITH MINERALS) TABS tablet Take 1 tablet by mouth daily.   05/13/2014 at Unknown time  . mycophenolate (CELLCEPT) 250 MG capsule Take 500 mg by mouth 2 (two) times daily.   05/13/2014 at Unknown time  . pantoprazole (PROTONIX) 40 MG tablet Take 40 mg by mouth daily.   05/13/2014 at Unknown time  . potassium chloride (KLOR-CON) 8 MEQ tablet Take 8 mEq by mouth daily.   05/13/2014 at Unknown time  . pravastatin (PRAVACHOL) 40 MG tablet Take 40 mg by mouth every evening.   05/13/2014 at Unknown time  . predniSONE (DELTASONE) 5 MG tablet Take 5 mg by mouth daily.   05/13/2014 at Unknown time  . sodium bicarbonate 650 MG tablet Take 650 mg by mouth as needed for heartburn.   05/13/2014 at Unknown time  .  sulfamethoxazole-trimethoprim (BACTRIM,SEPTRA) 400-80 MG per tablet Take 1 tablet by mouth every Monday, Wednesday, and Friday.   Past Week at Unknown time  . tacrolimus (PROGRAF) 0.5 MG capsule Take 0.5 mg by mouth 2 (two) times daily. Takes total of 4.5mg  twice daily   05/13/2014 at Unknown time  . tacrolimus (PROGRAF) 1 MG capsule Take 4 mg by mouth 2 (two) times daily. Takes 4.5mg  total twice daily   05/13/2014 at Unknown time  . [DISCONTINUED] levothyroxine (SYNTHROID, LEVOTHROID) 100 MCG tablet Take 100 mcg by mouth daily.   05/13/2014 at Unknown time   Scheduled Meds: .  amLODipine  10 mg Oral QHS  . antiseptic oral rinse  7 mL Mouth Rinse BID  . mycophenolate  500 mg Oral BID  . predniSONE  5 mg Oral Daily  . tacrolimus  4.5 mg Oral BID   Continuous Infusions: . sodium chloride 1,000 mL (05/14/14 1501)  . dextrose 5 % and 0.45% NaCl 125 mL/hr at 05/16/14 0120  . insulin (NOVOLIN-R) infusion 4 Units/hr (05/16/14 0656)   PRN Meds:.ondansetron (ZOFRAN) IV, promethazine Assessment/Plan: Active Problems:   DKA (diabetic ketoacidoses)   Diabetic ketoacidosis  DKA: DM type II in the setting of steroid use initially. HbA1c in 01/2014 7.5, now 15. C-peptide 0.08. Patient has developed extremely poor pancreatic function and insulin output, will now require insulin at home. Continues to be on insulin gtt. Most recent AG 20, HCO3 15. Slow improvement. -Continue Insulin gtt; once AG closes, will  Transition to Lantus 20 units  -Zofran 4 mg Q6H prn -D5-1/2 NS at 125 mL/hr  -BMET Q4H until gap closes and off gtt -NPO for now -Anti-GAD pending -Supplement K  HTN: Hypertensive this AM. 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 Cr 0.93 this AM.  -Continue Cellcept 500 mg bid + Tacrolimus 4.5 mg bid + Prednisone 5 mg qd  Hypothyroidism: Patient is on  levothyroxine 100 mcg daily at home. TSH 0.06 on admission. Low TSH most likely d/t sick euthyroid syndrome. Expect fT4 to be normal.  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 On ASA 81 mg daily at home.  DVT/PE ppx: SCDs  Dispo: Disposition is deferred at this time, awaiting improvement of current medical problems.  Anticipated discharge in approximately 1-2 day(s).   The patient does have a current PCP (No primary care provider on file.) and does need an [facility] hospital follow-up appointment after discharge.  The patient does have transportation limitations that hinder transportation to clinic appointments.  .Services Needed at time of discharge: Y = Yes, Blank = No PT:   OT:   RN:   Equipment:   Other:     LOS: 2 days    Luanne Bras, MD 05/16/2014 8:14 AM

## 2014-05-17 DIAGNOSIS — E1310 Other specified diabetes mellitus with ketoacidosis without coma: Principal | ICD-10-CM

## 2014-05-17 DIAGNOSIS — D696 Thrombocytopenia, unspecified: Secondary | ICD-10-CM

## 2014-05-17 DIAGNOSIS — E0810 Diabetes mellitus due to underlying condition with ketoacidosis without coma: Secondary | ICD-10-CM

## 2014-05-17 LAB — BASIC METABOLIC PANEL
ANION GAP: 13 (ref 5–15)
ANION GAP: 14 (ref 5–15)
ANION GAP: 15 (ref 5–15)
ANION GAP: 13 (ref 5–15)
Anion gap: 14 (ref 5–15)
Anion gap: 14 (ref 5–15)
Anion gap: 16 — ABNORMAL HIGH (ref 5–15)
Anion gap: 13 (ref 5–15)
BUN: 6 mg/dL (ref 6–23)
BUN: 6 mg/dL (ref 6–23)
BUN: 6 mg/dL (ref 6–23)
BUN: 6 mg/dL (ref 6–23)
BUN: 7 mg/dL (ref 6–23)
BUN: 7 mg/dL (ref 6–23)
BUN: 7 mg/dL (ref 6–23)
BUN: 7 mg/dL (ref 6–23)
CALCIUM: 6.7 mg/dL — AB (ref 8.4–10.5)
CALCIUM: 8.8 mg/dL (ref 8.4–10.5)
CALCIUM: 9.3 mg/dL (ref 8.4–10.5)
CALCIUM: 9 mg/dL (ref 8.4–10.5)
CALCIUM: 9.2 mg/dL (ref 8.4–10.5)
CHLORIDE: 102 meq/L (ref 96–112)
CHLORIDE: 101 meq/L (ref 96–112)
CHLORIDE: 101 meq/L (ref 96–112)
CO2: 14 mEq/L — ABNORMAL LOW (ref 19–32)
CO2: 17 mEq/L — ABNORMAL LOW (ref 19–32)
CO2: 18 mEq/L — ABNORMAL LOW (ref 19–32)
CO2: 17 mEq/L — ABNORMAL LOW (ref 19–32)
CO2: 17 mEq/L — ABNORMAL LOW (ref 19–32)
CO2: 18 meq/L — AB (ref 19–32)
CO2: 19 meq/L (ref 19–32)
CO2: 19 mEq/L (ref 19–32)
CREATININE: 0.8 mg/dL (ref 0.50–1.10)
CREATININE: 0.89 mg/dL (ref 0.50–1.10)
CREATININE: 0.87 mg/dL (ref 0.50–1.10)
CREATININE: 0.86 mg/dL (ref 0.50–1.10)
CREATININE: 0.94 mg/dL (ref 0.50–1.10)
Calcium: 8.9 mg/dL (ref 8.4–10.5)
Calcium: 8.9 mg/dL (ref 8.4–10.5)
Calcium: 9 mg/dL (ref 8.4–10.5)
Chloride: 96 mEq/L (ref 96–112)
Chloride: 102 mEq/L (ref 96–112)
Chloride: 105 mEq/L (ref 96–112)
Chloride: 103 mEq/L (ref 96–112)
Chloride: 103 mEq/L (ref 96–112)
Creatinine, Ser: 0.62 mg/dL (ref 0.50–1.10)
Creatinine, Ser: 0.79 mg/dL (ref 0.50–1.10)
Creatinine, Ser: 0.78 mg/dL (ref 0.50–1.10)
GFR calc Af Amer: 80 mL/min — ABNORMAL LOW (ref >90)
GFR calc Af Amer: 72 mL/min — ABNORMAL LOW (ref >90)
GFR calc non Af Amer: 75 mL/min — ABNORMAL LOW (ref >90)
GFR calc non Af Amer: 85 mL/min — ABNORMAL LOW (ref >90)
GFR calc non Af Amer: 66 mL/min — ABNORMAL LOW (ref >90)
GFR calc non Af Amer: 68 mL/min — ABNORMAL LOW (ref >90)
GFR calc non Af Amer: 69 mL/min — ABNORMAL LOW (ref >90)
GFR calc non Af Amer: 62 mL/min — ABNORMAL LOW (ref >90)
GFR, EST AFRICAN AMERICAN: 87 mL/min — AB (ref >90)
GFR, EST AFRICAN AMERICAN: 77 mL/min — AB (ref >90)
GFR, EST AFRICAN AMERICAN: 79 mL/min — AB (ref >90)
GFR, EST NON AFRICAN AMERICAN: 85 mL/min — AB (ref >90)
Glucose, Bld: 178 mg/dL — ABNORMAL HIGH (ref 70–99)
Glucose, Bld: 181 mg/dL — ABNORMAL HIGH (ref 70–99)
Glucose, Bld: 152 mg/dL — ABNORMAL HIGH (ref 70–99)
Glucose, Bld: 191 mg/dL — ABNORMAL HIGH (ref 70–99)
Glucose, Bld: 151 mg/dL — ABNORMAL HIGH (ref 70–99)
Glucose, Bld: 186 mg/dL — ABNORMAL HIGH (ref 70–99)
Glucose, Bld: 92 mg/dL (ref 70–99)
POTASSIUM: 4.1 meq/L (ref 3.7–5.3)
POTASSIUM: 4.5 meq/L (ref 3.7–5.3)
POTASSIUM: 4.3 meq/L (ref 3.7–5.3)
Potassium: 2.8 mEq/L — CL (ref 3.7–5.3)
Potassium: 4.2 mEq/L (ref 3.7–5.3)
Potassium: 5 mEq/L (ref 3.7–5.3)
Potassium: 3.9 mEq/L (ref 3.7–5.3)
Potassium: 3.8 mEq/L (ref 3.7–5.3)
SODIUM: 124 meq/L — AB (ref 137–147)
SODIUM: 133 meq/L — AB (ref 137–147)
SODIUM: 133 meq/L — AB (ref 137–147)
SODIUM: 136 meq/L — AB (ref 137–147)
Sodium: 136 mEq/L — ABNORMAL LOW (ref 137–147)
Sodium: 134 mEq/L — ABNORMAL LOW (ref 137–147)
Sodium: 133 mEq/L — ABNORMAL LOW (ref 137–147)
Sodium: 135 mEq/L — ABNORMAL LOW (ref 137–147)

## 2014-05-17 LAB — TROPONIN I
Troponin I: <0.3 ng/mL (ref <0.30)
Troponin I: <0.3 ng/mL (ref <0.30)
Troponin I: <0.3 ng/mL (ref <0.30)

## 2014-05-17 LAB — GLUCOSE, CAPILLARY
GLUCOSE-CAPILLARY: 164 mg/dL — AB (ref 70–99)
GLUCOSE-CAPILLARY: 170 mg/dL — AB (ref 70–99)
GLUCOSE-CAPILLARY: 139 mg/dL — AB (ref 70–99)
GLUCOSE-CAPILLARY: 228 mg/dL — AB (ref 70–99)
GLUCOSE-CAPILLARY: 119 mg/dL — AB (ref 70–99)
GLUCOSE-CAPILLARY: 154 mg/dL — AB (ref 70–99)
Glucose-Capillary: 211 mg/dL — ABNORMAL HIGH (ref 70–99)
Glucose-Capillary: 170 mg/dL — ABNORMAL HIGH (ref 70–99)
Glucose-Capillary: 154 mg/dL — ABNORMAL HIGH (ref 70–99)
Glucose-Capillary: 172 mg/dL — ABNORMAL HIGH (ref 70–99)
Glucose-Capillary: 142 mg/dL — ABNORMAL HIGH (ref 70–99)
Glucose-Capillary: 198 mg/dL — ABNORMAL HIGH (ref 70–99)
Glucose-Capillary: 123 mg/dL — ABNORMAL HIGH (ref 70–99)
Glucose-Capillary: 162 mg/dL — ABNORMAL HIGH (ref 70–99)
Glucose-Capillary: 176 mg/dL — ABNORMAL HIGH (ref 70–99)
Glucose-Capillary: 191 mg/dL — ABNORMAL HIGH (ref 70–99)
Glucose-Capillary: 201 mg/dL — ABNORMAL HIGH (ref 70–99)
Glucose-Capillary: 153 mg/dL — ABNORMAL HIGH (ref 70–99)
Glucose-Capillary: 91 mg/dL (ref 70–99)

## 2014-05-17 MED ORDER — POTASSIUM CHLORIDE CRYS ER 20 MEQ PO TBCR
40.0000 meq | EXTENDED_RELEASE_TABLET | Freq: Once | ORAL | Status: AC
  Administered 2014-05-18: 40 meq via ORAL
  Filled 2014-05-17: qty 2

## 2014-05-17 MED ORDER — INSULIN ASPART 100 UNIT/ML ~~LOC~~ SOLN: 0.0000 [IU] | Freq: Three times a day (TID) | SUBCUTANEOUS | Status: DC

## 2014-05-17 MED ORDER — CALCIUM CARBONATE ANTACID 500 MG PO CHEW
1.0000 | CHEWABLE_TABLET | Freq: Every day | ORAL | Status: DC
  Administered 2014-05-17 – 2014-05-20 (×4): 200 mg via ORAL
  Filled 2014-05-17 (×5): qty 1

## 2014-05-17 MED ORDER — POTASSIUM CHLORIDE CRYS ER 20 MEQ PO TBCR
40.0000 meq | EXTENDED_RELEASE_TABLET | Freq: Once | ORAL | Status: AC
  Administered 2014-05-17: 40 meq via ORAL

## 2014-05-17 MED ORDER — INSULIN GLARGINE 100 UNIT/ML ~~LOC~~ SOLN
20.0000 [IU] | Freq: Every day | SUBCUTANEOUS | Status: DC
  Administered 2014-05-18: 20 [IU] via SUBCUTANEOUS
  Filled 2014-05-17: qty 0.2

## 2014-05-17 MED ORDER — INSULIN STARTER KIT- PEN NEEDLES (ENGLISH)
1.0000 | Freq: Once | Status: AC
Start: 2014-05-17 — End: 2014-05-17
  Administered 2014-05-17: 1
  Filled 2014-05-17: qty 1

## 2014-05-17 MED ORDER — ACETAMINOPHEN 325 MG PO TABS
650.0000 mg | ORAL_TABLET | Freq: Four times a day (QID) | ORAL | Status: DC | PRN
  Administered 2014-05-19: 650 mg via ORAL
  Filled 2014-05-17 (×2): qty 2

## 2014-05-17 MED ORDER — BISMUTH SUBSALICYLATE 262 MG/15ML PO SUSP
30.0000 mL | ORAL | Status: DC | PRN
  Filled 2014-05-17: qty 236

## 2014-05-17 MED ORDER — LIVING WELL WITH DIABETES BOOK
Freq: Once | Status: AC
  Administered 2014-05-17: 1
  Filled 2014-05-17: qty 1

## 2014-05-17 MED ORDER — INSULIN GLARGINE 100 UNIT/ML ~~LOC~~ SOLN: 20.0000 [IU] | Freq: Every day | SUBCUTANEOUS | Status: DC

## 2014-05-17 NOTE — Progress Notes (Signed)
Subjective:  Patient states that overnight she was not able to sleep much at all. She did say that she feels much better today compared with yesterday. She had a critical value BMET last night, but the STAT repeat was acceptable. She denies any chest pain, headaches, vision changes, abdominal pain, or polydipsia. She does endorse some shortness of breath when she gets up to walk around, and also some nausea and vomiting constantly, but especially when she takes her medications. She has not had a BM since her hospitalization began.  Objective: Vital signs in last 24 hours: Filed Vitals:   05/17/14 0039 05/17/14 0355 05/17/14 0746 05/17/14 1011  BP: 136/68 143/90 134/86   Pulse: 92 100 102 74  Temp:  98.3 F (36.8 C) 98.5 F (36.9 C)   TempSrc:  Oral Oral   Resp: $Remo'16 15 14   'kHGHq$ Height:      Weight:  96.9 kg (213 lb 10 oz)    SpO2: 98% 99% 100%    Weight change: 0.6 kg (1 lb 5.2 oz)  Intake/Output Summary (Last 24 hours) at 05/17/14 1104 Last data filed at 05/17/14 0807  Gross per 24 hour  Intake   2515 ml  Output   1550 ml  Net    965 ml   BP 134/86 mmHg  Pulse 74  Temp(Src) 98.5 F (36.9 C) (Oral)  Resp 14  Ht $R'5\' 7"'dI$  (1.702 m)  Wt 96.9 kg (213 lb 10 oz)  BMI 33.45 kg/m2  SpO2 100%   Vital signs reviewed and are stable. General appearance: alert, cooperative, fatigued and mild distress Lungs: clear to auscultation bilaterally Heart: Normal rate, regular rhythm. Nrmal S1/S2. No m/r/g. Abdomen: Mild tenderness to palpation in epigastric region. Extremities: extremities normal, atraumatic, no cyanosis or edema. Calves tender to palpation bilaterally. Neurologic: Mental status: Alert, oriented, thought content appropriate   Lab Results: Basic Metabolic Panel:  Recent Labs Lab 05/14/14 2040  05/17/14 0415 05/17/14 0613  NA 148*  < > 133*  133* 136*  K 4.3  < > 4.1  4.5 4.3  CL 109  < > 102  102 105  CO2 8*  < > 17*  18* 17*  GLUCOSE 185*  < > 181*  178* 152*    BUN 29*  < > $R'6  6 6  'KL$ CREATININE 1.17*  < > 0.79  0.80 0.78  CALCIUM 9.6  < > 8.9  8.9 8.8  MG 2.5  --   --   --   PHOS 1.7*  --   --   --   < > = values in this interval not displayed.  CBC:  Recent Labs Lab 05/14/14 1230 05/16/14 0550  WBC 7.7 8.6  HGB 14.6 13.5  HCT 44.0 39.7  MCV 88.0 85.7  PLT 283 192   Cardiac Enzymes:  Recent Labs Lab 05/16/14 1810 05/17/14 0100 05/17/14 0613  TROPONINI <0.30 <0.30 <0.30   BNP:  Recent Labs Lab 05/14/14 1230  PROBNP 258.6*   CBG:  Recent Labs Lab 05/17/14 0020 05/17/14 0134 05/17/14 0243 05/17/14 0354 05/17/14 0505 05/17/14 0636  GLUCAP 211* 164* 170* 170* 172* 154*   Hemoglobin A1C:  Recent Labs Lab 05/14/14 2040  HGBA1C 15.0*   Thyroid Function Tests:  Recent Labs Lab 05/14/14 2040  05/16/14 0550  TSH 0.060*  --   --   FREET4  --   < > 1.33  < > = values in this interval not displayed.  Urinalysis:  Recent Labs  Lab 05/14/14 1703 05/15/14 2000  COLORURINE YELLOW YELLOW  LABSPEC 1.025 1.020  PHURINE 5.0 6.0  GLUCOSEU >1000* >1000*  HGBUR TRACE* TRACE*  BILIRUBINUR NEGATIVE NEGATIVE  KETONESUR >80* 40*  PROTEINUR NEGATIVE 100*  UROBILINOGEN 0.2 0.2  NITRITE NEGATIVE NEGATIVE  LEUKOCYTESUR NEGATIVE NEGATIVE   Micro Results: Recent Results (from the past 240 hour(s))  MRSA PCR Screening     Status: None   Collection Time: 05/14/14  6:04 PM  Result Value Ref Range Status   MRSA by PCR NEGATIVE NEGATIVE Final    Comment:        The GeneXpert MRSA Assay (FDA approved for NASAL specimens only), is one component of a comprehensive MRSA colonization surveillance program. It is not intended to diagnose MRSA infection nor to guide or monitor treatment for MRSA infections.    Medications: I have reviewed the patient's current medications. Scheduled Meds: . amLODipine  10 mg Oral QHS  . antiseptic oral rinse  7 mL Mouth Rinse BID  . calcium carbonate  1 tablet Oral Daily  . insulin  starter kit- pen needles  1 kit Other Once  . metoprolol tartrate  12.5 mg Oral BID  . mycophenolate  500 mg Oral BID  . predniSONE  5 mg Oral Daily  . tacrolimus  4.5 mg Oral BID   Continuous Infusions: . sodium chloride 1,000 mL (05/14/14 1501)  . dextrose 5 % and 0.45% NaCl 125 mL/hr (05/16/14 1307)  . insulin (NOVOLIN-R) infusion 4.1 Units/hr (05/17/14 1000)   PRN Meds:.ondansetron (ZOFRAN) IV, promethazine   Assessment/Plan: Active Problems:   DKA (diabetic ketoacidoses)   Diabetic ketoacidosis  68 y.o. Female with PMHx of DM II, HTN, kidney transplantation, and DVT s/p IVC filter placement who was admitted for DKA.  1.) DKA: Sugars are improving. Last measured was 178. AG is still elevated at 13. Etiology of DKA uncertain. She had been well-controlled with A1c in the 7s as of August this year. Most recent A1c was 15.  - Continue insulin drip  - Continue D5 1/2 NS at 125cc/hr until AG closes  - NPO except sips w/ meds  - BMET q 4hrs until AG closes  - Anti-GAD Ab pending.  - Explained to pt that she will likely require insulin therapy at home as C-peptide level was 0.08.    - Diabetes educator recommended 20-30u lantus/levemir qd for basal.  2.) Hypokalemia: Potassium repleted x 10 (7 doses of IV 10 mEq, 3 doses of PO 40 mEq). Currently at 4.5.   - Monitor. No additional repletion warranted currently.  3.) HTN: Patient having some mild range blood pressures (most recent 143/90). Patient takes amlodipine and lasix at home.  - Continue amlodipine 10mg  QD  - Added Metoprolol 12.5mg  BID   4.) ESRD s/p renal transplantation: Patient received deceased donor transplant in 24-Sep-2008 at [facility], functioning well. Cr 0.80 this AM.    - Continue Cellcept 500 mg bid + Tacrolimus 4.5 mg bid + Prednisone 5 mg qd  5.) Hypothyroidism: TSH was 0.060, last Free T4 was 1.33. Low TSH likely d/t sick euthyroid syndrome.  - Continue home Synthroid. Recheck TSH outpatient.  6.) DVT/PE  Ppx: SCDs, IVC filter in place  Dispo: Disposition is deferred at this time, awaiting improvement of current medical problems.  Anticipated discharge in approximately 1-2 day(s).   The patient does not have a current PCP (No primary care provider on file.) and does need an [facility] hospital follow-up appointment after discharge.  The patient does have  transportation limitations that hinder transportation to clinic appointments.  Services Needed at time of discharge: Y = Yes, Blank = No PT:   OT:   RN:   Equipment:   Other:     LOS: 3 days   Farrell Ours, Med Student 05/17/2014, 11:04 AM

## 2014-05-17 NOTE — Progress Notes (Signed)
Internal Medicine Attending  Date: 05/17/2014  Patient name: [name] Medical record number: [id number] Date of birth: [birth year] Age: 68 y.o. Gender: female  I saw and evaluated the patienton A.M rounds, and discussed her care with resident .  I reviewed the resident's note by Dr. Marvel Plan and I agree with the resident's findings and plans as documented in her note, with the following additional comments.  Because of ongoing nausea, patient did not feel that she could take even a clear liquid diet by mouth.  Goal is to transition off of IV insulin and IV dextrose to subcutaneous regimen and oral diet as soon as able.

## 2014-05-17 NOTE — Progress Notes (Addendum)
Inpatient Diabetes Program Recommendations  AACE/ADA: New Consensus Statement on Inpatient Glycemic Control (2013)  Target Ranges:  Prepandial:   less than 140 mg/dL      Peak postprandial:   less than 180 mg/dL (1-2 hours)      Critically ill patients:  140 - 180 mg/dL   Consult received for insulin recommendations for pt new to insulin. Pt weight at 97 kg: at 0.2 units/kg, the starting basal dose is approximately 20 units, at 0.4 units/kg, the total basal calculates to 40 units basal per day. Patient's average drip rate over the past 6 hours (while NPO is at an average of 3 units/hr which calculates to 72 units basal per day, but would not start with that either.  Recommendation for basal to start would be at 20- 30 units lantus/levemir per day. Please start the basal insulin at 2 hrs prior to discontinuation of insulin drip and start resistant correction insulin at the same time the drip is discontinued-give concurrently with stopping the drip. Pt may need some meal coverage, but would recommend using the resistant correction tidwc (once eating) and the HS scale at bedtime. If pt remains NPO, would use the resistant correction q 4 hrs until eating.  Will order insulin pen starter kit and request RN's to begin instruction-Can use the system video network for further instructions re diet, insulin, and basic survival skills. Will order the Ed booklet for further detail. Will see patient and discuss the need for insulin and the advantages to using insulin. AD, if pt is still NPO when drip is d/c'd, would recommend the sensitive q 4 hrs. (Once eating, could increase) Thank you, Rosita Kea, RN, CNS, Diabetes Coordinator [phone])

## 2014-05-17 NOTE — Care Management Note (Signed)
    Page 1 of 1   05/17/2014     3:29:34 PM CARE MANAGEMENT NOTE 05/17/2014  Patient:  [name], [name]   Account Number:  [id number]  Date Initiated:  05/17/2014  Documentation initiated by:  Carel Schnee  Subjective/Objective Assessment:   dx DKA; lives with son and sister     Anticipated DC Date:  05/21/2014   Anticipated DC Plan:  [place]  CM consult      [facility]  HOME HEALTH   Status of service:  In process, will continue to follow Medicare Important Message given?  YES (If response is "NO", the following Medicare IM given date fields will be blank) Date Medicare IM given:  05/17/2014 Medicare IM given by:  Chayden Garrelts Date Additional Medicare IM given:   Additional Medicare IM given by:    Per UR Regulation:  Reviewed for med. necessity/level of care/duration of stay  Comments:  05/17/14 [place] MSN BSN CCM Pt states she has been followed @ [facility] but prefers to follow with PCP in [place].  She had an appt with Dr Barbette Merino and had arranged transportation for tomorrow.  She has been trying to call to cancel and has been unable to get through on the room phone.  CM placed calls and cancelled both appts, left message with Dr Jonelle Sidle that pt plans to request an appt for hospital follow-up.  Discussed home health RN for disease management and pt agrees tol consider same.

## 2014-05-17 NOTE — Progress Notes (Signed)
Subjective:  Patient was seen and examined this morning. Patient continues to complain of nausea, but denies vomiting, chills, fever, diarrhea, increased thirst and urination. Nausea is not well controlled with Zofran or Phenergan. Patient states she is unsure if she is hungry.  Objective: Vital signs in last 24 hours: Filed Vitals:   05/17/14 0355 05/17/14 0746 05/17/14 1011 05/17/14 1133  BP: 143/90 134/86  154/92  Pulse: 100 102 74 98  Temp: 98.3 F (36.8 C) 98.5 F (36.9 C)  98 F (36.7 C)  TempSrc: Oral Oral  Oral  Resp: _0 Height:      Weight: 96.9 kg (213 lb 10 oz)     SpO2: 99% 100%  100%   Weight change: 0.6 kg (1 lb 5.2 oz)  Intake/Output Summary (Last 24 hours) at 05/17/14 1236 Last data filed at 05/17/14 0807  Gross per 24 hour  Intake   2390 ml  Output   1550 ml  Net    840 ml   General: Vital signs reviewed. Patient is well-developed and well-nourished, in no distress and cooperative with exam.  HEENT: Moist mucous membranes. Poor dentition. No ketone breath. Cardiovascular: Tachycardic, regular rate, S1 normal, S2 normal, no murmurs, gallops, or rubs. Pulmonary/Chest: Clear to auscultation bilaterally, no wheezes, rales, or rhonchi. Abdominal: Soft, non-tender, obese, non-distended, BS +, no masses, organomegaly, or guarding present.  Musculoskeletal: No joint deformities, erythema, or stiffness, ROM full and nontender. Extremities: No lower extremity edema bilaterally, pulses symmetric and intact bilaterally. No cyanosis or clubbing. Neurological: A&O x3 Skin: No erythema, rashes, or wounds.  Psychiatric: Normal mood and affect. speech and behavior is normal. Cognition and memory are normal.   Lab Results: Basic Metabolic Panel:  Recent Labs Lab 05/14/14 2040  05/17/14 0613 05/17/14 0943  NA 148*  < > 136* 136*  K 4.3  < > 4.3 4.2  CL 109  < > 105 103  CO2 8*  < > 17* 17*  GLUCOSE 185*  < > 152* 191*  BUN 29*  < > 6 7  CREATININE  1.17*  < > 0.78 0.89  CALCIUM 9.6  < > 8.8 9.0  MG 2.5  --   --   --   PHOS 1.7*  --   --   --   < > = values in this interval not displayed. CBC:  Recent Labs Lab 05/14/14 1230 05/16/14 0550  WBC 7.7 8.6  HGB 14.6 13.5  HCT 44.0 39.7  MCV 88.0 85.7  PLT 283 192   Cardiac Enzymes:  Recent Labs Lab 05/16/14 1810 05/17/14 0100 05/17/14 0613  TROPONINI <0.30 <0.30 <0.30   BNP:  Recent Labs Lab 05/14/14 1230  PROBNP 258.6*   CBG:  Recent Labs Lab 05/17/14 0020 05/17/14 0134 05/17/14 0243 05/17/14 0354 05/17/14 0505 05/17/14 0636  GLUCAP 211* 164* 170* 170* 172* 154*   Hemoglobin A1C:  Recent Labs Lab 05/14/14 2040  HGBA1C 15.0*   Thyroid Function Tests:  Recent Labs Lab 05/14/14 2040  05/16/14 0550  TSH 0.060*  --   --   FREET4  --   < > 1.33  < > = values in this interval not displayed. Urinalysis:  Recent Labs Lab 05/14/14 1703 05/15/14 2000  COLORURINE YELLOW YELLOW  LABSPEC 1.025 1.020  PHURINE 5.0 6.0  GLUCOSEU >1000* >1000*  HGBUR TRACE* TRACE*  BILIRUBINUR NEGATIVE NEGATIVE  KETONESUR >80* 40*  PROTEINUR NEGATIVE 100*  UROBILINOGEN 0.2 0.2  NITRITE NEGATIVE NEGATIVE  LEUKOCYTESUR NEGATIVE NEGATIVE    Micro Results: Recent Results (from the past 240 hour(s))  MRSA PCR Screening     Status: None   Collection Time: 05/14/14  6:04 PM  Result Value Ref Range Status   MRSA by PCR NEGATIVE NEGATIVE Final    Comment:        The GeneXpert MRSA Assay (FDA approved for NASAL specimens only), is one component of a comprehensive MRSA colonization surveillance program. It is not intended to diagnose MRSA infection nor to guide or monitor treatment for MRSA infections.    Studies/Results: No results found. Medications:  I have reviewed the patient's current medications. Prior to Admission:  Prescriptions prior to admission  Medication Sig Dispense Refill Last Dose  . amLODipine (NORVASC) 10 MG tablet Take 10 mg by mouth at  bedtime.    05/13/2014 at Unknown time  . aspirin EC 81 MG tablet Take 81 mg by mouth every evening.   05/13/2014 at Unknown time  . furosemide (LASIX) 40 MG tablet Take 40 mg by mouth daily.   05/13/2014 at Unknown time  . glipiZIDE (GLUCOTROL XL) 2.5 MG 24 hr tablet Take 2.5 mg by mouth daily.   05/13/2014 at Unknown time  . Magnesium 500 MG TABS Take 500 mg by mouth daily.   05/13/2014 at Unknown time  . Multiple Vitamin (MULTIVITAMIN WITH MINERALS) TABS tablet Take 1 tablet by mouth daily.   05/13/2014 at Unknown time  . mycophenolate (CELLCEPT) 250 MG capsule Take 500 mg by mouth 2 (two) times daily.   05/13/2014 at Unknown time  . pantoprazole (PROTONIX) 40 MG tablet Take 40 mg by mouth daily.   05/13/2014 at Unknown time  . potassium chloride (KLOR-CON) 8 MEQ tablet Take 8 mEq by mouth daily.   05/13/2014 at Unknown time  . pravastatin (PRAVACHOL) 40 MG tablet Take 40 mg by mouth every evening.   05/13/2014 at Unknown time  . predniSONE (DELTASONE) 5 MG tablet Take 5 mg by mouth daily.   05/13/2014 at Unknown time  . sodium bicarbonate 650 MG tablet Take 650 mg by mouth as needed for heartburn.   05/13/2014 at Unknown time  . sulfamethoxazole-trimethoprim (BACTRIM,SEPTRA) 400-80 MG per tablet Take 1 tablet by mouth every Monday, Wednesday, and Friday.   Past Week at Unknown time  . tacrolimus (PROGRAF) 0.5 MG capsule Take 0.5 mg by mouth 2 (two) times daily. Takes total of 4.68m twice daily   05/13/2014 at Unknown time  . tacrolimus (PROGRAF) 1 MG capsule Take 4 mg by mouth 2 (two) times daily. Takes 4.548mtotal twice daily   05/13/2014 at Unknown time  . [DISCONTINUED] levothyroxine (SYNTHROID, LEVOTHROID) 100 MCG tablet Take 100 mcg by mouth daily.   05/13/2014 at Unknown time   Scheduled Meds: . amLODipine  10 mg Oral QHS  . antiseptic oral rinse  7 mL Mouth Rinse BID  . calcium carbonate  1 tablet Oral Daily  . insulin starter kit- pen needles  1 kit Other Once  . metoprolol tartrate   12.5 mg Oral BID  . mycophenolate  500 mg Oral BID  . predniSONE  5 mg Oral Daily  . tacrolimus  4.5 mg Oral BID   Continuous Infusions: . sodium chloride 1,000 mL (05/14/14 1501)  . dextrose 5 % and 0.45% NaCl 125 mL/hr (05/16/14 1307)  . insulin (NOVOLIN-R) infusion 6.6 Units/hr (05/17/14 1210)   PRN Meds:.bismuth subsalicylate, ondansetron (ZOFRAN) IV, promethazine Assessment/Plan: Active Problems:   DKA (diabetic ketoacidoses)   Diabetic  ketoacidosis  DKA: DM type II in the setting of steroid use initially. HbA1c in 01/2014 7.5, now 15. C-peptide low at 0.08. Continues to be on insulin gtt. Most recent AG 14>16, HCO3 17. Slow improvement. Patient complains of uncontrolled nausea, not controlled with phenergan or zofran.  -Continue Insulin gtt -Once AG closes x 2, will transition to Lantus 20 units and continue drip for 1-2 more hours -Zofran 4 mg Q6H prn -D5-1/2 NS at 125 mL/hr  -BMET Q4H until gap closes and off gtt -NPO for now -Anti-GAD pending -Supplement KDur 40 mEq -Tums prn -Bismuth Subsalicylate 30 mL K2H prn  HTN: Blood pressure better controlled 130/80s-150s/80s. -Continue Amlodipine 10 mg qd -Continue Metoprolol 12.5 mg bid  H/o Renal Transplant: H/o donor transplant in 2010 at [facility], functioning well. Cr 0.78 this AM.  -Continue Cellcept 500 mg bid  -Continue Tacrolimus 4.5 mg bid -Continue Prednisone 5 mg qd  Hypothyroidism: Patient is on levothyroxine 100 mcg daily at home. TSH 0.06, Free T4 normal at 1.33. Low TSH most likely secondary to sick euthyroid syndrome.   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 On ASA 81 mg daily at home.  DVT/PE ppx: SCDs  Dispo: Disposition is deferred at this time, awaiting improvement of current medical problems.  Anticipated discharge in approximately 1-2 day(s).   The patient does have a current PCP (No primary care provider on file.) and does need an [facility] hospital follow-up appointment after  discharge.  The patient does have transportation limitations that hinder transportation to clinic appointments.  .Services Needed at time of discharge: Y = Yes, Blank = No PT:   OT:   RN:   Equipment:   Other:     LOS: 3 days    Osa Craver, DO PGY-1 Internal Medicine Resident Pager # [phone] 05/17/2014 12:36 PM

## 2014-05-17 NOTE — Progress Notes (Signed)
Spoke with Diabetes coordinator, Rosita Kea, RN regarding insulin drip and she recommended 20-30 units of Lantus or Levemir and start her on a moderate sliding scale and a diet.  Dr. Osa Craver was paged and informed her of the Diabetes Coordinator's recommendation.  At this time, MD wants to continue the drip.

## 2014-05-17 NOTE — Progress Notes (Signed)
Inpatient Diabetes Program Recommendations  AACE/ADA: New Consensus Statement on Inpatient Glycemic Control (2013)  Target Ranges:  Prepandial:   less than 140 mg/dL      Peak postprandial:   less than 180 mg/dL (1-2 hours)      Critically ill patients:  140 - 180 mg/dL   Spoke with patient about intiating insulin for home regimen. Pt keeps stating that she's 'sorry she couldn't stay on the pill she was on.' I explained to her that adding insulin to her regimen is a good thing, that insulin is protective and will rest her pancreas/beta cells from having to make insulin. Her C-peptide level is low which is typically normal when the pancreas has been stressed to make insulin.   Assured her that nothing that has happened is her fault, as she seems to constantly apologize for 'allowing this to happen'.  She is extremely tired today and just now accepting that insulin is okay. She states her family is upset about this and she has tried to keep it from her sister. Pt will need a lot of encouragement to accept the need for injections at this time.  She may in time not need the insulin once her body recovers. Will teach the insulin pen use to pt tomorrow when hopefully will be more receptive and rested to learn. I think her family will need to learn also. Will follow - up tomorrow. Have written recommendations for transition off the insulin dirp in previous note. Pt still NPO. She can start po if carbohydrate grams are entered into the [employer], which the RN's know how to do.  She probably needs some clear liquids to start when stable.  Thank you, Rosita Kea, RN, CNS, Diabetes Coordinator [phone])

## 2014-05-17 NOTE — Plan of Care (Signed)
Problem: Phase I Progression Outcomes Goal: OOB as tolerated unless otherwise ordered Outcome: Completed/Met Date Met:  05/17/14     

## 2014-05-17 NOTE — Plan of Care (Addendum)
Problem: Limited Adherence to Nutrition-Related Recommendations (NB-1.6) Goal: Nutrition education Formal process to instruct or train a patient/client in a skill or to impart knowledge to help patients/clients voluntarily manage or modify food choices and eating behavior to maintain or improve health. Outcome: Completed/Met Date Met:  05/17/14  RD consulted for nutrition education regarding diabetes.     CBG (last 3)   Recent Labs  05/17/14 0855 05/17/14 0958 05/17/14 1104  GLUCAP 139* 198* 228*    RD provided "Carbohydrate Counting for People with Diabetes" handout from the Academy of Nutrition and Dietetics. Discussed different food groups and their effects on blood sugar, emphasizing carbohydrate-containing foods. Provided list of carbohydrates and recommended serving sizes of common foods.  Discussed importance of controlled and consistent carbohydrate intake throughout the day. Provided examples of ways to balance meals/snacks and encouraged intake of high-fiber, whole grain complex carbohydrates. Teach back method used.  Expect fair compliance.  Body mass index is 33.45 kg/(m^2). Pt meets criteria for Obesity Class I based on current BMI.  Current diet order is NPO.  Labs and medications reviewed.  No further nutrition interventions warranted at this time.  If additional nutrition issues arise, please re-consult RD.  Arthur Holms, RD, LDN Pager #: [phone] After-Hours Pager #: [phone]

## 2014-05-17 NOTE — Clinical Documentation Improvement (Signed)
Potassium 3.3 on 05/16/14 and 2.8 05/17/14 treated with potassium po and IV.  Please identify any clinical conditions associated with the abnormal potassium level and document in your progress note and carry over to the discharge summary.  Component      Potassium  Latest Ref Rng      3.7 - 5.3 mEq/L  05/16/2014      1:40 PM 3.3 (L)  05/16/2014      6:10 PM 4.3  05/16/2014      9:38 PM 3.6 (L)  05/17/2014      1:00 AM 2.8 (LL)  05/17/2014      4:15 AM 4.1  05/17/2014      4:15 AM 4.5  05/17/2014      6:13 AM 4.3   Possible Clinical Conditions: -Hypokalemia -Other condition (please specify) -Unable to Determine at Present  Thank you, Mateo Flow, RN [phone] Clinical Documentation Specialist

## 2014-05-17 NOTE — Progress Notes (Signed)
Utilization Review Completed.  

## 2014-05-18 LAB — BASIC METABOLIC PANEL
ANION GAP: 12 (ref 5–15)
Anion gap: 17 — ABNORMAL HIGH (ref 5–15)
Anion gap: 15 (ref 5–15)
Anion gap: 15 (ref 5–15)
Anion gap: 15 (ref 5–15)
Anion gap: 13 (ref 5–15)
BUN: 7 mg/dL (ref 6–23)
BUN: 7 mg/dL (ref 6–23)
BUN: 7 mg/dL (ref 6–23)
BUN: 8 mg/dL (ref 6–23)
BUN: 9 mg/dL (ref 6–23)
BUN: 9 mg/dL (ref 6–23)
CALCIUM: 8.7 mg/dL (ref 8.4–10.5)
CALCIUM: 8.8 mg/dL (ref 8.4–10.5)
CALCIUM: 9.3 mg/dL (ref 8.4–10.5)
CALCIUM: 9.1 mg/dL (ref 8.4–10.5)
CHLORIDE: 98 meq/L (ref 96–112)
CHLORIDE: 104 meq/L (ref 96–112)
CO2: 17 mEq/L — ABNORMAL LOW (ref 19–32)
CO2: 20 meq/L (ref 19–32)
CO2: 18 mEq/L — ABNORMAL LOW (ref 19–32)
CO2: 18 meq/L — AB (ref 19–32)
CO2: 18 meq/L — AB (ref 19–32)
CO2: 20 meq/L (ref 19–32)
CREATININE: 1.06 mg/dL (ref 0.50–1.10)
Calcium: 8.9 mg/dL (ref 8.4–10.5)
Calcium: 9.1 mg/dL (ref 8.4–10.5)
Chloride: 102 mEq/L (ref 96–112)
Chloride: 102 mEq/L (ref 96–112)
Chloride: 99 mEq/L (ref 96–112)
Chloride: 97 mEq/L (ref 96–112)
Creatinine, Ser: 0.94 mg/dL (ref 0.50–1.10)
Creatinine, Ser: 0.88 mg/dL (ref 0.50–1.10)
Creatinine, Ser: 1.04 mg/dL (ref 0.50–1.10)
Creatinine, Ser: 1.02 mg/dL (ref 0.50–1.10)
Creatinine, Ser: 1.15 mg/dL — ABNORMAL HIGH (ref 0.50–1.10)
GFR calc Af Amer: 78 mL/min — ABNORMAL LOW (ref >90)
GFR calc Af Amer: 62 mL/min — ABNORMAL LOW (ref >90)
GFR calc Af Amer: 63 mL/min — ABNORMAL LOW (ref >90)
GFR calc Af Amer: 65 mL/min — ABNORMAL LOW (ref >90)
GFR calc Af Amer: 56 mL/min — ABNORMAL LOW (ref >90)
GFR calc non Af Amer: 62 mL/min — ABNORMAL LOW (ref >90)
GFR calc non Af Amer: 67 mL/min — ABNORMAL LOW (ref >90)
GFR calc non Af Amer: 55 mL/min — ABNORMAL LOW (ref >90)
GFR calc non Af Amer: 56 mL/min — ABNORMAL LOW (ref >90)
GFR calc non Af Amer: 48 mL/min — ABNORMAL LOW (ref >90)
GFR, EST AFRICAN AMERICAN: 72 mL/min — AB (ref >90)
GFR, EST NON AFRICAN AMERICAN: 53 mL/min — AB (ref >90)
GLUCOSE: 181 mg/dL — AB (ref 70–99)
GLUCOSE: 159 mg/dL — AB (ref 70–99)
GLUCOSE: 199 mg/dL — AB (ref 70–99)
GLUCOSE: 212 mg/dL — AB (ref 70–99)
Glucose, Bld: 196 mg/dL — ABNORMAL HIGH (ref 70–99)
Glucose, Bld: 90 mg/dL (ref 70–99)
POTASSIUM: 3.8 meq/L (ref 3.7–5.3)
POTASSIUM: 3.8 meq/L (ref 3.7–5.3)
POTASSIUM: 4.1 meq/L (ref 3.7–5.3)
Potassium: 3.8 mEq/L (ref 3.7–5.3)
Potassium: 4.3 mEq/L (ref 3.7–5.3)
Potassium: 4.2 mEq/L (ref 3.7–5.3)
SODIUM: 136 meq/L — AB (ref 137–147)
SODIUM: 135 meq/L — AB (ref 137–147)
SODIUM: 132 meq/L — AB (ref 137–147)
SODIUM: 130 meq/L — AB (ref 137–147)
Sodium: 132 mEq/L — ABNORMAL LOW (ref 137–147)
Sodium: 135 mEq/L — ABNORMAL LOW (ref 137–147)

## 2014-05-18 LAB — GLUCOSE, CAPILLARY
GLUCOSE-CAPILLARY: 107 mg/dL — AB (ref 70–99)
GLUCOSE-CAPILLARY: 112 mg/dL — AB (ref 70–99)
GLUCOSE-CAPILLARY: 188 mg/dL — AB (ref 70–99)
GLUCOSE-CAPILLARY: 192 mg/dL — AB (ref 70–99)
GLUCOSE-CAPILLARY: 221 mg/dL — AB (ref 70–99)
Glucose-Capillary: 220 mg/dL — ABNORMAL HIGH (ref 70–99)
Glucose-Capillary: 156 mg/dL — ABNORMAL HIGH (ref 70–99)
Glucose-Capillary: 130 mg/dL — ABNORMAL HIGH (ref 70–99)
Glucose-Capillary: 99 mg/dL (ref 70–99)
Glucose-Capillary: 129 mg/dL — ABNORMAL HIGH (ref 70–99)
Glucose-Capillary: 152 mg/dL — ABNORMAL HIGH (ref 70–99)
Glucose-Capillary: 185 mg/dL — ABNORMAL HIGH (ref 70–99)
Glucose-Capillary: 212 mg/dL — ABNORMAL HIGH (ref 70–99)
Glucose-Capillary: 218 mg/dL — ABNORMAL HIGH (ref 70–99)
Glucose-Capillary: 161 mg/dL — ABNORMAL HIGH (ref 70–99)
Glucose-Capillary: 166 mg/dL — ABNORMAL HIGH (ref 70–99)

## 2014-05-18 LAB — TROPONIN I
Troponin I: <0.3 ng/mL (ref <0.30)
Troponin I: <0.3 ng/mL (ref <0.30)
Troponin I: <0.3 ng/mL (ref <0.30)

## 2014-05-18 LAB — CBC
HCT: 38.5 % (ref 36.0–46.0)
HEMOGLOBIN: 12.8 g/dL (ref 12.0–15.0)
MCH: 28.5 pg (ref 26.0–34.0)
MCHC: 33.2 g/dL (ref 30.0–36.0)
MCV: 85.7 fL (ref 78.0–100.0)
PLATELETS: 125 10*3/uL — AB (ref 150–400)
RBC: 4.49 MIL/uL (ref 3.87–5.11)
RDW: 15 % (ref 11.5–15.5)
WBC: 7.8 10*3/uL (ref 4.0–10.5)

## 2014-05-18 MED ORDER — INSULIN GLARGINE 100 UNIT/ML ~~LOC~~ SOLN
20.0000 [IU] | Freq: Every day | SUBCUTANEOUS | Status: DC
Start: 2014-05-18 — End: 2014-05-20
  Administered 2014-05-18 – 2014-05-19 (×2): 20 [IU] via SUBCUTANEOUS
  Filled 2014-05-18 (×3): qty 0.2

## 2014-05-18 MED ORDER — SODIUM CHLORIDE 0.9 % IV SOLN
INTRAVENOUS | Status: DC
  Administered 2014-05-18: 01:00:00 via INTRAVENOUS

## 2014-05-18 MED ORDER — INSULIN ASPART 100 UNIT/ML ~~LOC~~ SOLN
0.0000 [IU] | Freq: Three times a day (TID) | SUBCUTANEOUS | Status: DC
  Administered 2014-05-19 (×2): 3 [IU] via SUBCUTANEOUS
  Administered 2014-05-19: 2 [IU] via SUBCUTANEOUS

## 2014-05-18 MED ORDER — INSULIN ASPART 100 UNIT/ML ~~LOC~~ SOLN
0.0000 [IU] | Freq: Three times a day (TID) | SUBCUTANEOUS | Status: DC
  Administered 2014-05-18: 3 [IU] via SUBCUTANEOUS

## 2014-05-18 MED ORDER — INSULIN ASPART 100 UNIT/ML ~~LOC~~ SOLN
0.0000 [IU] | Freq: Every day | SUBCUTANEOUS | Status: DC
  Administered 2014-05-19: 2 [IU] via SUBCUTANEOUS

## 2014-05-18 MED ORDER — INSULIN GLARGINE 100 UNIT/ML ~~LOC~~ SOLN
10.0000 [IU] | Freq: Once | SUBCUTANEOUS | Status: AC
  Administered 2014-05-18: 10 [IU] via SUBCUTANEOUS
  Filled 2014-05-18: qty 0.1

## 2014-05-18 MED ORDER — PANTOPRAZOLE SODIUM 20 MG PO TBEC
20.0000 mg | DELAYED_RELEASE_TABLET | Freq: Every day | ORAL | Status: DC
  Administered 2014-05-18 – 2014-05-20 (×2): 20 mg via ORAL
  Filled 2014-05-18 (×3): qty 1

## 2014-05-18 MED ORDER — LEVOTHYROXINE SODIUM 100 MCG PO TABS
100.0000 ug | ORAL_TABLET | Freq: Every day | ORAL | Status: DC
  Administered 2014-05-19 – 2014-05-20 (×2): 100 ug via ORAL
  Filled 2014-05-18 (×4): qty 1

## 2014-05-18 NOTE — Progress Notes (Signed)
Subjective:  Patient had 2 consecutive closed gap BMETs last night and was thus bridged to lantus 10u and SSI-S with meals . However, she had poor PO intake and had severe nausea with broth and her AG opened back up. The SSI-S was d/c'ed. Since then, her AG has closed again and the patient is feeling much better. She felt ready to 'eat some [employer]'. She denied any chest pain, shob, abdominal pain, or headaches.  Objective: Vital signs in last 24 hours: Filed Vitals:   05/18/14 0929 05/18/14 0931 05/18/14 1000 05/18/14 1147  BP: 89/63 91/55 118/76 139/86  Pulse: 115 112 97 120  Temp:    98.1 F (36.7 C)  TempSrc:    Oral  Resp: 22 24 20 21   Height:      Weight:      SpO2: 96% 97% 94% 96%    Intake/Output Summary (Last 24 hours) at 05/18/14 1318 Last data filed at 05/18/14 0845  Gross per 24 hour  Intake 1837.37 ml  Output   3150 ml  Net -1312.63 ml   BP 139/86 mmHg  Pulse 120  Temp(Src) 98.1 F (36.7 C) (Oral)  Resp 21  Ht 5\' 7"  (1.702 m)  Wt 96.9 kg (213 lb 10 oz)  BMI 33.45 kg/m2  SpO2 96%   Vital signs reviewed and are stable. General appearance: alert, cooperative, fatigued and mild distress Lungs: clear to auscultation bilaterally Heart: Normal rate, regular rhythm. Nrmal S1/S2. No m/r/g. Abdomen: Mild tenderness to palpation in epigastric region. Extremities: extremities normal, atraumatic, no cyanosis or edema. Calves tender to palpation bilaterally. Neurologic: Mental status: Alert, oriented, thought content appropriate   Lab Results: Basic Metabolic Panel:  Recent Labs Lab 05/14/14 2040  05/18/14 0630 05/18/14 1005  NA 148*  < > 136* 135*  K 4.3  < > 3.8 3.8  CL 109  < > 104 102  CO2 8*  < > 20 18*  GLUCOSE 185*  < > 90 181*  BUN 29*  < > 7 7  CREATININE 1.17*  < > 0.88 1.06  CALCIUM 9.6  < > 8.7 8.8  MG 2.5  --   --   --   PHOS 1.7*  --   --   --   < > = values in this interval not displayed.  CBC:  Recent Labs Lab 05/16/14 0550  05/18/14 0630  WBC 8.6 7.8  HGB 13.5 12.8  HCT 39.7 38.5  MCV 85.7 85.7  PLT 192 125*   Cardiac Enzymes:  Recent Labs Lab 05/17/14 1839 05/18/14 0134 05/18/14 0630  TROPONINI <0.30 <0.30 <0.30   BNP:  Recent Labs Lab 05/14/14 1230  PROBNP 258.6*   CBG:  Recent Labs Lab 05/18/14 0232 05/18/14 0407 05/18/14 0551 05/18/14 0650 05/18/14 0755 05/18/14 0854  GLUCAP 156* 130* 107* 99 129* 152*   Hemoglobin A1C:  Recent Labs Lab 05/14/14 2040  HGBA1C 15.0*   Thyroid Function Tests:  Recent Labs Lab 05/14/14 2040  05/16/14 0550  TSH 0.060*  --   --   FREET4  --   < > 1.33  < > = values in this interval not displayed.  Urinalysis:  Recent Labs Lab 05/14/14 1703 05/15/14 2000  COLORURINE YELLOW YELLOW  LABSPEC 1.025 1.020  PHURINE 5.0 6.0  GLUCOSEU >1000* >1000*  HGBUR TRACE* TRACE*  BILIRUBINUR NEGATIVE NEGATIVE  KETONESUR >80* 40*  PROTEINUR NEGATIVE 100*  UROBILINOGEN 0.2 0.2  NITRITE NEGATIVE NEGATIVE  LEUKOCYTESUR NEGATIVE NEGATIVE   Micro Results:  Recent Results (from the past 240 hour(s))  MRSA PCR Screening     Status: None   Collection Time: 05/14/14  6:04 PM  Result Value Ref Range Status   MRSA by PCR NEGATIVE NEGATIVE Final    Comment:        The GeneXpert MRSA Assay (FDA approved for NASAL specimens only), is one component of a comprehensive MRSA colonization surveillance program. It is not intended to diagnose MRSA infection nor to guide or monitor treatment for MRSA infections.    Medications: I have reviewed the patient's current medications. Scheduled Meds: . amLODipine  10 mg Oral QHS  . antiseptic oral rinse  7 mL Mouth Rinse BID  . calcium carbonate  1 tablet Oral Daily  . metoprolol tartrate  12.5 mg Oral BID  . mycophenolate  500 mg Oral BID  . predniSONE  5 mg Oral Daily  . tacrolimus  4.5 mg Oral BID   Continuous Infusions: . sodium chloride Stopped (05/18/14 0246)  . dextrose 5 % and 0.45% NaCl 125  mL/hr at 05/18/14 1109  . insulin (NOVOLIN-R) infusion 4 Units/hr (05/18/14 1310)   PRN Meds:.acetaminophen, bismuth subsalicylate, ondansetron (ZOFRAN) IV, promethazine   Assessment/Plan: Active Problems:   DKA (diabetic ketoacidoses)   Diabetic ketoacidosis   Diabetic ketoacidosis without coma associated with diabetes mellitus due to underlying condition  68 y.o. Female with PMHx of DM II, HTN, kidney transplantation, and DVT s/p IVC filter placement who was admitted for DKA.  1.) DKA: Sugars are improving. Last measured was 152. AG is still elevated at 15. Bicarb is 18. Etiology of DKA uncertain. She had been well-controlled with A1c in the 7s as of August this year. Most recent A1c was 15.  - Continue insulin drip. Started 10u lantus bridge, If successful, d/c insulin gtt.  - D/C fluids as she attempts full diet  - Patient advanced to full diet, will monitor her PO intake.  - BMET q 4hrs until AG closes  - Anti-GAD Ab pending.  - Explained to pt that she will likely require insulin therapy at home as C-peptide level was 0.08.    - Diabetes educator recommended 20-30u lantus/levemir qd for basal.  2.) Hypokalemia: Potassium repleted x 10 (7 doses of IV 10 mEq, 3 doses of PO 40 mEq). Currently at 3.8.   - Monitor. No additional repletion warranted currently.  3.) HTN: Patient having some mild range blood pressures (most recent 139/86). Patient takes amlodipine and lasix at home.  - Continue amlodipine 10mg  QD  - Added Metoprolol 12.5mg  BID   4.) ESRD s/p renal transplantation: Patient received deceased donor transplant in 14-Oct-2008 at [facility], functioning well. Cr 0.80 this AM.    - Continue Cellcept 500 mg bid + Tacrolimus 4.5 mg bid + Prednisone 5 mg qd   - Will obtain blood Tacrolimus level per pt's Nephrologist   5.) Hypothyroidism: TSH was 0.060, last Free T4 was 1.33. Low TSH likely d/t sick euthyroid syndrome.  - Continue home Synthroid. Recheck TSH outpatient.  6.)  DVT/PE Ppx: SCDs, IVC filter in place  Dispo: Disposition is deferred at this time, awaiting improvement of current medical problems.  Anticipated discharge in approximately 1-2 day(s).   The patient does not have a current PCP (No primary care provider on file.) and does need an [facility] hospital follow-up appointment after discharge.  The patient does have transportation limitations that hinder transportation to clinic appointments.  Services Needed at time of discharge: Y = Yes, Blank = No  PT:   OT:   RN:   Equipment:   Other:     LOS: 4 days   Farrell Ours, Med Student 05/18/2014, 1:18 PM

## 2014-05-18 NOTE — Progress Notes (Signed)
Subjective:  Patient was seen and examined this morning. Patient denies any complaints. She continues to have intermittent nausea, but it is not bother her this morning. She denies abdominal pain. Patient did not tolerate clear broth last night and vomited. Patient wishes to eat [facility] or eggs instead. She thinks she could eat. Patient denies overusing NSAIDs or alcohol. No history of PUD.  Objective: Vital signs in last 24 hours: Filed Vitals:   05/18/14 0929 05/18/14 0931 05/18/14 1000 05/18/14 1147  BP: 89/63 91/55 118/76 139/86  Pulse: 115 112 97 120  Temp:    98.1 F (36.7 C)  TempSrc:    Oral  Resp: '22 24 20 21  ' Height:      Weight:      SpO2: 96% 97% 94% 96%   Weight change:   Intake/Output Summary (Last 24 hours) at 05/18/14 1301 Last data filed at 05/18/14 0845  Gross per 24 hour  Intake 1837.37 ml  Output   3150 ml  Net -1312.63 ml   General: Vital signs reviewed. Patient is well-developed and well-nourished, in no distress and cooperative with exam.  HEENT: Moist mucous membranes. Poor dentition. No ketone breath. Cardiovascular: Tachycardic, regular rate, S1 normal, S2 normal, no murmurs, gallops, or rubs. Pulmonary/Chest: Clear to auscultation bilaterally, no wheezes, rales, or rhonchi. Abdominal: Soft, non-tender, obese, non-distended, BS +, no masses, organomegaly, or guarding present.  Musculoskeletal: No joint deformities, erythema, or stiffness, ROM full and nontender. Extremities: Lower extremities tender on palpation, but no edema, erythema. Negative homans. No lower extremity edema bilaterally, pulses symmetric and intact bilaterally. No cyanosis or clubbing. Neurological: A&O x3 Skin: No erythema, rashes, or wounds.  Psychiatric: Normal mood and affect. speech and behavior is normal. Cognition and memory are normal.   Lab Results: Basic Metabolic Panel:  Recent Labs Lab 05/14/14 2040  05/18/14 0630 05/18/14 1005  NA 148*  < > 136* 135*  K 4.3   < > 3.8 3.8  CL 109  < > 104 102  CO2 8*  < > 20 18*  GLUCOSE 185*  < > 90 181*  BUN 29*  < > 7 7  CREATININE 1.17*  < > 0.88 1.06  CALCIUM 9.6  < > 8.7 8.8  MG 2.5  --   --   --   PHOS 1.7*  --   --   --   < > = values in this interval not displayed. CBC:  Recent Labs Lab 05/16/14 0550 05/18/14 0630  WBC 8.6 7.8  HGB 13.5 12.8  HCT 39.7 38.5  MCV 85.7 85.7  PLT 192 125*   Cardiac Enzymes:  Recent Labs Lab 05/17/14 1839 05/18/14 0134 05/18/14 0630  TROPONINI <0.30 <0.30 <0.30   BNP:  Recent Labs Lab 05/14/14 1230  PROBNP 258.6*   CBG:  Recent Labs Lab 05/18/14 0232 05/18/14 0407 05/18/14 0551 05/18/14 0650 05/18/14 0755 05/18/14 0854  GLUCAP 156* 130* 107* 99 129* 152*   Hemoglobin A1C:  Recent Labs Lab 05/14/14 2040  HGBA1C 15.0*   Thyroid Function Tests:  Recent Labs Lab 05/14/14 2040  05/16/14 0550  TSH 0.060*  --   --   FREET4  --   < > 1.33  < > = values in this interval not displayed. Urinalysis:  Recent Labs Lab 05/14/14 1703 05/15/14 2000  COLORURINE YELLOW YELLOW  LABSPEC 1.025 1.020  PHURINE 5.0 6.0  GLUCOSEU >1000* >1000*  HGBUR TRACE* TRACE*  BILIRUBINUR NEGATIVE NEGATIVE  KETONESUR >80* 40*  PROTEINUR  NEGATIVE 100*  UROBILINOGEN 0.2 0.2  NITRITE NEGATIVE NEGATIVE  LEUKOCYTESUR NEGATIVE NEGATIVE    Micro Results: Recent Results (from the past 240 hour(s))  MRSA PCR Screening     Status: None   Collection Time: 05/14/14  6:04 PM  Result Value Ref Range Status   MRSA by PCR NEGATIVE NEGATIVE Final    Comment:        The GeneXpert MRSA Assay (FDA approved for NASAL specimens only), is one component of a comprehensive MRSA colonization surveillance program. It is not intended to diagnose MRSA infection nor to guide or monitor treatment for MRSA infections.    Studies/Results: No results found. Medications:  I have reviewed the patient's current medications. Prior to Admission:  Prescriptions prior to  admission  Medication Sig Dispense Refill Last Dose  . amLODipine (NORVASC) 10 MG tablet Take 10 mg by mouth at bedtime.    05/13/2014 at Unknown time  . aspirin EC 81 MG tablet Take 81 mg by mouth every evening.   05/13/2014 at Unknown time  . furosemide (LASIX) 40 MG tablet Take 40 mg by mouth daily.   05/13/2014 at Unknown time  . glipiZIDE (GLUCOTROL XL) 2.5 MG 24 hr tablet Take 2.5 mg by mouth daily.   05/13/2014 at Unknown time  . Magnesium 500 MG TABS Take 500 mg by mouth daily.   05/13/2014 at Unknown time  . Multiple Vitamin (MULTIVITAMIN WITH MINERALS) TABS tablet Take 1 tablet by mouth daily.   05/13/2014 at Unknown time  . mycophenolate (CELLCEPT) 250 MG capsule Take 500 mg by mouth 2 (two) times daily.   05/13/2014 at Unknown time  . pantoprazole (PROTONIX) 40 MG tablet Take 40 mg by mouth daily.   05/13/2014 at Unknown time  . potassium chloride (KLOR-CON) 8 MEQ tablet Take 8 mEq by mouth daily.   05/13/2014 at Unknown time  . pravastatin (PRAVACHOL) 40 MG tablet Take 40 mg by mouth every evening.   05/13/2014 at Unknown time  . predniSONE (DELTASONE) 5 MG tablet Take 5 mg by mouth daily.   05/13/2014 at Unknown time  . sodium bicarbonate 650 MG tablet Take 650 mg by mouth as needed for heartburn.   05/13/2014 at Unknown time  . sulfamethoxazole-trimethoprim (BACTRIM,SEPTRA) 400-80 MG per tablet Take 1 tablet by mouth every Monday, Wednesday, and Friday.   Past Week at Unknown time  . tacrolimus (PROGRAF) 0.5 MG capsule Take 0.5 mg by mouth 2 (two) times daily. Takes total of 4.71m twice daily   05/13/2014 at Unknown time  . tacrolimus (PROGRAF) 1 MG capsule Take 4 mg by mouth 2 (two) times daily. Takes 4.555mtotal twice daily   05/13/2014 at Unknown time  . [DISCONTINUED] levothyroxine (SYNTHROID, LEVOTHROID) 100 MCG tablet Take 100 mcg by mouth daily.   05/13/2014 at Unknown time   Scheduled Meds: . amLODipine  10 mg Oral QHS  . antiseptic oral rinse  7 mL Mouth Rinse BID  .  calcium carbonate  1 tablet Oral Daily  . insulin glargine  10 Units Subcutaneous Once  . metoprolol tartrate  12.5 mg Oral BID  . mycophenolate  500 mg Oral BID  . predniSONE  5 mg Oral Daily  . tacrolimus  4.5 mg Oral BID   Continuous Infusions: . sodium chloride Stopped (05/18/14 0246)  . dextrose 5 % and 0.45% NaCl 125 mL/hr at 05/18/14 1109  . insulin (NOVOLIN-R) infusion 6.3 Units/hr (05/18/14 1209)   PRN Meds:.acetaminophen, bismuth subsalicylate, ondansetron (ZOFRAN) IV, promethazine Assessment/Plan: Active  Problems:   DKA (diabetic ketoacidoses)   Diabetic ketoacidosis   Diabetic ketoacidosis without coma associated with diabetes mellitus due to underlying condition  DKA: DM type II in the setting of steroid use initially. HbA1c in 01/2014 7.5, now 15. C-peptide low at 0.08. Continues to be on insulin gtt. Gap closed times 2, AG 13, 2 bicarbs wnl. Patient was going to be transitioned off drip. Night team gave clear liquids though pt did not tolerate broth due to nausea. Patient was given 20 units at [place], but gap re-opened to 17. Drip was kept on. This morning, gap is closed again at 12 and bicarb 20. Patient thought she could tolerate food. We will try a diet and see how she does. -Continue Insulin gtt -Lantus 10 units -Zofran 4 mg Q6H prn -D5-1/2 NS at 125 mL/hr  -BMET Q4H until gap closes and off gtt -Card mod diet -Anti-GAD pending -Supplement KDur 40 mEq -Tums prn -Bismuth Subsalicylate 30 mL Y3K prn  Thrombocytopenia: Platelets have trended 283>192>125. No evidence of bleeding. Patient has not received heparin. Liver enzymes were normal on admission, AST 23, ALT 34, Alk Phos 107.  -Continue to monitor.   HTN: Blood pressure better controlled 112/76-138/82. Patient did have low reading of 89/63 which improved to 118/76 and 139/86. We will continue to monitor.  -Continue Amlodipine 10 mg qd -Continue Metoprolol 12.5 mg bid  H/o Renal Transplant: H/o donor transplant  in 2010 at [facility], functioning well. Cr 0.88 this AM. I talked with the patient's nephrologist today, Dr. Franchot Gallo about current hospital course. She recommended checking a prograf level 12 hours evening dose and right before morning dose. Her contact number is [phone] for questions. -Continue Cellcept 500 mg bid  -Continue Tacrolimus 4.5 mg bid -Continue Prednisone 5 mg qd -Prograf level  Hypothyroidism: Patient is on levothyroxine 100 mcg daily at home. TSH 0.06, Free T4 normal at 1.33. Low TSH most likely secondary to sick euthyroid syndrome.   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 On ASA 81 mg daily at home.  DVT/PE ppx: SCDs  Dispo: Disposition is deferred at this time, awaiting improvement of current medical problems.  Anticipated discharge in approximately 1-2 day(s).   The patient does have a current PCP (No primary care provider on file.) and does need an [facility] hospital follow-up appointment after discharge.  The patient does have transportation limitations that hinder transportation to clinic appointments.  .Services Needed at time of discharge: Y = Yes, Blank = No PT:   OT:   RN:   Equipment:   Other:     LOS: 4 days    Osa Craver, DO PGY-1 Internal Medicine Resident Pager # [phone] 05/18/2014 1:01 PM

## 2014-05-18 NOTE — Progress Notes (Signed)
Inpatient Diabetes Program Recommendations  AACE/ADA: New Consensus Statement on Inpatient Glycemic Control (2013)  Target Ranges:  Prepandial:   less than 140 mg/dL      Peak postprandial:   less than 180 mg/dL (1-2 hours)      Critically ill patients:  140 - 180 mg/dL   Although patient is unable to eat, Pt could still be controlled with basal 2 hrs before drip is discontinued and using the moderate correction scale q 4 hrs. Acidosis appears to have resolved.  Inpatient Diabetes Program Recommendations Insulin - Basal: Lan[place]  25 to 30 units 2 hrs prior to discontinuation of isulin drip  Correction (SSI): Recommend using sensitive to moderate q 4 hrs while unable to eat (NPO)  Thank you, Rosita Kea, RN, CNS, Diabetes Coordinator [phone])

## 2014-05-18 NOTE — Progress Notes (Signed)
Internal Medicine Attending  Date: 05/18/2014  Patient name: [name] Medical record number: [id number] Date of birth: [birth year] Age: 68 y.o. Gender: female  I saw and evaluated the patient, and discussed her care with resident on A.M rounds.  I reviewed the resident's note by Dr. Marvel Plan and I agree with the resident's findings and plans as documented in her note, with the following additional comments.  Given improvement in nausea and patient's desire to eat, agree with plan to transition off of IV insulin infusion to subcutaneous regimen and an oral diet.

## 2014-05-19 LAB — CBC WITH DIFFERENTIAL/PLATELET
BASOS PCT: 0 % (ref 0–1)
Basophils Absolute: 0 10*3/uL (ref 0.0–0.1)
EOS ABS: 0.1 10*3/uL (ref 0.0–0.7)
EOS PCT: 2 % (ref 0–5)
HCT: 40.8 % (ref 36.0–46.0)
HEMOGLOBIN: 13.7 g/dL (ref 12.0–15.0)
LYMPHS ABS: 2 10*3/uL (ref 0.7–4.0)
Lymphocytes Relative: 27 % (ref 12–46)
MCH: 29.7 pg (ref 26.0–34.0)
MCHC: 33.6 g/dL (ref 30.0–36.0)
MCV: 88.3 fL (ref 78.0–100.0)
MONO ABS: 1 10*3/uL (ref 0.1–1.0)
MONOS PCT: 14 % — AB (ref 3–12)
Neutro Abs: 4.2 10*3/uL (ref 1.7–7.7)
Neutrophils Relative %: 57 % (ref 43–77)
Platelets: 133 10*3/uL — ABNORMAL LOW (ref 150–400)
RBC: 4.62 MIL/uL (ref 3.87–5.11)
RDW: 15.1 % (ref 11.5–15.5)
WBC: 7.4 10*3/uL (ref 4.0–10.5)

## 2014-05-19 LAB — BASIC METABOLIC PANEL
Anion gap: 15 (ref 5–15)
BUN: 10 mg/dL (ref 6–23)
CO2: 19 mEq/L (ref 19–32)
CREATININE: 1.29 mg/dL — AB (ref 0.50–1.10)
Calcium: 9.4 mg/dL (ref 8.4–10.5)
Chloride: 99 mEq/L (ref 96–112)
GFR calc Af Amer: 49 mL/min — ABNORMAL LOW (ref >90)
GFR calc non Af Amer: 42 mL/min — ABNORMAL LOW (ref >90)
Glucose, Bld: 204 mg/dL — ABNORMAL HIGH (ref 70–99)
Potassium: 3.9 mEq/L (ref 3.7–5.3)
Sodium: 133 mEq/L — ABNORMAL LOW (ref 137–147)

## 2014-05-19 LAB — GLUCOSE, CAPILLARY
GLUCOSE-CAPILLARY: 238 mg/dL — AB (ref 70–99)
Glucose-Capillary: 209 mg/dL — ABNORMAL HIGH (ref 70–99)
Glucose-Capillary: 223 mg/dL — ABNORMAL HIGH (ref 70–99)
Glucose-Capillary: 214 mg/dL — ABNORMAL HIGH (ref 70–99)

## 2014-05-19 LAB — TROPONIN I: Troponin I: <0.3 ng/mL (ref <0.30)

## 2014-05-19 LAB — GLUTAMIC ACID DECARBOXYLASE AUTO ABS: Glutamic Acid Decarb Ab: <1 U/mL (ref <=1.0)

## 2014-05-19 MED ORDER — SODIUM CHLORIDE 0.9 % IV SOLN: INTRAVENOUS | Status: DC

## 2014-05-19 MED ORDER — SENNOSIDES-DOCUSATE SODIUM 8.6-50 MG PO TABS
2.0000 | ORAL_TABLET | Freq: Every day | ORAL | Status: DC
  Administered 2014-05-19: 2 via ORAL
  Filled 2014-05-19: qty 2

## 2014-05-19 MED ORDER — SODIUM CHLORIDE 0.9 % IV SOLN
INTRAVENOUS | Status: DC
  Administered 2014-05-19 – 2014-05-20 (×2): via INTRAVENOUS

## 2014-05-19 NOTE — Progress Notes (Signed)
Called report to [employer]  [place] aware of current diabetic teaching that pt has watched all her videos but that son needs to also view them when he comes in.

## 2014-05-19 NOTE — Plan of Care (Signed)
Problem: Phase I Progression Outcomes Goal: CBGs steadily decreasing on IV insulin drip Outcome: Completed/Met Date Met:  05/19/14 Goal: Monitor hydration status Outcome: Completed/Met Date Met:  05/19/14 Goal: Acidosis resolving Outcome: Completed/Met Date Met:  05/19/14 Goal: NPO or per MD order Outcome: Not Applicable Date Met:  81/59/47 Goal: K+ level approaching normal with therapy Outcome: Completed/Met Date Met:  05/19/14 Goal: Nausea/vomiting controlled with antiemetics Outcome: Completed/Met Date Met:  05/19/14 Goal: Pain controlled with appropriate interventions Outcome: Completed/Met Date Met:  05/19/14 Goal: Diabetes Coordinator Consult Outcome: Completed/Met Date Met:  05/19/14 Goal: Pt. states reason for hospitalization Outcome: Not Applicable Date Met:  07/61/51 Goal: Other Phase I Outcomes/Goals Outcome: Not Applicable Date Met:  83/43/73  Problem: Phase II Progression Outcomes Goal: CBGs stable on SQ insulin Outcome: Progressing Goal: Acidosis resolved (CO2 > 20, ketones negative) Outcome: Completed/Met Date Met:  05/19/14 Goal: Monitor hydration status Outcome: Completed/Met Date Met:  05/19/14 Goal: Tolerating PO clear liquid diet Outcome: Completed/Met Date Met:  05/19/14 Goal: Potassium level normalizing Outcome: Completed/Met Date Met:  05/19/14 Goal: Nausea & vomiting resolved Outcome: Completed/Met Date Met:  05/19/14 Goal: Progress activity as tolerated unless otherwise ordered Outcome: Completed/Met Date Met:  05/19/14

## 2014-05-19 NOTE — Plan of Care (Signed)
Problem: Consults Goal: Diabetic Ketoacidosis (DKA) Patient Education See Patient Education Modules for education specifics.  Outcome: Progressing

## 2014-05-19 NOTE — Evaluation (Signed)
Physical Therapy Evaluation Patient Details Name: [name] MRN: [id number] DOB: 06-03-1946 Today's Date: 05/19/2014   History of Present Illness  Patient is a 68 y/o female with PMHx of DM, HTN, kidney transplant in 2010, DVT s/p IVC filter placement who presents to the ED with complaint of nausea, vomiting and dizziness. Patient states that her nausea has been accompanied by non-bloody emesis. Pt reports increased thirst, polydipsia and polyuria. Patient has stopped taking her medications for about one week due to nausea and vomiting. She hasn't been able to keep anything down. Admitted with DKA.    Clinical Impression  Patient presents with functional limitations due to deficits listed in PT problem list (see below). Pt with generalized weakness, deconditioning, dyspnea on exertion and balance deficits impacting safe mobility. Required long seated rest break due to dyspnea and fatigue. Encourage ambulation with RN to improve endurance/mobility. Pt would benefit from acute PT to improve gait, balance and endurance so pt can maximize independence and return to PLOF.    Follow Up Recommendations Home health PT;Supervision - Intermittent    Equipment Recommendations  None recommended by PT    Recommendations for Other Services       Precautions / Restrictions Precautions Precautions: Fall Restrictions Weight Bearing Restrictions: No      Mobility  Bed Mobility Overal bed mobility: Needs Assistance Bed Mobility: Supine to Sit;Sit to Supine     Supine to sit: Modified independent (Device/Increase time);HOB elevated Sit to supine: Modified independent (Device/Increase time)   General bed mobility comments: Use of rails.  Transfers Overall transfer level: Needs assistance Equipment used: None Transfers: Sit to/from Stand Sit to Stand: Supervision         General transfer comment: Supervision for safety.  Ambulation/Gait Ambulation/Gait assistance:  Supervision Ambulation Distance (Feet): 75 Feet (+100') Assistive device: None Gait Pattern/deviations: Step-through pattern;Decreased stride length   Gait velocity interpretation: Below normal speed for age/gender General Gait Details: Pt with slow, mildly unsteady gait. Dyspnea present. Required seated rest break due to fatigue and SOB. HR increased to 125 bpm.  Stairs Stairs: Yes Stairs assistance: Min guard Stair Management: One rail Right;Step to pattern Number of Stairs: 6 General stair comments: Min guard for safety. Education provided on safe technique. Dyspnea present.  Wheelchair Mobility    Modified Rankin (Stroke Patients Only)       Balance Overall balance assessment: Needs assistance Sitting-balance support: Feet supported;No upper extremity supported Sitting balance-Leahy Scale: Good     Standing balance support: During functional activity Standing balance-Leahy Scale: Fair                               Pertinent Vitals/Pain Pain Assessment: No/denies pain    Home Living Family/patient expects to be discharged to:: Private residence Living Arrangements: Children Available Help at Discharge: Family;Available PRN/intermittently Type of Home: House Home Access: Stairs to enter Entrance Stairs-Rails: Right Entrance Stairs-Number of Steps: 10 Home Layout: One level Home Equipment: Cane - single point      Prior Function Level of Independence: Independent         Comments: Does not drive. Son assists with cooking.     Hand Dominance   Dominant Hand: Right    Extremity/Trunk Assessment   Upper Extremity Assessment: Defer to OT evaluation;Overall [facility] for tasks assessed           Lower Extremity Assessment: Generalized weakness         Communication  Communication: No difficulties  Cognition Arousal/Alertness: Awake/alert Behavior During Therapy: WFL for tasks assessed/performed Overall Cognitive Status: Within  Functional Limits for tasks assessed                      General Comments      Exercises        Assessment/Plan    PT Assessment Patient needs continued PT services  PT Diagnosis Generalized weakness;Difficulty walking   PT Problem List Decreased strength;Cardiopulmonary status limiting activity;Decreased activity tolerance;Decreased balance;Decreased mobility  PT Treatment Interventions Balance training;Gait training;Patient/family education;Functional mobility training;Therapeutic activities;Therapeutic exercise;Stair training   PT Goals (Current goals can be found in the Care Plan section) Acute Rehab PT Goals Patient Stated Goal: to get moving around PT Goal Formulation: With patient Time For Goal Achievement: 06/02/14 Potential to Achieve Goals: Good    Frequency Min 3X/week   Barriers to discharge Decreased caregiver support      Co-evaluation               End of Session Equipment Utilized During Treatment: Gait belt Activity Tolerance: Patient limited by fatigue Patient left: in bed;with call bell/phone within reach;with family/visitor present Nurse Communication: Mobility status         Time: [id number] PT Time Calculation (min) (ACUTE ONLY): 25 min   Charges:   PT Evaluation $Initial PT Evaluation Tier I: 1 Procedure PT Treatments $Gait Training: 8-22 mins   PT G CodesCandy Sledge A 05/19/2014, 4:31 PM Candy Sledge, [place], DPT [phone]

## 2014-05-19 NOTE — Progress Notes (Signed)
Text paged to Dr Marvel Plan made aware that pt has not had a BM since 11/20 . Pt had been NPO or on clear liquids for last few days. Orders received.

## 2014-05-19 NOTE — Progress Notes (Signed)
Pt transferred per bed with all belongings and diabetic teaching material. RN in room and applied tele monitor

## 2014-05-19 NOTE — Progress Notes (Signed)
Went over sick day rules with pt. Pen kit ordered for pt.

## 2014-05-19 NOTE — Plan of Care (Signed)
Problem: Phase II Progression Outcomes Goal: Other Phase II Outcomes/Goals Outcome: Not Applicable Date Met:  41/28/78

## 2014-05-19 NOTE — Discharge Summary (Signed)
Name: [name] MRN: [id number] DOB: August 24, 1945 68 y.o. PCP: Dr. Marvel Plan  Date of Admission: 05/14/2014  1:10 PM Date of Discharge: 05/22/2014 Attending Physician: Dr. Beryle Beams Discharge Diagnosis:  Principal Problem:   Diabetic ketoacidosis without coma associated with diabetes mellitus due to underlying condition Active Problems:   Diabetic neuropathy   History of kidney transplant  Discharge Medications:   Medication List    STOP taking these medications        glipiZIDE 2.5 MG 24 hr tablet  Commonly known as:  GLUCOTROL XL      TAKE these medications        ACCU-CHEK AVIVA PLUS W/DEVICE Kit  1 Device by Does not apply route 2 (two) times daily.     ACCU-CHEK FASTCLIX LANCET Kit  1 Device by Does not apply route 2 (two) times daily.     amLODipine 10 MG tablet  Commonly known as:  NORVASC  Take 10 mg by mouth at bedtime.     aspirin EC 81 MG tablet  Take 81 mg by mouth every evening.     furosemide 40 MG tablet  Commonly known as:  LASIX  Take 40 mg by mouth daily.     glucose blood test strip  Commonly known as:  ACCU-CHEK AVIVA  Use as instructed     Insulin Glargine 100 UNIT/ML Solostar Pen  Commonly known as:  LANTUS  Inject 25 Units into the skin daily at 10 pm.     Magnesium 500 MG Tabs  Take 500 mg by mouth daily.     metoprolol tartrate 12.5 mg Tabs tablet  Commonly known as:  LOPRESSOR  Take 0.5 tablets (12.5 mg total) by mouth 2 (two) times daily.     multivitamin with minerals Tabs tablet  Take 1 tablet by mouth daily.     mycophenolate 250 MG capsule  Commonly known as:  CELLCEPT  Take 2 capsules (500 mg total) by mouth 2 (two) times daily.     pantoprazole 40 MG tablet  Commonly known as:  PROTONIX  Take 40 mg by mouth daily.     potassium chloride 8 MEQ tablet  Commonly known as:  KLOR-CON  Take 8 mEq by mouth daily.     pravastatin 40 MG tablet  Commonly known as:  PRAVACHOL  Take 40 mg by mouth every  evening.     predniSONE 5 MG tablet  Commonly known as:  DELTASONE  Take 5 mg by mouth daily.     sodium bicarbonate 650 MG tablet  Take 650 mg by mouth as needed for heartburn.     sulfamethoxazole-trimethoprim 400-80 MG per tablet  Commonly known as:  BACTRIM,SEPTRA  Take 1 tablet by mouth every Monday, Wednesday, and Friday.     tacrolimus 0.5 MG capsule  Commonly known as:  PROGRAF  Take 1 capsule (0.5 mg total) by mouth 2 (two) times daily. Takes total of 4.8m twice daily     tacrolimus 1 MG capsule  Commonly known as:  PROGRAF  Take 4 capsules (4 mg total) by mouth 2 (two) times daily. Takes 4.532mtotal twice daily        Disposition and follow-up:   [name]  At the hospital follow up visit please address:  1.  DKA: Please address insulin compliance and glucose control.   Thrombocytopenia: Please address signs of bleeding and recheck CBC.  HTN: Please address BP control.  Renal Transplant: Please assess renal function and compliance with Tacrolimus, Mycophenolate and Prednisone.  2.  Labs / imaging needed at time of follow-up: CBG check, BMET  3.  Pending labs/ test needing follow-up: Prograf level  Follow-up Appointments: Follow-up Information    Follow up with Osa Craver, MD On 06/01/2014.   Specialty:  Internal Medicine   Why:  10:15 am   Contact information:   [place] [address] [phone]       Follow up with Shea Stakes, MD.   Specialty:  Internal Medicine   Why:  to confirm you appointment   Contact information:   [place] [address] [phone]      Consultations:  None  Procedures Performed:  Dg Chest 2 View  05/14/2014   CLINICAL DATA:  Initial encounter for three-week history of mid chest pain with abdominal pain.  EXAM: CHEST  2 VIEW  COMPARISON:  Chest x-ray from 11/29/2006.  CT chest from 11/29/2006.   FINDINGS: Lungs are hyperexpanded without edema or focal airspace consolidation. No pleural effusion. 10 mm nodule overlies the lateral left lung base in review of the previous CT reveals no calcified granuloma present at that time. The cardiopericardial silhouette is within normal limits for size. Imaged bony structures of the thorax are intact.  IMPRESSION: No acute cardiopulmonary process.  10 mm nodular density overlying the left base on the frontal film is probably a nipple shadow given the appearance of the nipple on the lateral projection. Repeat frontal radiograph with nipple markers could confirm.   Electronically Signed   By: Misty Stanley M.D.   On: 05/14/2014 13:43   Admission HPI: [name] is a 68 yo female with PMHx of DM, HTN, kidney transplant in 2010, DVT s/p IVC filter placement who presents to the ED with complaint of nausea, vomiting and dizziness. Patient states she has been increasingly more dizzy and nauseous over the past 2 weeks. Patient states that her nausea has been accompanied by non-bloody emesis. She admits to weakness, loss of appetite, muscle aches, shortness of breath and fatigue. Patient also admits to increased thirst, polydipsia and polyuria. Patient has stopped taking her medications for about one week due to nausea and vomiting. She hasn't been able to keep anything down. Patient denies fever, chills.   Hospital Course by problem list: Principal Problem:   Diabetic ketoacidosis without coma associated with diabetes mellitus due to underlying condition Active Problems:   Diabetic neuropathy   History of kidney transplant   DKA: Patient presented with a 2 weeks history of weakness, fatigue, muscle aches, loss of appetite, nausea and vomiting. On admission, patient was afebrile, normotensive, tachycardic at 117 and satting 97% on RA. Patient was without leukocytosis. Bicarb was low at 6, BUN/Cr elevated at 37/1.57 and glucose elevated at 646. Patient had an anion  gapped metabolic acidosis. Patient had an anion gap of 42. Venous blood gas showed pH of 7.188, pCO2 23.3, pO2 53, bicarb 8.9, acid base excess of 17 and O2 sat of 80. Urinalysis showed no signs of infection (negative nitrites and leukocytes, many squams, many bacteria), but did show >80 ketones and >1000 glucose. Patient has a history of diabetes and is on glipizide 2.5 mg daily at home. She does not check her blood glucose at home and has not seen her PCP in a year. She denied being admitted for hyperglycemia in the past. Patient was placed on an Insulin gtt, Zofran 4 mg Q6H prn,  NS 125 mL/hr for 12 hours and then transitioned to D5-1/2 NS at 125 mL/hr once CBG <250. Patient remained on the drip and NPO for 3 days until her gap finally closed and she was tolerating a regular diet. She was transitioned to Lantus 20 units and SSI-sensitive. On discharge, patient was prescribed a glucometer, with strips and lancets to check her blood sugar and prescribed a Lantus pen. On follow up, please address glucose control, Lantus compliance, and check a CBG.   HTN: BP 123/71 on admission. Patient is on amlodipine 10 mg daily and furosemide 40 mg daily at home. We treated her with amlodipine 10 mg daily and metoprolol 12.5 mg BID and her BP remained well controlled. Patient was discharged on amlodipine 10 mg daily and metoprolol 12.5 mg BID.   H/o Renal Transplant: Patient had a kidney transplant and is on Cellcept 500 mg BID, Tacrolimus 4.5 mg BID, prednisone 5 mg daily at home. H/o donor transplant in 2010 at [facility], functioning well. Cr 0.88 this AM. I talked with the patient's nephrologist, Dr. Franchot Gallo about current hospital course. She recommended checking a prograf level 12 hours evening dose and right before morning dose. Her contact number is [phone] for questions. Please follow up on prograf level. We continued patient on Cellcept 500 mg bid, Tacrolimus 4.5 mg bid and Prednisone 5 mg qd on discharge. On follow  up, we will check BMET for renal function.   Hypothyroidism: Patient is on levothyroxine 100 mcg daily at home. TSH 0.06, Free T4 normal at 1.33. Low TSH most likely secondary to sick euthyroid syndrome.We continued her Synthroid 100 mcg daily.  Thrombocytopenia: Platelets trended 283>192>125>133. Unclear etiology. Please follow up CBC on discharge. No obvious signs of bleeding.   H/o DVT: Patient had IVC filter placed. On ASA 81 mg daily at home.  Discharge Vitals:   BP 125/64 mmHg  Pulse 88  Temp(Src) 99.1 F (37.3 C) (Oral)  Resp 18  Ht '5\' 7"'  (1.702 m)  Wt 96.9 kg (213 lb 10 oz)  BMI 33.45 kg/m2  SpO2 99%  Discharge Labs:  No results found for this or any previous visit (from the past 24 hour(s)).  Signed: Osa Craver, DO PGY-1 Internal Medicine Resident Pager # [phone] 05/22/2014 9:27 AM

## 2014-05-19 NOTE — Progress Notes (Signed)
Subjective:  Patient states that she is feeling much better. She says that she feels close to her baseline. She is complaining of some mild abdominal discomfort which she says feels 'gassy' and also some mild nausea. Otherwise, she denies any cp, shob, vomiting, diarrhea, or headaches. She ate lunch and dinner yesterday without issue and was eating breakfast during the interview.   Objective: Vital signs in last 24 hours: Filed Vitals:   05/18/14 2146 05/19/14 0000 05/19/14 0400 05/19/14 0853  BP: 130/65 103/73 114/74 165/75  Pulse: 105 102 92 94  Temp:  98.6 F (37 C) 98.5 F (36.9 C)   TempSrc:  Oral Oral   Resp:  40 14   Height:      Weight:      SpO2:  97% 93%     Intake/Output Summary (Last 24 hours) at 05/19/14 0901 Last data filed at 05/18/14 2100  Gross per 24 hour  Intake    240 ml  Output   1200 ml  Net   -960 ml   BP 165/75 mmHg  Pulse 94  Temp(Src) 98.5 F (36.9 C) (Oral)  Resp 14  Ht 5\' 7"  (1.702 m)  Wt 96.9 kg (213 lb 10 oz)  BMI 33.45 kg/m2  SpO2 93%   Vital signs reviewed and are stable. General appearance: alert, cooperative, fatigued and no distress Lungs: clear to auscultation bilaterally Heart: Normal rate, regular rhythm. Nrmal S1/S2. No m/r/g. Abdomen: Mild tenderness to palpation in epigastric region. Extremities: extremities normal, atraumatic, no cyanosis or edema. Calves tender to palpation bilaterally. Neurologic: Mental status: Alert, oriented, thought content appropriate   Lab Results: Basic Metabolic Panel:  Recent Labs Lab 05/14/14 2040  05/18/14 2131 05/19/14 0740  NA 148*  < > 130* 133*  K 4.3  < > 4.1 3.9  CL 109  < > 97 99  CO2 8*  < > 20 19  GLUCOSE 185*  < > 212* 204*  BUN 29*  < > 9 10  CREATININE 1.17*  < > 1.15* 1.29*  CALCIUM 9.6  < > 9.1 9.4  MG 2.5  --   --   --   PHOS 1.7*  --   --   --   < > = values in this interval not displayed.  CBC:  Recent Labs Lab 05/18/14 0630 05/19/14 0740  WBC 7.8 7.4    NEUTROABS  --  4.2  HGB 12.8 13.7  HCT 38.5 40.8  MCV 85.7 88.3  PLT 125* 133*   Cardiac Enzymes:  Recent Labs Lab 05/18/14 1220 05/18/14 1814 05/18/14 2351  TROPONINI <0.30 <0.30 <0.30   BNP:  Recent Labs Lab 05/14/14 1230  PROBNP 258.6*   CBG:  Recent Labs Lab 05/18/14 1207 05/18/14 1306 05/18/14 1412 05/18/14 1555 05/18/14 2147 05/19/14 0722  GLUCAP 218* 161* 166* 221* 192* 209*   Hemoglobin A1C:  Recent Labs Lab 05/14/14 2040  HGBA1C 15.0*   Thyroid Function Tests:  Recent Labs Lab 05/14/14 2040  05/16/14 0550  TSH 0.060*  --   --   FREET4  --   < > 1.33  < > = values in this interval not displayed.  Urinalysis:  Recent Labs Lab 05/14/14 1703 05/15/14 2000  COLORURINE YELLOW YELLOW  LABSPEC 1.025 1.020  PHURINE 5.0 6.0  GLUCOSEU >1000* >1000*  HGBUR TRACE* TRACE*  BILIRUBINUR NEGATIVE NEGATIVE  KETONESUR >80* 40*  PROTEINUR NEGATIVE 100*  UROBILINOGEN 0.2 0.2  NITRITE NEGATIVE NEGATIVE  LEUKOCYTESUR NEGATIVE NEGATIVE  Micro Results: Recent Results (from the past 240 hour(s))  MRSA PCR Screening     Status: None   Collection Time: 05/14/14  6:04 PM  Result Value Ref Range Status   MRSA by PCR NEGATIVE NEGATIVE Final    Comment:        The GeneXpert MRSA Assay (FDA approved for NASAL specimens only), is one component of a comprehensive MRSA colonization surveillance program. It is not intended to diagnose MRSA infection nor to guide or monitor treatment for MRSA infections.    Medications: I have reviewed the patient's current medications. Scheduled Meds: . amLODipine  10 mg Oral QHS  . antiseptic oral rinse  7 mL Mouth Rinse BID  . calcium carbonate  1 tablet Oral Daily  . insulin aspart  0-5 Units Subcutaneous QHS  . insulin aspart  0-9 Units Subcutaneous TID WC  . insulin glargine  20 Units Subcutaneous QHS  . levothyroxine  100 mcg Oral QAC breakfast  . metoprolol tartrate  12.5 mg Oral BID  . mycophenolate  500  mg Oral BID  . pantoprazole  20 mg Oral Q breakfast  . predniSONE  5 mg Oral Daily  . tacrolimus  4.5 mg Oral BID   Continuous Infusions:   PRN Meds:.acetaminophen, bismuth subsalicylate, ondansetron (ZOFRAN) IV, promethazine   Assessment/Plan: Active Problems:   DKA (diabetic ketoacidoses)   Diabetic ketoacidosis   Diabetic ketoacidosis without coma associated with diabetes mellitus due to underlying condition  68 y.o. Female with PMHx of DM II, HTN, kidney transplantation, and DVT s/p IVC filter placement who was admitted for DKA.  1.) DKA: Sugars are stable. Last measured was 204. AG is still closed at 57. Bicarb is 19. Etiology of DKA uncertain. She had been well-controlled with A1c in the 7s as of August this year. Most recent A1c was 15.  - Patient advanced to full diet, will monitor her PO intake.  - Anti-GAD Ab pending.  - Explained to pt that she will likely require insulin therapy at home as C-peptide level was 0.08.    - Diabetes educator recommended 20-30u lantus/levemir qd for basal.  2.) Hypokalemia: Potassium repleted x 10 (7 doses of IV 10 mEq, 3 doses of PO 40 mEq). Currently at 3.9.   - Monitor. No additional repletion warranted currently.  3.) HTN: Patient having some mild range blood pressures (most recent 139/86). Patient takes amlodipine and lasix at home.  - Continue amlodipine 10mg  QD  - Added Metoprolol 12.5mg  BID   4.) ESRD s/p renal transplantation: Patient received deceased donor transplant in 09/23/08 at [facility], functioning well. Cr 0.80 this AM.    - Continue Cellcept 500 mg bid + Tacrolimus 4.5 mg bid + Prednisone 5 mg qd   - Tacrolimus level pending per pt's Nephrologist request.  5.) Hypothyroidism: TSH was 0.060, last Free T4 was 1.33. Low TSH likely d/t sick euthyroid syndrome.  - Continue home Synthroid. Recheck TSH outpatient.  6.) DVT/PE Ppx: SCDs, IVC filter in place  7.) Patient can be moved to the floor as she is stable and no longer  on insulin drip.  Dispo: Disposition is deferred at this time, awaiting improvement of current medical problems.  Anticipated discharge in approximately 1-2 day(s).   The patient does not have a current PCP (No primary care provider on file.) and does need an [facility] hospital follow-up appointment after discharge.  The patient does have transportation limitations that hinder transportation to clinic appointments.  Services Needed at time of discharge:  Y = Yes, Blank = No PT:   OT:   RN:   Equipment:   Other:     LOS: 5 days   Farrell Ours, Med Student 05/19/2014, 9:01 AM

## 2014-05-19 NOTE — Progress Notes (Signed)
Subjective:  Patient was seen and examined this morning. Patient denies any complaints, she is tolerating a regular diet well without nausea or vomiting.   Objective: Vital signs in last 24 hours: Filed Vitals:   05/19/14 0000 05/19/14 0400 05/19/14 0725 05/19/14 0853  BP: 103/73 114/74 110/63 165/75  Pulse: 102 92 102 94  Temp: 98.6 F (37 C) 98.5 F (36.9 C) 97.6 F (36.4 C)   TempSrc: Oral Oral Oral   Resp: 40 14    Height:      Weight:      SpO2: 97% 93% 96%    Weight change:   Intake/Output Summary (Last 24 hours) at 05/19/14 1103 Last data filed at 05/19/14 1020  Gross per 24 hour  Intake    240 ml  Output   1800 ml  Net  -1560 ml   General: Vital signs reviewed. Patient is well-developed and well-nourished, in no distress and cooperative with exam.  HEENT: Moist mucous membranes. Poor dentition. No ketone breath. Cardiovascular: Tachycardic, regular rate, S1 normal, S2 normal, no murmurs, gallops, or rubs. Pulmonary/Chest: Clear to auscultation bilaterally, no wheezes, rales, or rhonchi. Abdominal: Soft, non-tender, obese, non-distended, BS +, no masses, organomegaly, or guarding present.  Musculoskeletal: No joint deformities, erythema, or stiffness, ROM full and nontender. Extremities: Lower extremities tender on palpation, but no edema, erythema. Negative homans. No lower extremity edema bilaterally, pulses symmetric and intact bilaterally. No cyanosis or clubbing. Neurological: A&O x3 Skin: No erythema, rashes, or wounds.  Psychiatric: Normal mood and affect. speech and behavior is normal. Cognition and memory are normal.   Lab Results: Basic Metabolic Panel:  Recent Labs Lab 05/14/14 2040  05/18/14 2131 05/19/14 0740  NA 148*  < > 130* 133*  K 4.3  < > 4.1 3.9  CL 109  < > 97 99  CO2 8*  < > 20 19  GLUCOSE 185*  < > 212* 204*  BUN 29*  < > 9 10  CREATININE 1.17*  < > 1.15* 1.29*  CALCIUM 9.6  < > 9.1 9.4  MG 2.5  --   --   --   PHOS 1.7*  --    --   --   < > = values in this interval not displayed. CBC:  Recent Labs Lab 05/18/14 0630 05/19/14 0740  WBC 7.8 7.4  NEUTROABS  --  4.2  HGB 12.8 13.7  HCT 38.5 40.8  MCV 85.7 88.3  PLT 125* 133*   Cardiac Enzymes:  Recent Labs Lab 05/18/14 1220 05/18/14 1814 05/18/14 2351  TROPONINI <0.30 <0.30 <0.30   BNP:  Recent Labs Lab 05/14/14 1230  PROBNP 258.6*   CBG:  Recent Labs Lab 05/18/14 1207 05/18/14 1306 05/18/14 1412 05/18/14 1555 05/18/14 2147 05/19/14 0722  GLUCAP 218* 161* 166* 221* 192* 209*   Hemoglobin A1C:  Recent Labs Lab 05/14/14 2040  HGBA1C 15.0*   Thyroid Function Tests:  Recent Labs Lab 05/14/14 2040  05/16/14 0550  TSH 0.060*  --   --   FREET4  --   < > 1.33  < > = values in this interval not displayed. Urinalysis:  Recent Labs Lab 05/14/14 1703 05/15/14 2000  COLORURINE YELLOW YELLOW  LABSPEC 1.025 1.020  PHURINE 5.0 6.0  GLUCOSEU >1000* >1000*  HGBUR TRACE* TRACE*  BILIRUBINUR NEGATIVE NEGATIVE  KETONESUR >80* 40*  PROTEINUR NEGATIVE 100*  UROBILINOGEN 0.2 0.2  NITRITE NEGATIVE NEGATIVE  LEUKOCYTESUR NEGATIVE NEGATIVE    Micro Results: Recent Results (from the  past 240 hour(s))  MRSA PCR Screening     Status: None   Collection Time: 05/14/14  6:04 PM  Result Value Ref Range Status   MRSA by PCR NEGATIVE NEGATIVE Final    Comment:        The GeneXpert MRSA Assay (FDA approved for NASAL specimens only), is one component of a comprehensive MRSA colonization surveillance program. It is not intended to diagnose MRSA infection nor to guide or monitor treatment for MRSA infections.    Studies/Results: No results found. Medications:  I have reviewed the patient's current medications. Prior to Admission:  Prescriptions prior to admission  Medication Sig Dispense Refill Last Dose  . amLODipine (NORVASC) 10 MG tablet Take 10 mg by mouth at bedtime.    05/13/2014 at Unknown time  . aspirin EC 81 MG tablet  Take 81 mg by mouth every evening.   05/13/2014 at Unknown time  . furosemide (LASIX) 40 MG tablet Take 40 mg by mouth daily.   05/13/2014 at Unknown time  . glipiZIDE (GLUCOTROL XL) 2.5 MG 24 hr tablet Take 2.5 mg by mouth daily.   05/13/2014 at Unknown time  . Magnesium 500 MG TABS Take 500 mg by mouth daily.   05/13/2014 at Unknown time  . Multiple Vitamin (MULTIVITAMIN WITH MINERALS) TABS tablet Take 1 tablet by mouth daily.   05/13/2014 at Unknown time  . mycophenolate (CELLCEPT) 250 MG capsule Take 500 mg by mouth 2 (two) times daily.   05/13/2014 at Unknown time  . pantoprazole (PROTONIX) 40 MG tablet Take 40 mg by mouth daily.   05/13/2014 at Unknown time  . potassium chloride (KLOR-CON) 8 MEQ tablet Take 8 mEq by mouth daily.   05/13/2014 at Unknown time  . pravastatin (PRAVACHOL) 40 MG tablet Take 40 mg by mouth every evening.   05/13/2014 at Unknown time  . predniSONE (DELTASONE) 5 MG tablet Take 5 mg by mouth daily.   05/13/2014 at Unknown time  . sodium bicarbonate 650 MG tablet Take 650 mg by mouth as needed for heartburn.   05/13/2014 at Unknown time  . sulfamethoxazole-trimethoprim (BACTRIM,SEPTRA) 400-80 MG per tablet Take 1 tablet by mouth every Monday, Wednesday, and Friday.   Past Week at Unknown time  . tacrolimus (PROGRAF) 0.5 MG capsule Take 0.5 mg by mouth 2 (two) times daily. Takes total of 4.5mg  twice daily   05/13/2014 at Unknown time  . tacrolimus (PROGRAF) 1 MG capsule Take 4 mg by mouth 2 (two) times daily. Takes 4.5mg  total twice daily   05/13/2014 at Unknown time  . [DISCONTINUED] levothyroxine (SYNTHROID, LEVOTHROID) 100 MCG tablet Take 100 mcg by mouth daily.   05/13/2014 at Unknown time   Scheduled Meds: . amLODipine  10 mg Oral QHS  . antiseptic oral rinse  7 mL Mouth Rinse BID  . calcium carbonate  1 tablet Oral Daily  . insulin aspart  0-5 Units Subcutaneous QHS  . insulin aspart  0-9 Units Subcutaneous TID WC  . insulin glargine  20 Units Subcutaneous QHS    . levothyroxine  100 mcg Oral QAC breakfast  . metoprolol tartrate  12.5 mg Oral BID  . mycophenolate  500 mg Oral BID  . pantoprazole  20 mg Oral Q breakfast  . predniSONE  5 mg Oral Daily  . tacrolimus  4.5 mg Oral BID   Continuous Infusions:   PRN Meds:.acetaminophen, bismuth subsalicylate, ondansetron (ZOFRAN) IV, promethazine Assessment/Plan: Active Problems:   DKA (diabetic ketoacidoses)   Diabetic ketoacidosis   Diabetic  ketoacidosis without coma associated with diabetes mellitus due to underlying condition  DKA: Patient has been tolerating a carb modified diet without nausea or vomiting. CBGs in the 100s-200s. Bicarb 19, AG 15 this morning. Patient will need to be transitioned to basal bolus insulin on discharge with close follow up with PCP.  -Lantus 20 units QHS -SSI-sensitive -Zofran 4 mg Q6H prn  -BMET tomorrow am -Card mod diet -Anti-GAD pending -Tums prn -Bismuth Subsalicylate 30 mL [id number] prn  Thrombocytopenia: Platelets have trended 283>192>125>133, stable. Unclear etiology. -Continue to monitor.   HTN: Blood pressure well controlled.  -Continue Amlodipine 10 mg qd -Continue Metoprolol 12.5 mg bid  H/o Renal Transplant: H/o donor transplant in 2010 at [facility], followed by Dr. Franchot Gallo. BUN/Cr has been slightly trending up 10/1.29 this morning. 1.57 on admission. -Continue Cellcept 500 mg bid  -Continue Tacrolimus 4.5 mg bid -Continue Prednisone 5 mg qd -Prograf level pending  Hypothyroidism: Patient is on levothyroxine 100 mcg daily at home. TSH 0.06, Free T4 normal at 1.33. Low TSH most likely secondary to sick euthyroid syndrome.   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 On ASA 81 mg daily at home.  DVT/PE ppx: SCDs  Dispo: Disposition is deferred at this time, awaiting improvement of current medical problems.  Anticipated discharge in approximately 1-2 day(s).   The patient does have a current PCP (No primary care provider on  file.) and does need an [facility] hospital follow-up appointment after discharge.  The patient does have transportation limitations that hinder transportation to clinic appointments.  .Services Needed at time of discharge: Y = Yes, Blank = No PT:   OT:   RN:   Equipment:   Other:     LOS: 5 days    Osa Craver, DO PGY-1 Internal Medicine Resident Pager # [phone] 05/19/2014 11:03 AM

## 2014-05-19 NOTE — Progress Notes (Signed)
Results for ORI, EHRESMAN (MRN [id number]) as of 05/19/2014 13:01  Ref. Range 05/18/2014 14:12 05/18/2014 15:55 05/18/2014 21:47 05/19/2014 07:22 05/19/2014 11:49  Glucose-Capillary Latest Range: 70-99 mg/dL 166 (H) 221 (H) 192 (H) 209 (H) 223 (H)  CBGs continue to be greater than 180 mg/dl.  Continues on Lantus 20 units daily.  Recommend increasing Novolog correction scale to MODERATE TID & HS. Will follow while in hospital.  Harvel Ricks RN BSN CDE

## 2014-05-19 NOTE — Progress Notes (Addendum)
Nutrition Consult/Brief Note  RD consulted for DM diet education.  This RD educated pt and provided handouts "Carbohydrate Counting for People with Diabetes" from [employer] 11/23.  Pt with no further questions and/or concerns at this time.  Arthur Holms, RD, LDN Pager #: [phone] After-Hours Pager #: [phone]

## 2014-05-19 NOTE — Plan of Care (Signed)
Problem: Phase I Progression Outcomes Goal: Order "Choose to Live" book from Pharmacy Outcome: Completed/Met Date Met:  05/19/14

## 2014-05-19 NOTE — Progress Notes (Signed)
Spoke with patient about her diabetes and taking insulin after discharge.  Showed her the insulin pen and how to use.  She demonstrated usage for me with practice pen.  Has insulin pen starter kit in room.  Staff RN to have patient give own insulin injection later today.  Recommend having patient watch DM videos on insulin #506 and #508 and #511 which is on the insulin pen.  Will need prescription for home blood glucose meter, strips, and lancets at discharge.  The question is whether patient will go home on just Lantus or both basal and short acting insulin.  States that she only eats 2 meals per day.  Sick day rules will be covered by staff RN.   Will continue to follow while in hospital. Harvel Ricks RN BSN CDE

## 2014-05-19 NOTE — Progress Notes (Signed)
Internal Medicine Attending  Date: 05/19/2014  Patient name: [name] Medical record number: [id number] Date of birth: August 10, 1945 Age: 68 y.o. Gender: female  I saw and evaluated the patient this afternoon and I discussed her care with resident.  I reviewed the resident's note by Dr. Marvel Plan, and I agree with the resident's findings and plans as documented in her note.  Dr. Beryle Beams will cover as attending physician 05/20/2014 through 05/23/2014.

## 2014-05-19 NOTE — Plan of Care (Signed)
Problem: Phase II Progression Outcomes Goal: CBGs stable on SQ insulin Outcome: Completed/Met Date Met:  05/19/14

## 2014-05-19 NOTE — Progress Notes (Signed)
Pt watched videos # [phone] and 026 discussed using insulin pen with pt. From kit

## 2014-05-20 DIAGNOSIS — E1140 Type 2 diabetes mellitus with diabetic neuropathy, unspecified: Secondary | ICD-10-CM

## 2014-05-20 DIAGNOSIS — Z794 Long term (current) use of insulin: Secondary | ICD-10-CM

## 2014-05-20 DIAGNOSIS — E0810 Diabetes mellitus due to underlying condition with ketoacidosis without coma: Secondary | ICD-10-CM

## 2014-05-20 LAB — GLUCOSE, CAPILLARY: GLUCOSE-CAPILLARY: 125 mg/dL — AB (ref 70–99)

## 2014-05-20 LAB — CBC
HEMATOCRIT: 32 % — AB (ref 36.0–46.0)
HEMOGLOBIN: 10.7 g/dL — AB (ref 12.0–15.0)
MCH: 29.6 pg (ref 26.0–34.0)
MCHC: 33.4 g/dL (ref 30.0–36.0)
MCV: 88.6 fL (ref 78.0–100.0)
Platelets: 148 10*3/uL — ABNORMAL LOW (ref 150–400)
RBC: 3.61 MIL/uL — AB (ref 3.87–5.11)
RDW: 15.4 % (ref 11.5–15.5)
WBC: 7.3 10*3/uL (ref 4.0–10.5)

## 2014-05-20 LAB — BASIC METABOLIC PANEL
ANION GAP: 15 (ref 5–15)
BUN: 17 mg/dL (ref 6–23)
CALCIUM: 8.9 mg/dL (ref 8.4–10.5)
CHLORIDE: 102 meq/L (ref 96–112)
CO2: 17 mEq/L — ABNORMAL LOW (ref 19–32)
CREATININE: 1.48 mg/dL — AB (ref 0.50–1.10)
GFR calc Af Amer: 41 mL/min — ABNORMAL LOW (ref >90)
GFR calc non Af Amer: 36 mL/min — ABNORMAL LOW (ref >90)
GLUCOSE: 133 mg/dL — AB (ref 70–99)
Potassium: 3.9 mEq/L (ref 3.7–5.3)
Sodium: 134 mEq/L — ABNORMAL LOW (ref 137–147)

## 2014-05-20 MED ORDER — INSULIN GLARGINE 100 UNIT/ML SOLOSTAR PEN: 25.0000 [IU] | PEN_INJECTOR | Freq: Every day | SUBCUTANEOUS | Status: DC

## 2014-05-20 MED ORDER — ACCU-CHEK AVIVA PLUS W/DEVICE KIT: 1.0000 | PACK | Freq: Two times a day (BID) | Status: DC

## 2014-05-20 MED ORDER — TACROLIMUS 1 MG PO CAPS: 4.0000 mg | ORAL_CAPSULE | Freq: Two times a day (BID) | ORAL | Status: DC

## 2014-05-20 MED ORDER — TACROLIMUS 0.5 MG PO CAPS: 0.5000 mg | ORAL_CAPSULE | Freq: Two times a day (BID) | ORAL | Status: DC

## 2014-05-20 MED ORDER — ACCU-CHEK FASTCLIX LANCET KIT: 1.0000 | PACK | Freq: Two times a day (BID) | Status: DC

## 2014-05-20 MED ORDER — INSULIN ASPART 100 UNIT/ML ~~LOC~~ SOLN
0.0000 [IU] | Freq: Three times a day (TID) | SUBCUTANEOUS | Status: DC
  Administered 2014-05-20: 2 [IU] via SUBCUTANEOUS

## 2014-05-20 MED ORDER — GLUCOSE BLOOD VI STRP: ORAL_STRIP | Status: DC

## 2014-05-20 MED ORDER — SODIUM CHLORIDE 0.9 % IV SOLN
INTRAVENOUS | Status: DC
Start: 2014-05-20 — End: 2014-05-20

## 2014-05-20 MED ORDER — INSULIN ASPART 100 UNIT/ML ~~LOC~~ SOLN: 0.0000 [IU] | Freq: Every day | SUBCUTANEOUS | Status: DC

## 2014-05-20 MED ORDER — MYCOPHENOLATE MOFETIL 250 MG PO CAPS: 500.0000 mg | ORAL_CAPSULE | Freq: Two times a day (BID) | ORAL | Status: AC

## 2014-05-20 MED ORDER — METOPROLOL TARTRATE 12.5 MG HALF TABLET: 12.5000 mg | ORAL_TABLET | Freq: Two times a day (BID) | ORAL | Status: DC

## 2014-05-20 NOTE — Progress Notes (Signed)
Subjective:  Patient was seen and examined this morning. Patient has no complaints. She states she is doing very well and understands how to inject herself with 25 units of lantus. Patient wishes to go home.  Objective: Vital signs in last 24 hours: Filed Vitals:   05/19/14 1600 05/19/14 2100 05/19/14 2236 05/20/14 0601  BP: 106/67  117/66 125/64  Pulse: 90  102 88  Temp: 98.5 F (36.9 C) 99 F (37.2 C) 99 F (37.2 C) 99.1 F (37.3 C)  TempSrc: Oral  Oral Oral  Resp: 16  17 18   Height:      Weight:      SpO2: 100%  98% 99%   Weight change:   Intake/Output Summary (Last 24 hours) at 05/20/14 [id number] Last data filed at 05/20/14 0600  Gross per 24 hour  Intake 2238.75 ml  Output    600 ml  Net 1638.75 ml   General: Vital signs reviewed. Patient is well-developed and well-nourished, in no distress and cooperative with exam.  HEENT: Moist mucous membranes. Poor dentition.  Cardiovascular: Tachycardic, regular rate, S1 normal, S2 normal, no murmurs, gallops, or rubs. Pulmonary/Chest: Clear to auscultation bilaterally, no wheezes, rales, or rhonchi. Abdominal: Soft, non-tender, obese, non-distended, BS +, no masses, organomegaly, or guarding present.  Musculoskeletal: No joint deformities, erythema, or stiffness, ROM full and nontender. Extremities: Lower extremities tender on palpation, but no edema, erythema. Negative homans. No lower extremity edema bilaterally, pulses symmetric and intact bilaterally. No cyanosis or clubbing. Neurological: A&O x3 Skin: No erythema, rashes, or wounds.  Psychiatric: Normal mood and affect. speech and behavior is normal. Cognition and memory are normal.   Lab Results: Basic Metabolic Panel:  Recent Labs Lab 05/14/14 2040  05/19/14 0740 05/20/14 0445  NA 148*  < > 133* 134*  K 4.3  < > 3.9 3.9  CL 109  < > 99 102  CO2 8*  < > 19 17*  GLUCOSE 185*  < > 204* 133*  BUN 29*  < > 10 17  CREATININE 1.17*  < > 1.29* 1.48*  CALCIUM 9.6  <  > 9.4 8.9  MG 2.5  --   --   --   PHOS 1.7*  --   --   --   < > = values in this interval not displayed. CBC:  Recent Labs Lab 05/19/14 0740 05/20/14 0445  WBC 7.4 7.3  NEUTROABS 4.2  --   HGB 13.7 10.7*  HCT 40.8 32.0*  MCV 88.3 88.6  PLT 133* 148*   Cardiac Enzymes:  Recent Labs Lab 05/18/14 1220 05/18/14 1814 05/18/14 2351  TROPONINI <0.30 <0.30 <0.30   BNP:  Recent Labs Lab 05/14/14 1230  PROBNP 258.6*   CBG:  Recent Labs Lab 05/18/14 2147 05/19/14 0722 05/19/14 1149 05/19/14 1729 05/19/14 2141 05/20/14 0746  GLUCAP 192* 209* 223* 238* 214* 125*   Hemoglobin A1C:  Recent Labs Lab 05/14/14 2040  HGBA1C 15.0*   Thyroid Function Tests:  Recent Labs Lab 05/14/14 2040  05/16/14 0550  TSH 0.060*  --   --   FREET4  --   < > 1.33  < > = values in this interval not displayed. Urinalysis:  Recent Labs Lab 05/14/14 1703 05/15/14 2000  COLORURINE YELLOW YELLOW  LABSPEC 1.025 1.020  PHURINE 5.0 6.0  GLUCOSEU >1000* >1000*  HGBUR TRACE* TRACE*  BILIRUBINUR NEGATIVE NEGATIVE  KETONESUR >80* 40*  PROTEINUR NEGATIVE 100*  UROBILINOGEN 0.2 0.2  NITRITE NEGATIVE NEGATIVE  LEUKOCYTESUR NEGATIVE NEGATIVE  Micro Results: Recent Results (from the past 240 hour(s))  MRSA PCR Screening     Status: None   Collection Time: 05/14/14  6:04 PM  Result Value Ref Range Status   MRSA by PCR NEGATIVE NEGATIVE Final    Comment:        The GeneXpert MRSA Assay (FDA approved for NASAL specimens only), is one component of a comprehensive MRSA colonization surveillance program. It is not intended to diagnose MRSA infection nor to guide or monitor treatment for MRSA infections.    Studies/Results: No results found. Medications:  I have reviewed the patient's current medications. Prior to Admission:  Prescriptions prior to admission  Medication Sig Dispense Refill Last Dose  . amLODipine (NORVASC) 10 MG tablet Take 10 mg by mouth at bedtime.     05/13/2014 at Unknown time  . aspirin EC 81 MG tablet Take 81 mg by mouth every evening.   05/13/2014 at Unknown time  . furosemide (LASIX) 40 MG tablet Take 40 mg by mouth daily.   05/13/2014 at Unknown time  . glipiZIDE (GLUCOTROL XL) 2.5 MG 24 hr tablet Take 2.5 mg by mouth daily.   05/13/2014 at Unknown time  . Magnesium 500 MG TABS Take 500 mg by mouth daily.   05/13/2014 at Unknown time  . Multiple Vitamin (MULTIVITAMIN WITH MINERALS) TABS tablet Take 1 tablet by mouth daily.   05/13/2014 at Unknown time  . mycophenolate (CELLCEPT) 250 MG capsule Take 500 mg by mouth 2 (two) times daily.   05/13/2014 at Unknown time  . pantoprazole (PROTONIX) 40 MG tablet Take 40 mg by mouth daily.   05/13/2014 at Unknown time  . potassium chloride (KLOR-CON) 8 MEQ tablet Take 8 mEq by mouth daily.   05/13/2014 at Unknown time  . pravastatin (PRAVACHOL) 40 MG tablet Take 40 mg by mouth every evening.   05/13/2014 at Unknown time  . predniSONE (DELTASONE) 5 MG tablet Take 5 mg by mouth daily.   05/13/2014 at Unknown time  . sodium bicarbonate 650 MG tablet Take 650 mg by mouth as needed for heartburn.   05/13/2014 at Unknown time  . sulfamethoxazole-trimethoprim (BACTRIM,SEPTRA) 400-80 MG per tablet Take 1 tablet by mouth every Monday, Wednesday, and Friday.   Past Week at Unknown time  . tacrolimus (PROGRAF) 0.5 MG capsule Take 0.5 mg by mouth 2 (two) times daily. Takes total of 4.5mg  twice daily   05/13/2014 at Unknown time  . tacrolimus (PROGRAF) 1 MG capsule Take 4 mg by mouth 2 (two) times daily. Takes 4.5mg  total twice daily   05/13/2014 at Unknown time  . [DISCONTINUED] levothyroxine (SYNTHROID, LEVOTHROID) 100 MCG tablet Take 100 mcg by mouth daily.   05/13/2014 at Unknown time   Scheduled Meds: . amLODipine  10 mg Oral QHS  . antiseptic oral rinse  7 mL Mouth Rinse BID  . calcium carbonate  1 tablet Oral Daily  . insulin aspart  0-15 Units Subcutaneous TID WC  . insulin aspart  0-5 Units  Subcutaneous QHS  . insulin glargine  20 Units Subcutaneous QHS  . levothyroxine  100 mcg Oral QAC breakfast  . metoprolol tartrate  12.5 mg Oral BID  . mycophenolate  500 mg Oral BID  . pantoprazole  20 mg Oral Q breakfast  . predniSONE  5 mg Oral Daily  . senna-docusate  2 tablet Oral QHS  . tacrolimus  4.5 mg Oral BID   Continuous Infusions: . sodium chloride 150 mL/hr at 05/20/14 0656   PRN Meds:.acetaminophen,  bismuth subsalicylate, ondansetron (ZOFRAN) IV, promethazine Assessment/Plan: Principal Problem:   Diabetic ketoacidosis without coma associated with diabetes mellitus due to underlying condition Active Problems:   Diabetic neuropathy   History of kidney transplant  DKA: Resolved. Patient has been tolerating a carb modified diet without nausea or vomiting. CBGs in the 200s. Bicarb 17, AG 15 this morning. Patient will be transitioned to basal insulin on discharge. Anti-GAD negative. Patient will be discharge on Lantus 25 units daily. She will follow up with me in clinic on 06/01/14. Patient may need to be increased on lantus or added on basal insulin at that time. Patient has received education on Lantus injections and CBG checks. I have asked her to please ask the pharmacist for a demonstration when she picks up her medications.  -Lantus 25 units QHS on discharge -SSI-moderate -Zofran 4 mg Q6H prn  -Card mod diet -Tums prn -Bismuth Subsalicylate 30 mL [id number] prn  Thrombocytopenia: Platelets have trended 283>192>125>133>148, stable. Unclear etiology. -Continue to monitor.   HTN: Blood pressure well controlled.  -Continue Amlodipine 10 mg qd -Continue Metoprolol 12.5 mg bid  H/o Renal Transplant: H/o donor transplant in 2010 at [facility], followed by Dr. Franchot Gallo. BUN/Cr has been slightly trending up 10/1.29>17/1.48 this morning. 1.57 on admission. This appears to about her baseline, but we will give more fluids before discharge. Patient will need to follow up with Dr. Franchot Gallo. She  states she has an appointment with her on 06/24/14. -Continue Cellcept 500 mg bid  -Continue Tacrolimus 4.5 mg bid -Continue Prednisone 5 mg qd -Prograf level pending -NS 150 mL/hr  Hypothyroidism: Patient is on levothyroxine 100 mcg daily at home. TSH 0.06, Free T4 normal at 1.33. Low TSH most likely secondary to sick euthyroid syndrome.   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 On ASA 81 mg daily at home.  DVT/PE ppx: SCDs  Dispo: Disposition is deferred at this time, awaiting improvement of current medical problems.  Anticipated discharge in approximately 1-2 day(s).   The patient does have a current PCP (No primary care provider on file.) and does need an [facility] hospital follow-up appointment after discharge.  The patient does have transportation limitations that hinder transportation to clinic appointments.  .Services Needed at time of discharge: Y = Yes, Blank = No PT:   OT:   RN:   Equipment:   Other:     LOS: 6 days    Osa Craver, DO PGY-1 Internal Medicine Resident Pager # [phone] 05/20/2014 9:22 AM

## 2014-05-20 NOTE — Progress Notes (Signed)
D/c to home with family. Instructions given and prescriptions given and demonstrated understanding. Breathing regular and unlabored. Pain denied.  VSS.

## 2014-05-20 NOTE — Progress Notes (Signed)
Patient ID: [name], female   DOB: [birth year], 68 y.o.   MRN: [id number] Medicine attending: I personally interviewed and examined this patient together with medical resident Dr. Osa Craver and I concur with her evaluation and management plan. The patient was admitted with diabetic ketoacidosis. She has chronic renal insufficiency with history of a prior kidney transplant in 2010. Blood sugar stabilized rapidly with hydration and parenteral insulin. Renal function on day of discharge with BUN 17, creatinine 1.5. Glucose 133.  She will be discharged today on Lantus insulin 25 units daily and follow-up with Dr. Marvel Plan in the outpatient clinic in one week.

## 2014-05-20 NOTE — Discharge Instructions (Addendum)
Thank you for allowing [place] to be involved in your healthcare while you were hospitalized at [facility].   Please note that there have been changes to your home medications.  --> PLEASE LOOK AT YOUR DISCHARGE MEDICATION LIST FOR DETAILS.  Please call your PCP if you have any questions or concerns, or any difficulty getting any of your medications.  Please return to the ER if you have worsening of your symptoms or new severe symptoms arise.  Please continue to take your medications as prescribed, but STOP taking glipizide.  Please inject LANTUS 25 UNITS once a day at BEDTIME.  Please check you blood sugar twice a day and keep a record of your blood sugars for when we meet at your next appointment.  I have ordered you a glucose monitor, strips, lancets, and an insulin pen. Please ASK YOUR PHARMACIST for help or instructions if you would like them to go over it with you.  Type 1 Diabetes Mellitus Type 1 diabetes mellitus, often simply referred to as diabetes, is a long-term (chronic) disease. It occurs when the islet cells in the pancreas that make insulin (a hormone) are destroyed and can no longer make insulin. Insulin is needed to move sugars from food into the tissue cells. The tissue cells use the sugars for energy. In people with type 1 diabetes, the sugars build up in the blood instead of going into the tissue cells. As a result, high blood sugar (hyperglycemia) develops. Without insulin, the body breaks down fat cells for the needed energy. This breakdown of fat cells produces acid chemicals (ketones), which increases the acid levels in the body. The effect of either high ketone or high sugar (glucose) levels can be life-threatening.  Type 1 diabetes was also previously called juvenile diabetes. It most often occurs before the age of 43, but it can occur at any age. RISK FACTORS A person is predisposed to developing type 1 diabetes if someone in his or her family has the  disease and is exposed to certain additional environmental triggers.  SYMPTOMS  Symptoms of type 1 diabetes may develop gradually over days to weeks or suddenly. The symptoms occur due to hyperglycemia. The symptoms can include:   Increased thirst (polydipsia).  Increased urination (polyuria).  Increased urination during the night (nocturia).  Weight loss. This weight loss may be rapid.  Frequent, recurring infections.  Tiredness (fatigue).  Weakness.  Vision changes, such as blurred vision.  Fruity smell to your breath.  Abdominal pain.  Nausea or vomiting. DIAGNOSIS  Type 1 diabetes is diagnosed when symptoms of diabetes are present and when blood glucose levels are increased. Your blood glucose level may be checked by one or more of the following blood tests:  A fasting blood glucose test. You will not be allowed to eat for at least 8 hours before a blood sample is taken.  A random blood glucose test. Your blood glucose is checked at any time of the day regardless of when you ate.  A hemoglobin A1c blood glucose test. A hemoglobin A1c test provides information about blood glucose control over the previous 3 months. TREATMENT  Although type 1 diabetes cannot be prevented, it can be managed with insulin, diet, and exercise.  You will need to take insulin daily to keep blood glucose in the desired range.  You will need to match insulin dosing with exercise and healthy food choices. The treatment goal is to maintain the before-meal blood sugar (preprandial glucose) level  at 70-130 mg/dL.  HOME CARE INSTRUCTIONS   Have your hemoglobin A1c level checked twice a year.  Perform daily blood glucose monitoring as directed by your health care provider.  Monitor urine ketones when you are ill and as directed by your health care provider.  Take your insulin as directed by your health care provider to maintain your blood glucose level in the desired range.  Never run out of  insulin. It is needed every day.  Adjust insulin based on your intake of carbohydrates. Carbohydrates can raise blood glucose levels but need to be included in your diet. Carbohydrates provide vitamins, minerals, and fiber, which are an essential part of a healthy diet. Carbohydrates are found in fruits, vegetables, whole grains, dairy products, legumes, and foods containing added sugars.  Eat healthy foods. Alternate 3 meals with 3 snacks.  Maintain a healthy weight.  Carry a medical alert card or wear your medical alert jewelry.  Carry a 15-gram carbohydrate snack with you at all times to treat low blood glucose (hypoglycemia). Some examples of 15-gram carbohydrate snacks include:  Glucose tablets, 3 or 4.  Glucose gel, 15-gram tube.  Raisins, 2 tablespoons (24 grams).  Jelly beans, 6.  Animal crackers, 8.  Fruit juice, regular soda, or low-fat milk, 4 ounces (120 mL).  Gummy treats, 9.  Recognize hypoglycemia. Hypoglycemia occurs with blood glucose levels of 70 mg/dL and below. The risk for hypoglycemia increases when fasting or skipping meals, during or after intense exercise, and during sleep. Hypoglycemia symptoms can include:  Tremors or shakes.  Decreased ability to concentrate.  Sweating.  Increased heart rate.  Headache.  Dry mouth.  Hunger.  Irritability.  Anxiety.  Restless sleep.  Altered speech or coordination.  Confusion.  Treat hypoglycemia promptly. If you are alert and able to safely swallow, follow the 15:15 rule:  Take 15-20 grams of rapid-acting glucose or carbohydrate. Rapid-acting options include glucose gel, glucose tablets, or 4 ounces (120 mL) of fruit juice, regular soda, or low-fat milk.  Check your blood glucose level 15 minutes after taking the glucose.  Take 15-20 grams more of glucose if the repeat blood glucose level is still 70 mg/dL or below.  Eat a meal or snack within 1 hour once blood glucose levels return to  normal.  Be alert to polyuria and polydipsia, which are early signs of hyperglycemia. An early awareness of hyperglycemia allows for prompt treatment. Treat hyperglycemia as directed by your health care provider.  Exercise regularly as directed by your health care provider. This includes:  Performing resistance training twice a week such as push-ups, sit-ups, lifting weights, or using resistance bands.  Performing 150 minutes of cardio exercises each week such as walking, running, or playing sports.  Staying active and spending no more than 90 minutes at one time being inactive.  Adjust your insulin dosing and food intake as needed if you start a new exercise or sport.  Follow your sick-day plan at any time you are unable to eat or drink as usual.   Do not use any tobacco products including cigarettes, chewing tobacco, or electronic cigarettes. If you need help quitting, ask your health care provider.  Limit alcohol intake to no more than 1 drink per day for nonpregnant women and 2 drinks per day for men. You should drink alcohol only when you are also eating food. Talk with your health care provider about whether alcohol is safe for you. Tell your health care provider if you drink alcohol several times  a week.  Keep all follow-up visits as directed by your health care provider.  Schedule an eye exam within 5 years of diagnosis and then annually.  Perform daily skin and foot care. Examine your skin and feet daily for cuts, bruises, redness, nail problems, bleeding, blisters, or sores. A foot exam by a health care provider should be done annually.  Brush your teeth and gums at least twice a day and floss at least once a day. Follow up with your dentist regularly.  Share your diabetes management plan with your workplace or school.  Stay up-to-date with immunizations. It is recommended that people with diabetes who are over 20 years old get the pneumonia vaccine. In some cases, two  separate shots may be given. Ask your health care provider if your pneumonia vaccination is up-to-date.  Learn to manage stress.  Obtain ongoing diabetes education and support as needed.  Participate in or seek rehabilitation as needed to maintain or improve independence and quality of life. Request a physical or occupational therapy referral if you are having foot or hand numbness, or difficulties with grooming, dressing, eating, or physical activity. SEEK MEDICAL CARE IF:   You are unable to eat food or drink fluids for more than 6 hours.  You have nausea and vomiting for more than 6 hours.  Your blood glucose level is over 240 mg/dL.  There is a change in mental status.  You develop an additional serious illness.  You have diarrhea for more than 6 hours.  You have been sick or have had a fever for a couple of days and are not getting better.  You have pain during any physical activity. SEEK IMMEDIATE MEDICAL CARE IF:  You have difficulty breathing.  You have moderate to large ketone levels. MAKE SURE YOU:  Understand these instructions.  Will watch your condition.  Will get help right away if you are not doing well or get worse. Document Released: 06/08/2000 Document Revised: 10/26/2013 Document Reviewed: 01/08/2012 [facility] Patient Information 2015 [place], [place]. This information is not intended to replace advice given to you by your health care provider. Make sure you discuss any questions you have with your health care provider.   Insulin Glargine injection What is this medicine? INSULIN GLARGINE (IN su lin GLAR geen) is a human-made form of insulin. This drug lowers the amount of sugar in your blood. It is a long-acting insulin that is usually given once a day. This medicine may be used for other purposes; ask your health care provider or pharmacist if you have questions. COMMON BRAND NAME(S): Lantus, Lantus SoloStar, Toujeo SoloStar What should I tell my health  care provider before I take this medicine? They need to know if you have any of these conditions: -episodes of hypoglycemia -kidney disease -liver disease -an unusual or allergic reaction to insulin, metacresol, other medicines, foods, dyes, or preservatives -pregnant or trying to get pregnant -breast-feeding How should I use this medicine? This medicine is for injection under the skin. Use this medicine at the same time each day. Use exactly as directed. This insulin should never be mixed in the same syringe with other insulins before injection. Do not vigorously shake before use. You will be taught how to use this medicine and how to adjust doses for activities and illness. Do not use more insulin than prescribed. Always check the appearance of your insulin before using it. This medicine should be clear and colorless like water. Do not use it if it is cloudy,  thickened, colored, or has solid particles in it. It is important that you put your used needles and syringes in a special sharps container. Do not put them in a trash can. If you do not have a sharps container, call your pharmacist or healthcare provider to get one. Talk to your pediatrician regarding the use of this medicine in children. Special care may be needed. Overdosage: If you think you have taken too much of this medicine contact a poison control center or emergency room at once. NOTE: This medicine is only for you. Do not share this medicine with others. What if I miss a dose? It is important not to miss a dose. Your health care professional or doctor should discuss a plan for missed doses with you. If you do miss a dose, follow their plan. Do not take double doses. What may interact with this medicine? -other medicines for diabetes Many medications may cause an increase or decrease in blood sugar, these include: -alcohol containing beverages -aspirin and aspirin-like drugs -chloramphenicol -chromium -diuretics -female  hormones, like estrogens or progestins and birth control pills -heart medicines -isoniazid -female hormones or anabolic steroids -medicines for weight loss -medicines for allergies, asthma, cold, or cough -medicines for mental problems -medicines called MAO Inhibitors like Nardil, Parnate, Marplan, Eldepryl -niacin -NSAIDs, medicines for pain and inflammation, like ibuprofen or naproxen -pentamidine -phenytoin -probenecid -quinolone antibiotics like ciprofloxacin, levofloxacin, ofloxacin -some herbal dietary supplements -steroid medicines like prednisone or cortisone -thyroid medicine Some medications can hide the warning symptoms of low blood sugar. You may need to monitor your blood sugar more closely if you are taking one of these medications. These include: -beta-blockers such as atenolol, metoprolol, propranolol -clonidine -guanethidine -reserpine This list may not describe all possible interactions. Give your health care provider a list of all the medicines, herbs, non-prescription drugs, or dietary supplements you use. Also tell them if you smoke, drink alcohol, or use illegal drugs. Some items may interact with your medicine. What should I watch for while using this medicine? Visit your health care professional or doctor for regular checks on your progress. A test called the HbA1C (A1C) will be monitored. This is a simple blood test. It measures your blood sugar control over the last 2 to 3 months. You will receive this test every 3 to 6 months. Learn how to check your blood sugar. Learn the symptoms of low and high blood sugar and how to manage them. Always carry a quick-source of sugar with you in case you have symptoms of low blood sugar. Examples include hard sugar candy or glucose tablets. Make sure others know that you can choke if you eat or drink when you develop serious symptoms of low blood sugar, such as seizures or unconsciousness. They must get medical help at  once. Tell your doctor or health care professional if you have high blood sugar. You might need to change the dose of your medicine. If you are sick or exercising more than usual, you might need to change the dose of your medicine. Do not skip meals. Ask your doctor or health care professional if you should avoid alcohol. Many nonprescription cough and cold products contain sugar or alcohol. These can affect blood sugar. Make sure that you have the right kind of syringe for the type of insulin you use. Try not to change the brand and type of insulin or syringe unless your health care professional or doctor tells you to. Switching insulin brand or type can cause dangerously  high or low blood sugar. Always keep an extra supply of insulin, syringes, and needles on hand. Use a syringe one time only. Throw away syringe and needle in a closed container to prevent accidental needle sticks. Insulin pens and cartridges should never be shared. Even if the needle is changed, sharing may result in passing of viruses like hepatitis or HIV. Wear a medical ID bracelet or chain, and carry a card that describes your disease and details of your medicine and dosage times. What side effects may I notice from receiving this medicine? Side effects that you should report to your health care professional or doctor as soon as possible: -allergic reactions like skin rash, itching or hives, swelling of the face, lips, or tongue -breathing problems -signs and symptoms of high blood sugar such as dizziness, dry mouth, dry skin, fruity breath, nausea, stomach pain, increased hunger or thirst, increased urination -signs and symptoms of low blood sugar such as feeling anxious, confusion, dizziness, increased hunger, unusually weak or tired, sweating, shakiness, cold, irritable, headache, blurred vision, fast heartbeat, loss of consciousness Side effects that usually do not require medical attention (report to your health care  professional or doctor if they continue or are bothersome): -increase or decrease in fatty tissue under the skin due to overuse of a particular injection site -itching, burning, swelling, or rash at site where injected This list may not describe all possible side effects. Call your doctor for medical advice about side effects. You may report side effects to FDA at 1-800-FDA-1088. Where should I keep my medicine? Keep out of the reach of children. Store unopened vials in a refrigerator between 2 and 8 degrees C (36 and 46 degrees F). Do not freeze or use if the insulin has been frozen. Opened vials (vials currently in use) may be stored in the refrigerator or at room temperature, at approximately 25 degrees C (77 degrees F) or cooler. Keeping your insulin at room temperature decreases the amount of pain during injection. Once opened, your insulin can be used for 28 days. After 28 days, the vial should be thrown away. Store unopened pen-injector cartridges in a refrigerator between 2 and 8 degrees C (36 and 46 degrees F.) Do not freeze or use if the insulin has been frozen. Insulin cartridges inserted into the [employer] system should be kept at room temperature, approximately 25 degrees C (77 degrees F) or cooler. Do not store in the refrigerator. Once inserted into the [employer] system, the insulin can be used for 28 days. After 28 days, the cartridge should be thrown away. Protect from light and excessive heat. Throw away any unused medicine after the expiration date or after the specified time for room temperature storage has passed. NOTE: This sheet is a summary. It may not cover all possible information. If you have questions about this medicine, talk to your doctor, pharmacist, or health care provider.  2015, [employer]. (2013-08-20 10:13:56)

## 2014-05-23 LAB — TACROLIMUS LEVEL: Tacrolimus (FK506) - LabCorp: 6.2 ng/mL (ref 2.0–20.0)

## 2014-05-24 ENCOUNTER — Other Ambulatory Visit: Payer: Self-pay | Admitting: *Deleted

## 2014-05-24 MED ORDER — INSULIN PEN NEEDLE 32G X 4 MM MISC: 1.0000 "pen " | Freq: Every day | Status: DC

## 2014-05-24 MED ORDER — ACCU-CHEK FASTCLIX LANCET KIT: 1.0000 | PACK | Freq: Two times a day (BID) | Status: DC

## 2014-05-24 MED ORDER — GLUCOSE BLOOD VI STRP: ORAL_STRIP | Status: DC

## 2014-05-24 NOTE — Telephone Encounter (Signed)
Has insulin - no pen needles.  Uses [facility]. Hilda Blades Xela Oregel RN 05/24/14 2:20PM

## 2014-05-24 NOTE — Telephone Encounter (Signed)
4:45PM Pt called clinic again and states pharmacy at [facility]  never got Rx for test strips and lancets - it was confirmed 05/20/14 AM Please send both to [facility]. Hilda Blades Ottilie Wigglesworth RN 05/24/14 4:50PM

## 2014-05-24 NOTE — Telephone Encounter (Signed)
I sent in a prescription to [facility] for lancet, test strips and pen needles.

## 2014-05-25 ENCOUNTER — Telehealth: Payer: Self-pay | Admitting: Dietician

## 2014-05-25 NOTE — Telephone Encounter (Addendum)
Got pen needles and meter, but was not able to get strips or lancets to check her blood sugar. Has been taking her lantus 25 units each night. Has not had any problems using lantus pens. Is concerned about her hospital follow up appointment insurance coverage because her Medicaid card has [facility] on it and will not be cfhanged until next month.  Told patient if we cannot accept her Medicaid we'd call her before her appointment to let her know and CDE will assist with completing requested paperwork from [place] for her to get her testing supplies.

## 2014-05-25 NOTE — Telephone Encounter (Signed)
Pt aware and ask [name] to call pt about diabetes.

## 2014-05-26 NOTE — Telephone Encounter (Signed)
Spoke with patient about her appointment next week and her testing supplies. She verbalized understanding and denies having filled her test strips. She said she'd call her old pharmacy to look into what Medicare is not covering the testing supplies further.

## 2014-05-26 NOTE — Telephone Encounter (Signed)
[facility] says they received the faxed order from our office and she can get the testing supplies 06/10/14 per Medicare. Pharmacist suggests she had a meter previously and had already filled strips last month.  [place] office says her Medicaid card will be changed when she comes to her appointment next week and her visit will be covered. Tried calling patient to alert her to this information. Message l;eft for return call.

## 2014-05-27 ENCOUNTER — Encounter: Payer: Self-pay | Admitting: Internal Medicine

## 2014-06-01 ENCOUNTER — Encounter: Payer: Self-pay | Admitting: Internal Medicine

## 2014-06-01 ENCOUNTER — Ambulatory Visit (INDEPENDENT_AMBULATORY_CARE_PROVIDER_SITE_OTHER): Payer: Medicare Other | Admitting: Internal Medicine

## 2014-06-01 VITALS — BP 122/65 | HR 105 | Temp 98.0°F | Ht 68.0 in | Wt 212.5 lb

## 2014-06-01 DIAGNOSIS — D696 Thrombocytopenia, unspecified: Secondary | ICD-10-CM | POA: Diagnosis not present

## 2014-06-01 DIAGNOSIS — E039 Hypothyroidism, unspecified: Secondary | ICD-10-CM | POA: Diagnosis not present

## 2014-06-01 DIAGNOSIS — K219 Gastro-esophageal reflux disease without esophagitis: Secondary | ICD-10-CM | POA: Diagnosis not present

## 2014-06-01 DIAGNOSIS — I10 Essential (primary) hypertension: Secondary | ICD-10-CM | POA: Diagnosis not present

## 2014-06-01 DIAGNOSIS — K7689 Other specified diseases of liver: Secondary | ICD-10-CM

## 2014-06-01 DIAGNOSIS — G990 Autonomic neuropathy in diseases classified elsewhere: Secondary | ICD-10-CM | POA: Diagnosis not present

## 2014-06-01 DIAGNOSIS — Z940 Kidney transplant status: Secondary | ICD-10-CM

## 2014-06-01 DIAGNOSIS — E1143 Type 2 diabetes mellitus with diabetic autonomic (poly)neuropathy: Secondary | ICD-10-CM | POA: Diagnosis not present

## 2014-06-01 DIAGNOSIS — K769 Liver disease, unspecified: Secondary | ICD-10-CM

## 2014-06-01 LAB — COMPLETE METABOLIC PANEL WITH GFR
ALBUMIN: 4.2 g/dL (ref 3.5–5.2)
ALK PHOS: 101 U/L (ref 39–117)
ALT: 52 U/L — AB (ref 0–35)
AST: 31 U/L (ref 0–37)
BUN: 17 mg/dL (ref 6–23)
CO2: 25 mEq/L (ref 19–32)
Calcium: 9.8 mg/dL (ref 8.4–10.5)
Chloride: 95 mEq/L — ABNORMAL LOW (ref 96–112)
Creat: 1.34 mg/dL — ABNORMAL HIGH (ref 0.50–1.10)
GFR, Est African American: 47 mL/min — ABNORMAL LOW
GFR, Est Non African American: 41 mL/min — ABNORMAL LOW
GLUCOSE: 410 mg/dL — AB (ref 70–99)
Potassium: 4.7 mEq/L (ref 3.5–5.3)
SODIUM: 131 meq/L — AB (ref 135–145)
TOTAL PROTEIN: 7 g/dL (ref 6.0–8.3)
Total Bilirubin: 0.6 mg/dL (ref 0.2–1.2)

## 2014-06-01 LAB — CBC WITH DIFFERENTIAL/PLATELET
Basophils Absolute: 0 10*3/uL (ref 0.0–0.1)
Basophils Relative: 0 % (ref 0–1)
EOS PCT: 1 % (ref 0–5)
Eosinophils Absolute: 0.1 10*3/uL (ref 0.0–0.7)
HEMATOCRIT: 37.8 % (ref 36.0–46.0)
HEMOGLOBIN: 11.9 g/dL — AB (ref 12.0–15.0)
LYMPHS ABS: 1.2 10*3/uL (ref 0.7–4.0)
LYMPHS PCT: 16 % (ref 12–46)
MCH: 29.5 pg (ref 26.0–34.0)
MCHC: 31.5 g/dL (ref 30.0–36.0)
MCV: 93.6 fL (ref 78.0–100.0)
MONOS PCT: 5 % (ref 3–12)
MPV: 9.8 fL (ref 9.4–12.4)
Monocytes Absolute: 0.4 10*3/uL (ref 0.1–1.0)
Neutro Abs: 5.9 10*3/uL (ref 1.7–7.7)
Neutrophils Relative %: 78 % — ABNORMAL HIGH (ref 43–77)
Platelets: 273 10*3/uL (ref 150–400)
RBC: 4.04 MIL/uL (ref 3.87–5.11)
RDW: 16.1 % — ABNORMAL HIGH (ref 11.5–15.5)
WBC: 7.6 10*3/uL (ref 4.0–10.5)

## 2014-06-01 LAB — TSH: TSH: 1.201 u[IU]/mL (ref 0.350–4.500)

## 2014-06-01 MED ORDER — METOPROLOL TARTRATE 12.5 MG HALF TABLET: 12.5000 mg | ORAL_TABLET | Freq: Two times a day (BID) | ORAL | Status: DC

## 2014-06-01 MED ORDER — PANTOPRAZOLE SODIUM 40 MG PO TBEC: 40.0000 mg | DELAYED_RELEASE_TABLET | Freq: Every day | ORAL | Status: DC

## 2014-06-01 MED ORDER — PRAVASTATIN SODIUM 40 MG PO TABS: 40.0000 mg | ORAL_TABLET | Freq: Every evening | ORAL | Status: DC

## 2014-06-01 MED ORDER — FUROSEMIDE 40 MG PO TABS: 40.0000 mg | ORAL_TABLET | Freq: Every day | ORAL | Status: DC

## 2014-06-01 MED ORDER — AMLODIPINE BESYLATE 10 MG PO TABS
10.0000 mg | ORAL_TABLET | Freq: Every day | ORAL | Status: DC
Start: 2014-06-01 — End: 2014-06-08

## 2014-06-01 MED ORDER — ASPIRIN EC 81 MG PO TBEC
81.0000 mg | DELAYED_RELEASE_TABLET | Freq: Every evening | ORAL | Status: DC
Start: 2014-06-01 — End: 2015-06-27

## 2014-06-01 MED ORDER — INSULIN GLARGINE 100 UNIT/ML SOLOSTAR PEN: 25.0000 [IU] | PEN_INJECTOR | Freq: Every day | SUBCUTANEOUS | Status: DC

## 2014-06-01 MED ORDER — INSULIN GLARGINE 100 UNIT/ML SOLOSTAR PEN: 32.0000 [IU] | PEN_INJECTOR | Freq: Every day | SUBCUTANEOUS | Status: DC

## 2014-06-01 NOTE — Assessment & Plan Note (Addendum)
Assessment: Patient follows with Dr. Franchot Gallo at [facility], follow up appointment on 12/31. Patient has been compliant with Tacrolimus, Mycophenolate and Prednisone. Kidney function remained stable during admission, but we will recheck today.  Plan: -CMET -Continue Tacrolimus 4.5 mg BID -Continue Mycophenolate 500 mg BID -Continue Prednisone 5 mg daily -Continue Bactrim MWF -Follow up with Dr. Franchot Gallo  Addendum: Kidney function at baseline  Results for [name], [name] (MRN [id number]) as of 06/02/2014 08:13  Ref. Range 06/01/2014 12:03  BUN Latest Range: 6-23 mg/dL 17  Creatinine Latest Range: 0.50-1.10 mg/dL 1.34 (H)

## 2014-06-01 NOTE — Progress Notes (Signed)
INTERNAL MEDICINE TEACHING ATTENDING ADDENDUM - Aldine Contes, MD: I personally saw and evaluated Ms. Eichelberger in this clinic visit in conjunction with the resident, Dr. Marvel Plan. I have discussed patient's plan of care with medical resident during this visit. I have confirmed the physical exam findings and have read and agree with the clinic note including the plan with the following addition: - Pt with uncontrolled DM. Will increase lantus to 32 units today - May need to start premeal insulin on next visit - f/u in 1 week - Recheck TSH today. May need to decrease synthroid dose if persistently decreased - Will f/u urine microalbumin/creatinine ratio and consider adding ACE-I

## 2014-06-01 NOTE — Assessment & Plan Note (Addendum)
Assessment: Patient was recently discharged from the hospital on 05/20/14 after having DKA. Patient was discharged on Lantus 26 units daily. Patient states she has been taking Lantus 26 units daily, but her glucometer has been reading "high." Today in clinic, CBG was 428! Upon inspection of her Lantus pen, it does not appear patient was taking 26 units consistently. Butch Penny kindly educated her on using her Lantus pen and on checking her blood glucose using the glucometer.   Plan: -Diabetes Education -Follow up in 1 week with me  -Follow up in 1 week with Butch Penny -Provided CBG log -Lantus 32 units daily -Urine microalbumin/creatinine

## 2014-06-01 NOTE — Patient Instructions (Signed)
General Instructions:   Thank you for bringing your medicines today. This helps [place] keep you safe from mistakes.   TAKE LANTUS 32 UNITS DAILY. Please check your blood sugar before you eat in the morning, in the afternoon and at night. Please follow up in 1 week.   Please take all of your other medications as prescribed.  Blood Glucose Monitoring Monitoring your blood glucose (also know as blood sugar) helps you to manage your diabetes. It also helps you and your health care provider monitor your diabetes and determine how well your treatment plan is working. WHY SHOULD YOU MONITOR YOUR BLOOD GLUCOSE?  It can help you understand how food, exercise, and medicine affect your blood glucose.  It allows you to know what your blood glucose is at any given moment. You can quickly tell if you are having low blood glucose (hypoglycemia) or high blood glucose (hyperglycemia).  It can help you and your health care provider know how to adjust your medicines.  It can help you understand how to manage an illness or adjust medicine for exercise. WHEN SHOULD YOU TEST? Your health care provider will help you decide how often you should check your blood glucose. This may depend on the type of diabetes you have, your diabetes control, or the types of medicines you are taking. Be sure to write down all of your blood glucose readings so that this information can be reviewed with your health care provider. See below for examples of testing times that your health care provider may suggest. Type 1 Diabetes  Test 4 times a day if you are in good control, using an insulin pump, or perform multiple daily injections.  If your diabetes is not well controlled or if you are sick, you may need to monitor more often.  It is a good idea to also monitor:  Before and after exercise.  Between meals and 2 hours after a meal.  Occasionally between 2:00 a.m. and 3:00 a.m. Type 2 Diabetes  It can vary with each person,  but generally, if you are on insulin, test 4 times a day.  If you take medicines by mouth (orally), test 2 times a day.  If you are on a controlled diet, test once a day.  If your diabetes is not well controlled or if you are sick, you may need to monitor more often. HOW TO MONITOR YOUR BLOOD GLUCOSE Supplies Needed  Blood glucose meter.  Test strips for your meter. Each meter has its own strips. You must use the strips that go with your own meter.  A pricking needle (lancet).  A device that holds the lancet (lancing device).  A journal or log book to write down your results. Procedure  Wash your hands with soap and water. Alcohol is not preferred.  Prick the side of your finger (not the tip) with the lancet.  Gently milk the finger until a small drop of blood appears.  Follow the instructions that come with your meter for inserting the test strip, applying blood to the strip, and using your blood glucose meter. Other Areas to Get Blood for Testing Some meters allow you to use other areas of your body (other than your finger) to test your blood. These areas are called alternative sites. The most common alternative sites are:  The forearm.  The thigh.  The back area of the lower leg.  The palm of the hand. The blood flow in these areas is slower. Therefore, the blood glucose  values you get may be delayed, and the numbers are different from what you would get from your fingers. Do not use alternative sites if you think you are having hypoglycemia. Your reading will not be accurate. Always use a finger if you are having hypoglycemia. Also, if you cannot feel your lows (hypoglycemia unawareness), always use your fingers for your blood glucose checks. ADDITIONAL TIPS FOR GLUCOSE MONITORING  Do not reuse lancets.  Always carry your supplies with you.  All blood glucose meters have a 24-hour "hotline" number to call if you have questions or need help.  Adjust (calibrate) your  blood glucose meter with a control solution after finishing a few boxes of strips. BLOOD GLUCOSE RECORD KEEPING It is a good idea to keep a daily record or log of your blood glucose readings. Most glucose meters, if not all, keep your glucose records stored in the meter. Some meters come with the ability to download your records to your home computer. Keeping a record of your blood glucose readings is especially helpful if you are wanting to look for patterns. Make notes to go along with the blood glucose readings because you might forget what happened at that exact time. Keeping good records helps you and your health care provider to work together to achieve good diabetes management.  Document Released: 06/14/2003 Document Revised: 10/26/2013 Document Reviewed: 11/03/2012 [facility] Patient Information 2015 [place], [place]. This information is not intended to replace advice given to you by your health care provider. Make sure you discuss any questions you have with your health care provider.    Self Care Goals & Plans:  Self Care Goal 06/01/2014  Manage my medications take my medicines as prescribed; bring my medications to every visit; refill my medications on time  Monitor my health keep track of my blood glucose; bring my glucose meter and log to each visit  Eat healthy foods drink diet soda or water instead of juice or soda; eat more vegetables; eat foods that are low in salt; eat baked foods instead of fried foods

## 2014-06-01 NOTE — Assessment & Plan Note (Addendum)
BP Readings from Last 3 Encounters:  06/01/14 122/65  05/20/14 125/64  02/24/14 141/71    Lab Results  Component Value Date   NA 134* 05/20/2014   K 3.9 05/20/2014   CREATININE 1.48* 05/20/2014    Assessment: Blood pressure control:  Controlled Progress toward BP goal:   At goal Comments: Patient may benefit from ACEI/ARB  Plan: Medications:  continue current medications of amlodipine 10 mg daily and metoprolol 12.5 mg BID Educational resources provided:  none Self management tools provided:  none Other plans: Check urine microalbumin/creatinine. Consider adding ACEI  Addendum: Results for [name], [name] (MRN [id number]) as of 06/02/2014 08:13  Ref. Range 06/01/2014 12:12  Microalb, Ur Latest Range: <2.0 mg/dL 5.9 (H)  MICROALB/CREAT RATIO Latest Range: 0.0-30.0 mg/g 41.7 (H)  Creatinine, Urine No range found 141.6

## 2014-06-01 NOTE — Progress Notes (Signed)
Subjective:    Patient ID: DEJANIQUE RUEHL, female    DOB: 04/08/1946, 68 y.o.   MRN: [id number]  HPI Ms. Rubert is a 68 yo female with PMHx of T2DM, HTN, Hypothyroidism, and renal transplant who presents to the clinic for hospital follow up after DKA. Please see problem oriented assessment and plan for more information.  Review of Systems General: Admits to weight loss. Denies fever, chills, fatigue Respiratory: Denies SOB, cough   Cardiovascular: Denies chest pain and palpitations.  Gastrointestinal: Denies nausea, vomiting, abdominal pain Genitourinary: Denies dysuria, urgency, frequency, hematuria Musculoskeletal: Denies myalgias, back pain, joint swelling, arthralgias and gait problem.   Neurological: Denies dizziness, headaches, weakness, lightheadedness  Past Medical History  Diagnosis Date  . Diabetes mellitus   . Hypertension   . Renal disorder   . Morbid obesity     BMI 34  . Allergy   . Blood transfusion without reported diagnosis   . Cataract   . Clotting disorder   . Thyroid disease   . Ulcer    Current Outpatient Prescriptions on File Prior to Visit  Medication Sig Dispense Refill  . amLODipine (NORVASC) 10 MG tablet Take 10 mg by mouth at bedtime.     Marland Kitchen aspirin EC 81 MG tablet Take 81 mg by mouth every evening.    . Blood Glucose Monitoring Suppl (ACCU-CHEK AVIVA PLUS) W/DEVICE KIT 1 Device by Does not apply route 2 (two) times daily. 1 kit 3  . furosemide (LASIX) 40 MG tablet Take 40 mg by mouth daily.    Marland Kitchen glucose blood (ACCU-CHEK AVIVA) test strip Use as instructed 100 each 12  . Insulin Glargine (LANTUS) 100 UNIT/ML Solostar Pen Inject 25 Units into the skin daily at 10 pm. 15 mL 11  . Insulin Pen Needle 32G X 4 MM MISC 1 pen by Does not apply route daily. 100 each 11  . Lancets Misc. (ACCU-CHEK FASTCLIX LANCET) KIT 1 Device by Does not apply route 2 (two) times daily. 1 kit 11  . Magnesium 500 MG TABS Take 500 mg by mouth daily.    . metoprolol  tartrate (LOPRESSOR) 12.5 mg TABS tablet Take 0.5 tablets (12.5 mg total) by mouth 2 (two) times daily. 60 tablet 3  . Multiple Vitamin (MULTIVITAMIN WITH MINERALS) TABS tablet Take 1 tablet by mouth daily.    . mycophenolate (CELLCEPT) 250 MG capsule Take 2 capsules (500 mg total) by mouth 2 (two) times daily. 60 capsule 3  . pantoprazole (PROTONIX) 40 MG tablet Take 40 mg by mouth daily.    . potassium chloride (KLOR-CON) 8 MEQ tablet Take 8 mEq by mouth daily.    . pravastatin (PRAVACHOL) 40 MG tablet Take 40 mg by mouth every evening.    . predniSONE (DELTASONE) 5 MG tablet Take 5 mg by mouth daily.    . sodium bicarbonate 650 MG tablet Take 650 mg by mouth as needed for heartburn.    . sulfamethoxazole-trimethoprim (BACTRIM,SEPTRA) 400-80 MG per tablet Take 1 tablet by mouth every Monday, Wednesday, and Friday.    . tacrolimus (PROGRAF) 0.5 MG capsule Take 1 capsule (0.5 mg total) by mouth 2 (two) times daily. Takes total of 4.$RemoveBe'5mg'QUfnMwSGV$  twice daily 60 capsule 3  . tacrolimus (PROGRAF) 1 MG capsule Take 4 capsules (4 mg total) by mouth 2 (two) times daily. Takes 4.$RemoveBefore'5mg'tUcOWYAWkyFCS$  total twice daily 60 capsule 3   No current facility-administered medications on file prior to visit.      Objective:   Physical Exam  Filed Vitals:   06/01/14 1042  BP: 122/65  Pulse: 105  Temp: 98 F (36.7 C)  TempSrc: Oral  Height: $Remove'5\' 8"'IFEiTsE$  (1.727 m)  Weight: 212 lb 8 oz (96.389 kg)  SpO2: 97%   General: Vital signs reviewed.  Patient is well-developed and well-nourished, in no acute distress and cooperative with exam.   Cardiovascular: Tachycardic, regular rhythm, S1 normal, S2 normal, no murmurs, gallops, or rubs. Pulmonary/Chest: Clear to auscultation bilaterally, no wheezes, rales, or rhonchi. Abdominal: Soft, non-tender, non-distended, BS + Extremities: No lower extremity edema bilaterally,  pulses symmetric and intact bilaterally.  Psychiatric: Normal mood and affect. speech and behavior is normal. Cognition and  memory are normal.     Assessment & Plan:   Please see problem based assessment and plan.

## 2014-06-01 NOTE — Assessment & Plan Note (Addendum)
Assessment: TSH 0.06 and Free T4 N at 1.33 while admitted in the hospital. Thought to be sick euthyroid since normal T4 and TSH <0.3, but >0.01. Patient will likely have a normal TSH now that illness has resolved, but she continues to be tachycardic (105) and has weight loss (though likely from not eating during DKA).   Plan: -Recheck TSH -Continue Synthroid 100 mcg daily  Addendum: TSH 1.201, normal

## 2014-06-01 NOTE — Assessment & Plan Note (Signed)
Assessment: Platelets trended 283>192>124>133 in the hospital. No use of heparin. No evidence or signs of bleeding since discharge including melena, hematochezia or bleeding from gums.  Plan: -Recheck CBC w Diff

## 2014-06-02 LAB — MICROALBUMIN / CREATININE URINE RATIO
Creatinine, Urine: 141.6 mg/dL
MICROALB UR: 5.9 mg/dL — AB (ref <2.0)
MICROALB/CREAT RATIO: 41.7 mg/g — AB (ref 0.0–30.0)

## 2014-06-02 LAB — GLUCOSE, CAPILLARY: GLUCOSE-CAPILLARY: 428 mg/dL — AB (ref 70–99)

## 2014-06-04 ENCOUNTER — Other Ambulatory Visit: Payer: Self-pay | Admitting: Dietician

## 2014-06-04 DIAGNOSIS — E1143 Type 2 diabetes mellitus with diabetic autonomic (poly)neuropathy: Secondary | ICD-10-CM

## 2014-06-04 MED ORDER — ACCU-CHEK SOFTCLIX LANCETS MISC: Status: DC

## 2014-06-04 NOTE — Telephone Encounter (Signed)
Got incorrect lancets for her meter that do not fit

## 2014-06-07 ENCOUNTER — Telehealth: Payer: Self-pay | Admitting: Dietician

## 2014-06-07 NOTE — Telephone Encounter (Addendum)
Received phone call from patient. Got Fastclick lancet and needed Softclix. Paid cash for the softclix. Will be here tomorrow for her appointment and bring the fastclix

## 2014-06-08 ENCOUNTER — Other Ambulatory Visit: Payer: Self-pay | Admitting: Internal Medicine

## 2014-06-08 ENCOUNTER — Ambulatory Visit (INDEPENDENT_AMBULATORY_CARE_PROVIDER_SITE_OTHER): Payer: Medicare Other | Admitting: Internal Medicine

## 2014-06-08 ENCOUNTER — Encounter: Payer: Self-pay | Admitting: Internal Medicine

## 2014-06-08 ENCOUNTER — Encounter: Payer: Self-pay | Admitting: Dietician

## 2014-06-08 ENCOUNTER — Ambulatory Visit (INDEPENDENT_AMBULATORY_CARE_PROVIDER_SITE_OTHER): Payer: Medicare Other | Admitting: Dietician

## 2014-06-08 VITALS — BP 100/62 | HR 87 | Temp 98.3°F | Wt 214.6 lb

## 2014-06-08 DIAGNOSIS — E1143 Type 2 diabetes mellitus with diabetic autonomic (poly)neuropathy: Secondary | ICD-10-CM

## 2014-06-08 DIAGNOSIS — I10 Essential (primary) hypertension: Secondary | ICD-10-CM | POA: Diagnosis not present

## 2014-06-08 DIAGNOSIS — E785 Hyperlipidemia, unspecified: Secondary | ICD-10-CM

## 2014-06-08 DIAGNOSIS — Z0000 Encounter for general adult medical examination without abnormal findings: Secondary | ICD-10-CM | POA: Diagnosis not present

## 2014-06-08 DIAGNOSIS — E039 Hypothyroidism, unspecified: Secondary | ICD-10-CM | POA: Diagnosis not present

## 2014-06-08 DIAGNOSIS — D696 Thrombocytopenia, unspecified: Secondary | ICD-10-CM

## 2014-06-08 DIAGNOSIS — Z940 Kidney transplant status: Secondary | ICD-10-CM | POA: Diagnosis not present

## 2014-06-08 LAB — GLUCOSE, CAPILLARY: GLUCOSE-CAPILLARY: 246 mg/dL — AB (ref 70–99)

## 2014-06-08 MED ORDER — PRAVASTATIN SODIUM 10 MG PO TABS: 10.0000 mg | ORAL_TABLET | Freq: Every evening | ORAL | Status: DC

## 2014-06-08 MED ORDER — INSULIN ASPART 100 UNIT/ML FLEXPEN: 4.0000 [IU] | PEN_INJECTOR | Freq: Every day | SUBCUTANEOUS | Status: DC

## 2014-06-08 MED ORDER — INSULIN PEN NEEDLE 32G X 4 MM MISC: 1.0000 "pen " | Freq: Two times a day (BID) | Status: DC

## 2014-06-08 MED ORDER — LISINOPRIL 2.5 MG PO TABS: 2.5000 mg | ORAL_TABLET | Freq: Every day | ORAL | Status: DC

## 2014-06-08 MED ORDER — INSULIN GLARGINE 100 UNIT/ML SOLOSTAR PEN: 35.0000 [IU] | PEN_INJECTOR | Freq: Every day | SUBCUTANEOUS | Status: DC

## 2014-06-08 NOTE — Assessment & Plan Note (Signed)
-  Continue Synthroid 100 mcg QD

## 2014-06-08 NOTE — Progress Notes (Signed)
  Medical Nutrition Therapy:  Appt start time: 1030 end time:  1100.  Assessment:  Primary concerns today: blood sugar control.  [name] has had diabetes for a while, but denies formal education. Is new to insulin and is also s/p renal transplant. She is interested in knowing what to eat and how she can prevent weight gain. Her son assists her with cooking. She began trying to eat 3 meals a day recently. Complains of polydipsia.  Preferred Learning Style: No preference indicated  Learning Readiness: Ready, Change in progress MEDICATIONS: prednisone, magnesium, prograf BLOOD SUGARS:  240s to 500s on 32 units lantus a day DIETARY INTAKE: Usual eating pattern includes 3 meals and 1-3 snacks per day. Everyday foods include eggs, bacon, bread, fruit and vegetables.  Avoided foods include dark and regular soda.   24-hr recall:  B ( AM): used to skip breakfast but has been eating since her last hospitalization: 1 egg, 1 toast, 2 slices bacon, coffee with milk, sometimes grits  L ( PM): biggest meal- usually meat, vegetables, starch, today will be chili and rice Snk ( PM): granny smith apple D ( PM): thinking about having Snk ( PM): non specific about this today Beverages: water and diet soda  Usual physical activity: not discussed today   Progress Towards Goal(s):  In progress.   Nutritional Diagnosis:  NB-1.1 Food and nutrition-related knowledge deficit As related to lack of prior exposure to diabetes meal planning.  As evidenced by her report and questions.    Intervention:  Nutrition education about what foods to eat and began general discussion of portion sizes.  Teaching Method Utilized: Visual using picture and large print, Auditory Handouts given during visit include: What to eat from learning about diabetes Barriers to learning/adherence to lifestyle change: patient reads without problem, has difficulty seeing. Son is currently supporting her. Demonstrated degree of understanding  via:  Teach Back   Monitoring/Evaluation:  Dietary intake, exercise, meter, and body weight in 3 week(s).

## 2014-06-08 NOTE — Patient Instructions (Signed)
General Instructions:   Thank you for bringing your medicines today. This helps [place] keep you safe from mistakes.    MEDICATION CHANGES:  1. STOP TAKING AMLODIPINE.  2. START TAKING LISINOPRIL 2.5 MG 1 PILL ONCE A DAY  3. INCREASE LANTUS TO 35 UNITS AT NIGHT  4. START TAKING NOVOLOG (SHORT ACTING INSULIN) 4 UNITS WITH LUNCH  5. CONTINUE TAKING ALL OF YOUR OTHER MEDICATIONS AS PRESCRIBED  6. FOLLOW UP IN 1-2 WEEKS  7. CHECK YOUR BLOOD SUGAR 3-4 TIMES PER DAY (IN THE MORNING, BEFORE LUNCH, BEFORE DINNER, AT BEDTIME)

## 2014-06-08 NOTE — Assessment & Plan Note (Signed)
Platelets normal at 273. Resolved.

## 2014-06-08 NOTE — Assessment & Plan Note (Signed)
Lab Results  Component Value Date   HGBA1C 15.0* 05/14/2014   HGBA1C 7.3 05/08/2013     Assessment: Diabetes control:  Uncontrolled Progress toward A1C goal:   no progress Comments: Patient has been taking Lantus 32 units at night with little improvement in glucose readings. Glucose fasting is in upper 200s-300s. Glucose has ranged 262 to 550. 246 today in clinic (fasting).   Plan: Medications:  Increase Lantus to 35 units QHS. Add Novolog 4 units with meals. Home glucose monitoring: Frequency:  4 times daily Timing:  Before breakfast, lunch, dinner and before bedtime Instruction/counseling given: reminded to bring blood glucose meter & log to each visit, reminded to bring medications to each visit, discussed the need for weight loss, discussed diet, provided printed educational material and other instruction/counseling: met with Donna Other plans: Follow up in 1-2 weeks. Patient will likely need to be increased again or add prandial insulin to breakfast and/or dinner.    

## 2014-06-08 NOTE — Progress Notes (Signed)
Subjective:    Patient ID: [name], female    DOB: [birth year], 68 y.o.   MRN: [id number]  HPI [name] is a 68 yo female with PMHx of T2DM, HTN, h/o transplant and thrombocytopenia who presents to the clinic for diabetes follow up. Please see problem oriented assessment and plan for more information.  Review of Systems General: Denies fever, chills, fatigue, change in appetite and diaphoresis.  Respiratory: Denies SOB, cough   Cardiovascular: Denies chest pain and palpitations.  Gastrointestinal: Denies nausea, vomiting, abdominal pain Genitourinary: Denies dysuria, urgency, frequency Musculoskeletal: Admits to myalgias. Denies back pain, joint swelling, arthralgias and gait problem.  Neurological: Admits to lightheadedness. Denies headaches, weakness, numbness, seizures, and syncope. Psychiatric/Behavioral: Denies mood changes, confusion, nervousness, sleep disturbance and agitation.  Past Medical History  Diagnosis Date  . Diabetes mellitus   . Hypertension   . Renal disorder   . Morbid obesity     BMI 34  . Allergy   . Blood transfusion without reported diagnosis   . Cataract   . Clotting disorder   . Thyroid disease   . Ulcer    Outpatient Encounter Prescriptions as of 06/08/2014  Medication Sig  . ACCU-CHEK SOFTCLIX LANCETS lancets Check blood sugar up to 3 times a day  as instructed  . amLODipine (NORVASC) 10 MG tablet Take 1 tablet (10 mg total) by mouth at bedtime.  Marland Kitchen aspirin EC 81 MG tablet Take 1 tablet (81 mg total) by mouth every evening.  . Blood Glucose Monitoring Suppl (ACCU-CHEK AVIVA PLUS) W/DEVICE KIT 1 Device by Does not apply route 2 (two) times daily.  . furosemide (LASIX) 40 MG tablet Take 1 tablet (40 mg total) by mouth daily.  Marland Kitchen glucose blood (ACCU-CHEK AVIVA) test strip Use as instructed  . Insulin Glargine (LANTUS) 100 UNIT/ML Solostar Pen Inject 32 Units into the skin daily at 10 pm.  . Insulin Pen Needle 32G X 4 MM MISC 1 pen by Does  not apply route daily.  . Magnesium 500 MG TABS Take 500 mg by mouth daily.  . metoprolol tartrate (LOPRESSOR) 12.5 mg TABS tablet Take 0.5 tablets (12.5 mg total) by mouth 2 (two) times daily.  . Multiple Vitamin (MULTIVITAMIN WITH MINERALS) TABS tablet Take 1 tablet by mouth daily.  . mycophenolate (CELLCEPT) 250 MG capsule Take 2 capsules (500 mg total) by mouth 2 (two) times daily.  . pantoprazole (PROTONIX) 40 MG tablet Take 1 tablet (40 mg total) by mouth daily.  . potassium chloride (KLOR-CON) 8 MEQ tablet Take 8 mEq by mouth daily.  . pravastatin (PRAVACHOL) 40 MG tablet Take 1 tablet (40 mg total) by mouth every evening.  . predniSONE (DELTASONE) 5 MG tablet Take 5 mg by mouth daily.  . sodium bicarbonate 650 MG tablet Take 650 mg by mouth as needed for heartburn.  . sulfamethoxazole-trimethoprim (BACTRIM,SEPTRA) 400-80 MG per tablet Take 1 tablet by mouth every Monday, Wednesday, and Friday.  . tacrolimus (PROGRAF) 0.5 MG capsule Take 1 capsule (0.5 mg total) by mouth 2 (two) times daily. Takes total of 4.47m twice daily  . tacrolimus (PROGRAF) 1 MG capsule Take 4 capsules (4 mg total) by mouth 2 (two) times daily. Takes 4.566mtotal twice daily      Objective:   Physical Exam Filed Vitals:   06/08/14 0918  BP: 100/62  Pulse: 87  Temp: 98.3 F (36.8 C)  TempSrc: Oral  Weight: 214 lb 9.6 oz (97.342 kg)  SpO2: 98%   General: Vital  signs reviewed.  Patient is well-developed and well-nourished, in no acute distress and cooperative with exam.  Cardiovascular: RRR, S1 normal, S2 normal, no murmurs, gallops, or rubs. Pulmonary/Chest: Clear to auscultation bilaterally, no wheezes, rales, or rhonchi. Abdominal: Soft, non-tender, non-distended, BS +  Musculoskeletal: No joint deformities, erythema, or stiffness, ROM full and nontender. Extremities: No lower extremity edema bilaterallySkin: Warm, dry and intact. No rashes or erythema. Psychiatric: Normal mood and affect. speech and  behavior is normal. Cognition and memory are normal.     Assessment & Plan:   Please see problem based assessment and plan.

## 2014-06-08 NOTE — Assessment & Plan Note (Signed)
BP Readings from Last 3 Encounters:  06/08/14 100/62  06/01/14 122/65  05/20/14 125/64    Lab Results  Component Value Date   NA 131* 06/01/2014   K 4.7 06/01/2014   CREATININE 1.34* 06/01/2014    Assessment: Blood pressure control:  Controlled Progress toward BP goal:   Too controlled (low) Comments: patient feels dizzy when she stands up. Should be on ACEI for DM.  Plan: Medications:  STOP amlodipine 10 mg daily. START lisinopril 2.5 mg daily Continue metoprolol 12.5 mg BID Other plans: Return in 1-2 weeks for BP recheck

## 2014-06-08 NOTE — Assessment & Plan Note (Signed)
Patient was incorrectly put on pravastatin 40 mg daily when admitted to hospital. Patient should only be on 10 mg daily. Reorder pravastatin 10 mg daily.

## 2014-06-08 NOTE — Assessment & Plan Note (Signed)
-  Kidney function stable, follow up with Dr. Franchot Gallo on 06/23/14.

## 2014-06-10 NOTE — Progress Notes (Signed)
Internal Medicine Clinic Attending  I saw and evaluated the patient.  I personally confirmed the key portions of the history and exam documented by Dr. Richardson and I reviewed pertinent patient test results.  The assessment, diagnosis, and plan were formulated together and I agree with the documentation in the resident's note. 

## 2014-06-22 ENCOUNTER — Encounter: Payer: Self-pay | Admitting: Internal Medicine

## 2014-06-22 ENCOUNTER — Ambulatory Visit (INDEPENDENT_AMBULATORY_CARE_PROVIDER_SITE_OTHER): Payer: Medicare Other | Admitting: Internal Medicine

## 2014-06-22 ENCOUNTER — Ambulatory Visit (INDEPENDENT_AMBULATORY_CARE_PROVIDER_SITE_OTHER): Payer: Medicare Other | Admitting: *Deleted

## 2014-06-22 VITALS — BP 108/71 | HR 88 | Temp 97.4°F | Ht 68.0 in | Wt 214.5 lb

## 2014-06-22 DIAGNOSIS — Z0000 Encounter for general adult medical examination without abnormal findings: Secondary | ICD-10-CM | POA: Diagnosis not present

## 2014-06-22 DIAGNOSIS — E1143 Type 2 diabetes mellitus with diabetic autonomic (poly)neuropathy: Secondary | ICD-10-CM

## 2014-06-22 DIAGNOSIS — Z23 Encounter for immunization: Secondary | ICD-10-CM | POA: Diagnosis not present

## 2014-06-22 DIAGNOSIS — K029 Dental caries, unspecified: Secondary | ICD-10-CM

## 2014-06-22 DIAGNOSIS — I10 Essential (primary) hypertension: Secondary | ICD-10-CM

## 2014-06-22 LAB — GLUCOSE, CAPILLARY: Glucose-Capillary: 255 mg/dL — ABNORMAL HIGH (ref 70–99)

## 2014-06-22 MED ORDER — INSULIN GLARGINE 100 UNIT/ML SOLOSTAR PEN: 40.0000 [IU] | PEN_INJECTOR | Freq: Every day | SUBCUTANEOUS | Status: DC

## 2014-06-22 MED ORDER — INSULIN ASPART 100 UNIT/ML FLEXPEN: PEN_INJECTOR | SUBCUTANEOUS | Status: DC

## 2014-06-22 NOTE — Patient Instructions (Signed)
General Instructions: -Stop taking lisinopril.  -Start injecting into the skin the Novolog insulin, 4 units with breakfast, 6 units with lunch, 4 units with dinner.  -Continue checking your blood sugar at least 3 times per day, before meals and at bedtime.  -Follow up in 2-3 weeks for diabetes care. Please bring your meter and your log book with you.   Happy New Year!  Thank you for bringing your medicines today. This helps [place] keep you safe from mistakes.   Progress Toward Treatment Goals:  No flowsheet data found.  Self Care Goals & Plans:  Self Care Goal 06/22/2014  Manage my medications take my medicines as prescribed; bring my medications to every visit; refill my medications on time  Monitor my health keep track of my blood glucose; bring my glucose meter and log to each visit  Eat healthy foods drink diet soda or water instead of juice or soda; eat more vegetables; eat foods that are low in salt; eat baked foods instead of fried foods    No flowsheet data found.   Care Management & Community Referrals:  No flowsheet data found.

## 2014-06-24 DIAGNOSIS — E785 Hyperlipidemia, unspecified: Secondary | ICD-10-CM | POA: Diagnosis not present

## 2014-06-24 DIAGNOSIS — E039 Hypothyroidism, unspecified: Secondary | ICD-10-CM | POA: Diagnosis not present

## 2014-06-24 DIAGNOSIS — E876 Hypokalemia: Secondary | ICD-10-CM | POA: Diagnosis not present

## 2014-06-24 DIAGNOSIS — K219 Gastro-esophageal reflux disease without esophagitis: Secondary | ICD-10-CM | POA: Diagnosis not present

## 2014-06-24 DIAGNOSIS — Z940 Kidney transplant status: Secondary | ICD-10-CM | POA: Diagnosis not present

## 2014-06-24 DIAGNOSIS — I82409 Acute embolism and thrombosis of unspecified deep veins of unspecified lower extremity: Secondary | ICD-10-CM | POA: Diagnosis not present

## 2014-06-24 DIAGNOSIS — E139 Other specified diabetes mellitus without complications: Secondary | ICD-10-CM | POA: Diagnosis not present

## 2014-06-24 DIAGNOSIS — Z79899 Other long term (current) drug therapy: Secondary | ICD-10-CM | POA: Diagnosis not present

## 2014-06-24 DIAGNOSIS — N186 End stage renal disease: Secondary | ICD-10-CM | POA: Diagnosis not present

## 2014-06-24 DIAGNOSIS — K029 Dental caries, unspecified: Secondary | ICD-10-CM | POA: Insufficient documentation

## 2014-06-24 DIAGNOSIS — I129 Hypertensive chronic kidney disease with stage 1 through stage 4 chronic kidney disease, or unspecified chronic kidney disease: Secondary | ICD-10-CM | POA: Diagnosis not present

## 2014-06-24 NOTE — Progress Notes (Signed)
   Subjective:    Patient ID: [name], female    DOB: [birth year], 68 y.o.   MRN: [id number]  HPI [name] is a 68 yr old woman with PMH of DM2, ESRD s/p kidney transplant, presenting for follow up visit for her diabetes. In addition she complains of a dry cough while taking lisinopril.    Review of Systems  Constitutional: Negative for fever, chills, diaphoresis, activity change, appetite change, fatigue and unexpected weight change.  HENT: Negative for congestion and postnasal drip.   Respiratory: Positive for cough. Negative for shortness of breath and wheezing.   Cardiovascular: Negative for chest pain, palpitations and leg swelling.  Gastrointestinal: Negative for abdominal pain.  Neurological: Negative for dizziness and light-headedness.  Psychiatric/Behavioral: Negative for agitation.       Objective:   Physical Exam  Constitutional: She is oriented to person, place, and time. She appears well-developed and well-nourished. No distress.  HENT:  Several missing teeth  Cardiovascular: Normal rate.   Pulmonary/Chest: Effort normal. No respiratory distress. She has no wheezes. She has no rales.  Musculoskeletal: She exhibits no edema.  Neurological: She is alert and oriented to person, place, and time. Coordination normal.  Skin: Skin is warm and dry. No rash noted. She is not diaphoretic.  Psychiatric: She has a normal mood and affect.  Nursing note and vitals reviewed.         Assessment & Plan:

## 2014-06-24 NOTE — Assessment & Plan Note (Signed)
Received the flu vaccine during this visit.  She is due for Prevnar 13, will get it during her next visit.  Referred for eye exam.  She is due for mammogram, DEXA, and colonoscopy, will need these addressed during her next visit.

## 2014-06-24 NOTE — Assessment & Plan Note (Signed)
Has several missing teeth, caries, no dental pain/abscess.  Referred to dentist per her request

## 2014-06-24 NOTE — Assessment & Plan Note (Signed)
BP Readings from Last 3 Encounters:  06/22/14 108/71  06/08/14 100/62  06/01/14 122/65    Lab Results  Component Value Date   NA 131* 06/01/2014   K 4.7 06/01/2014   CREATININE 1.34* 06/01/2014    Assessment: Blood pressure control:  controlled, too low, has had intermittent dizziness last month Progress toward BP goal:   at goal Comments: Her Nephrologist increased her lisinopril to 5mg  daily for BP (not for proteinuria given that the pt is s/p renal transplant)  Plan: Medications:  discontinue lisinopril, continue lasix 40mg  daily Educational resources provided: brochure (has information) Self management tools provided:   Other plans: Follow up in 2 weeks.

## 2014-06-24 NOTE — Progress Notes (Signed)
Medicine attending: Medical history, presenting problems, physical findings, and medications, reviewed with Dr Kennerly on the day of the patient encounter and I concur with her evaluation and management plan. 

## 2014-06-24 NOTE — Assessment & Plan Note (Signed)
Lab Results  Component Value Date   HGBA1C 15.0* 05/14/2014   HGBA1C 7.3 05/08/2013     Assessment: Diabetes control:  not controlled Progress toward A1C goal:   not at goal Comments: She was on Lantus 35 units daily and Novolog 6 units with lunch. She had her CBG meter with persistently high glucose before breakfast (200s) and before bedtime (to 400s)  Plan: Medications:  will increase lantus to 40 units qhs, add Novolog 4 units with breakfast and with dinner.  Home glucose monitoring: Frequency:   Timing:   Instruction/counseling given: reminded to get eye exam, reminded to bring blood glucose meter & log to each visit, reminded to bring medications to each visit and discussed diet Educational resources provided:  (has information) Self management tools provided: copy of home glucose meter download Other plans: Follow up in 2 weeks.

## 2014-07-21 ENCOUNTER — Ambulatory Visit (INDEPENDENT_AMBULATORY_CARE_PROVIDER_SITE_OTHER): Payer: Medicare Other | Admitting: Internal Medicine

## 2014-07-21 ENCOUNTER — Encounter: Payer: Self-pay | Admitting: Internal Medicine

## 2014-07-21 VITALS — BP 126/70 | HR 105 | Temp 98.0°F | Ht 68.0 in | Wt 218.2 lb

## 2014-07-21 DIAGNOSIS — E1143 Type 2 diabetes mellitus with diabetic autonomic (poly)neuropathy: Secondary | ICD-10-CM

## 2014-07-21 DIAGNOSIS — I10 Essential (primary) hypertension: Secondary | ICD-10-CM | POA: Diagnosis not present

## 2014-07-21 DIAGNOSIS — K219 Gastro-esophageal reflux disease without esophagitis: Secondary | ICD-10-CM | POA: Diagnosis not present

## 2014-07-21 DIAGNOSIS — Z1231 Encounter for screening mammogram for malignant neoplasm of breast: Secondary | ICD-10-CM

## 2014-07-21 DIAGNOSIS — Z23 Encounter for immunization: Secondary | ICD-10-CM

## 2014-07-21 DIAGNOSIS — E039 Hypothyroidism, unspecified: Secondary | ICD-10-CM | POA: Diagnosis not present

## 2014-07-21 DIAGNOSIS — Z0000 Encounter for general adult medical examination without abnormal findings: Secondary | ICD-10-CM

## 2014-07-21 DIAGNOSIS — Z940 Kidney transplant status: Secondary | ICD-10-CM | POA: Diagnosis not present

## 2014-07-21 LAB — GLUCOSE, CAPILLARY: Glucose-Capillary: 78 mg/dL (ref 70–99)

## 2014-07-21 MED ORDER — LEVOTHYROXINE SODIUM 100 MCG PO TABS: 100.0000 ug | ORAL_TABLET | Freq: Every day | ORAL | Status: DC

## 2014-07-21 MED ORDER — PRAVASTATIN SODIUM 10 MG PO TABS: 10.0000 mg | ORAL_TABLET | Freq: Every evening | ORAL | Status: DC

## 2014-07-21 MED ORDER — PANTOPRAZOLE SODIUM 40 MG PO TBEC
40.0000 mg | DELAYED_RELEASE_TABLET | Freq: Every day | ORAL | Status: DC
Start: 2014-07-21 — End: 2015-05-11

## 2014-07-21 NOTE — Progress Notes (Signed)
Subjective:    Patient ID: CHRISTLE NOLTING, female    DOB: 1945/07/11, 69 y.o.   MRN: [id number]  HPI Ms. Cozzolino is a 69 yo female with PMHx of T2DM, HTN, renal transplant, and hypothyroidism who presents for follow up. Please see problem based assessment and plan for more information.  Review of Systems General: Admits to weight gain. Denies fever, chills, fatigue  Respiratory: Denies SOB, cough Cardiovascular: Denies chest pain and palpitations.  Gastrointestinal: Denies nausea, vomiting, abdominal pain Genitourinary: Denies dysuria, urgency, frequency Neurological: Denies dizziness, headaches, weakness, lightheadedness  Past Medical History  Diagnosis Date  . Diabetes mellitus   . Hypertension   . Renal disorder   . Morbid obesity     BMI 34  . Allergy   . Blood transfusion without reported diagnosis   . Cataract   . Clotting disorder   . Thyroid disease   . Ulcer    Outpatient Encounter Prescriptions as of 07/21/2014  Medication Sig  . ACCU-CHEK SOFTCLIX LANCETS lancets Check blood sugar up to 3 times a day  as instructed  . aspirin EC 81 MG tablet Take 1 tablet (81 mg total) by mouth every evening.  . Blood Glucose Monitoring Suppl (ACCU-CHEK AVIVA PLUS) W/DEVICE KIT 1 Device by Does not apply route 2 (two) times daily.  . furosemide (LASIX) 40 MG tablet Take 1 tablet (40 mg total) by mouth daily.  Marland Kitchen glucose blood (ACCU-CHEK AVIVA) test strip Use as instructed  . insulin aspart (NOVOLOG FLEXPEN) 100 UNIT/ML FlexPen Inject into the skin 4 units with breakfast, 6 units with lunch, and 4 units with dinner.  . Insulin Glargine (LANTUS) 100 UNIT/ML Solostar Pen Inject 40 Units into the skin daily at 10 pm.  . Insulin Pen Needle 32G X 4 MM MISC 1 pen by Does not apply route 2 (two) times daily.  . Magnesium 500 MG TABS Take 500 mg by mouth daily.  . Multiple Vitamin (MULTIVITAMIN WITH MINERALS) TABS tablet Take 1 tablet by mouth daily.  . mycophenolate (CELLCEPT) 250 MG  capsule Take 2 capsules (500 mg total) by mouth 2 (two) times daily.  . pantoprazole (PROTONIX) 40 MG tablet Take 1 tablet (40 mg total) by mouth daily.  . potassium chloride (KLOR-CON) 8 MEQ tablet Take 8 mEq by mouth daily.  . pravastatin (PRAVACHOL) 10 MG tablet Take 1 tablet (10 mg total) by mouth every evening.  . predniSONE (DELTASONE) 5 MG tablet Take 5 mg by mouth daily.  . sodium bicarbonate 650 MG tablet Take 650 mg by mouth as needed for heartburn.  . sulfamethoxazole-trimethoprim (BACTRIM,SEPTRA) 400-80 MG per tablet Take 1 tablet by mouth every Monday, Wednesday, and Friday.  . tacrolimus (PROGRAF) 0.5 MG capsule Take 1 capsule (0.5 mg total) by mouth 2 (two) times daily. Takes total of 4.$RemoveBe'5mg'yqSOpbQEv$  twice daily  . tacrolimus (PROGRAF) 1 MG capsule Take 4 capsules (4 mg total) by mouth 2 (two) times daily. Takes 4.$RemoveBefore'5mg'fuLSltAwQJlnh$  total twice daily      Objective:   Physical Exam Filed Vitals:   07/21/14 1454  BP: 126/70  Pulse: 105  Temp: 98 F (36.7 C)  TempSrc: Oral  Height: $Remove'5\' 8"'TAtbpZN$  (1.727 m)  Weight: 218 lb 3.2 oz (98.975 kg)  SpO2: 99%   General: Vital signs reviewed.  Patient is well-developed and well-nourished, in no acute distress and cooperative with exam.  Neck: No carotid bruit present.  Cardiovascular: RRR Pulmonary/Chest: Clear to auscultation bilaterally, no wheezes, rales, or rhonchi. Abdominal: Soft, non-tender, non-distended,  BS + Extremities: No lower extremity edema bilaterally Neurological: A&O x3 Skin: Warm, dry and intact.  Psychiatric: Normal mood and affect.     Assessment & Plan:   Please see problem based assessment and plan.

## 2014-07-21 NOTE — Assessment & Plan Note (Signed)
Received Prevnar13 shot today.  Referred for Mammogram.  Scheduled for pelvic exam with me in 1-2 months.   Ophthalmology appt in February.   Patient needs colonoscopy and DEXA scan-will address at next visit given number of referrals currently in process.

## 2014-07-21 NOTE — Assessment & Plan Note (Signed)
BP Readings from Last 3 Encounters:  07/21/14 126/70  06/22/14 108/71  06/08/14 100/62    Lab Results  Component Value Date   NA 131* 06/01/2014   K 4.7 06/01/2014   CREATININE 1.34* 06/01/2014    Assessment: Blood pressure control:  Controlled Progress toward BP goal:   At goal Comments: Patient taken off of lisinopril last visit due to cough and light-headness when standing. BP was low normal at that time. Patient is not currently on any BP meds except lasix 40 mg daily and her BP is well controlled. Patient does have mild proteinuria which could be secondary to renal transplant.   Plan: Medications:  continue current medications Other plans: At next visit, we will consider recheck urine microalbumin/creatinine. If >30 than we will consider adding low dose ARB. But first, we will discuss this with Dr. Franchot Gallo, patient's nephrologist.

## 2014-07-21 NOTE — Patient Instructions (Signed)
General Instructions:   Thank you for bringing your medicines today. This helps [place] keep you safe from mistakes.  KEEP TAKING ALL OF YOUR MEDICATIONS AS PRESCRIBED. YOU ARE DOING A GREAT JOB!  PLEASE FOLLOW UP IN ONE MONTH FOR DIABETES AND PELVIC EXAM.   WE WILL REFER YOU FOR A MAMMOGRAM.  Basic Carbohydrate Counting for Diabetes Mellitus Carbohydrate counting is a method for keeping track of the amount of carbohydrates you eat. Eating carbohydrates naturally increases the level of sugar (glucose) in your blood, so it is important for you to know the amount that is okay for you to have in every meal. Carbohydrate counting helps keep the level of glucose in your blood within normal limits. The amount of carbohydrates allowed is different for every person. A dietitian can help you calculate the amount that is right for you. Once you know the amount of carbohydrates you can have, you can count the carbohydrates in the foods you want to eat. Carbohydrates are found in the following foods:  Grains, such as breads and cereals.  Dried beans and soy products.  Starchy vegetables, such as potatoes, peas, and corn.  Fruit and fruit juices.  Milk and yogurt.  Sweets and snack foods, such as cake, cookies, candy, chips, soft drinks, and fruit drinks. CARBOHYDRATE COUNTING There are two ways to count the carbohydrates in your food. You can use either of the methods or a combination of both. Reading the "Nutrition Facts" on [place] The "Nutrition Facts" is an area that is included on the labels of almost all packaged food and beverages in the [place]. It includes the serving size of that food or beverage and information about the nutrients in each serving of the food, including the grams (g) of carbohydrate per serving.  Decide the number of servings of this food or beverage that you will be able to eat or drink. Multiply that number of servings by the number of grams of carbohydrate that  is listed on the label for that serving. The total will be the amount of carbohydrates you will be having when you eat or drink this food or beverage. Learning Standard Serving Sizes of Food When you eat food that is not packaged or does not include "Nutrition Facts" on the label, you need to measure the servings in order to count the amount of carbohydrates.A serving of most carbohydrate-rich foods contains about 15 g of carbohydrates. The following list includes serving sizes of carbohydrate-rich foods that provide 15 g ofcarbohydrate per serving:   1 slice of bread (1 oz) or 1 six-inch tortilla.    of a hamburger bun or English muffin.  4-6 crackers.   cup unsweetened dry cereal.    cup hot cereal.   cup rice or pasta.    cup mashed potatoes or  of a large baked potato.  1 cup fresh fruit or one small piece of fruit.    cup canned or frozen fruit or fruit juice.  1 cup milk.   cup plain fat-free yogurt or yogurt sweetened with artificial sweeteners.   cup cooked dried beans or starchy vegetable, such as peas, corn, or potatoes.  Decide the number of standard-size servings that you will eat. Multiply that number of servings by 15 (the grams of carbohydrates in that serving). For example, if you eat 2 cups of strawberries, you will have eaten 2 servings and 30 g of carbohydrates (2 servings x 15 g = 30 g). For foods such as soups  and casseroles, in which more than one food is mixed in, you will need to count the carbohydrates in each food that is included. EXAMPLE OF CARBOHYDRATE COUNTING Sample Dinner  3 oz chicken breast.   cup of brown rice.   cup of corn.  1 cup milk.   1 cup strawberries with sugar-free whipped topping.  Carbohydrate Calculation Step 1: Identify the foods that contain carbohydrates:   Rice.   Corn.   Milk.   Strawberries. Step 2:Calculate the number of servings eaten of each:   2 servings of rice.   1 serving of  corn.   1 serving of milk.   1 serving of strawberries. Step 3: Multiply each of those number of servings by 15 g:   2 servings of rice x 15 g = 30 g.   1 serving of corn x 15 g = 15 g.   1 serving of milk x 15 g = 15 g.   1 serving of strawberries x 15 g = 15 g. Step 4: Add together all of the amounts to find the total grams of carbohydrates eaten: 30 g + 15 g + 15 g + 15 g = 75 g. Document Released: 06/11/2005 Document Revised: 10/26/2013 Document Reviewed: 05/08/2013 [facility] Patient Information 2015 [place], [place]. This information is not intended to replace advice given to you by your health care provider. Make sure you discuss any questions you have with your health care provider.

## 2014-07-21 NOTE — Assessment & Plan Note (Signed)
Lab Results  Component Value Date   HGBA1C 15.0* 05/14/2014   HGBA1C 7.3 05/08/2013     Assessment: Diabetes control:  Uncontrolled, but improving Progress toward A1C goal:   Improving Comments: Patient's glucose measurements are greatly improved over the last month on regimen of Novolog 4 units before breakfast, 6 units before lunch, 4 units before dinner and Lantus 40 units QHS. CBGs ranging 70s-110s in the morning before breakfast, 90s-140s before lunch, 120s-140s before dinner and 120s-260s before bedtime. This is greatly improved from prior. No hypoglycemic episodes. Patient has been working on her diet as well.   Plan: Medications:  continue current medications Home glucose monitoring: Frequency:  4 times daily Timing:  AC/HS Instruction/counseling given: reminded to bring blood glucose meter & log to each visit, reminded to bring medications to each visit, discussed the need for weight loss and discussed diet Other plans: Patient will return in 1-2 months for follow up appointment and hemoglobin a1c recheck

## 2014-07-21 NOTE — Assessment & Plan Note (Signed)
Patient follows with Dr. Franchot Gallo. Last seen 12/31. Doing well. I have updated medications in Epic per Dr. Collene Leyden most recent AVS.

## 2014-07-23 ENCOUNTER — Telehealth: Payer: Self-pay | Admitting: *Deleted

## 2014-07-23 ENCOUNTER — Other Ambulatory Visit: Payer: Self-pay | Admitting: *Deleted

## 2014-07-23 NOTE — Telephone Encounter (Signed)
Pt left message requesting to change her mammogram appt to March and have it coordinated with Gyn exam the same [name]. Currently she is scheduled for mammo on 08/02/14. I cancelled mammo appt for pt and will reschedule once her Gyn appt has been scheduled. I called pt to tell her this and there was no answer - unable to leave a message. Pt will be contacted next week.

## 2014-07-26 ENCOUNTER — Other Ambulatory Visit: Payer: Self-pay | Admitting: *Deleted

## 2014-07-26 ENCOUNTER — Telehealth: Payer: Self-pay | Admitting: *Deleted

## 2014-07-26 DIAGNOSIS — E1143 Type 2 diabetes mellitus with diabetic autonomic (poly)neuropathy: Secondary | ICD-10-CM

## 2014-07-26 MED ORDER — INSULIN PEN NEEDLE 32G X 4 MM MISC: Status: DC

## 2014-07-26 MED ORDER — GLUCOSE BLOOD VI STRP: ORAL_STRIP | Status: DC

## 2014-07-26 NOTE — Telephone Encounter (Signed)
Returned pt's call - stated she has Dentist's appt on 3/3 w/Dr Shelor [phone]).; Vascular appt w/Dr Truman Hayward [phone]) @ [facility] on 3/11; PAP and mammogram appts on 3/16 - P[facility] w/Dr Hul[facility]; GI appt @ [facility] [phone]) w/Dr. Carlton Adam on 3/17. Thanks

## 2014-07-26 NOTE — Telephone Encounter (Signed)
Thank you :)

## 2014-07-27 NOTE — Progress Notes (Signed)
Internal Medicine Clinic Attending  Case discussed with Dr. Richardson at the time of the visit.  We reviewed the resident's history and exam and pertinent patient test results.  I agree with the assessment, diagnosis, and plan of care documented in the resident's note. 

## 2014-07-28 ENCOUNTER — Telehealth: Payer: Self-pay | Admitting: Dietician

## 2014-07-28 NOTE — Telephone Encounter (Signed)
Having troule getting pen needles and strips in the proper quantities to care for her diabetes.  Is checking her blood sugar  With accu aviva test strips 3 times a day, current Rx says 2 times Is using Pen needles 4 a day, current Rx says 2 times a day CDE spoke with triage nurse who is working with her pharmacy to resolve this issue. Will coordinate care and alert patient when it has been resolved.

## 2014-07-28 NOTE — Telephone Encounter (Signed)
Pt may now pick up now and pt states she will pick up tomorrow

## 2014-07-28 NOTE — Telephone Encounter (Signed)
Have sent completed form to pharm

## 2014-08-02 ENCOUNTER — Ambulatory Visit (HOSPITAL_COMMUNITY): Payer: Medicare Other

## 2014-08-03 NOTE — Telephone Encounter (Signed)
Pts visit and mammogram appointment are coordinated for 3/16.

## 2014-09-06 ENCOUNTER — Ambulatory Visit (HOSPITAL_COMMUNITY): Payer: Medicare Other

## 2014-09-08 ENCOUNTER — Ambulatory Visit (INDEPENDENT_AMBULATORY_CARE_PROVIDER_SITE_OTHER): Payer: Medicare Other | Admitting: Obstetrics & Gynecology

## 2014-09-08 ENCOUNTER — Encounter: Payer: Self-pay | Admitting: Obstetrics & Gynecology

## 2014-09-08 ENCOUNTER — Ambulatory Visit (HOSPITAL_COMMUNITY)
Admission: RE | Admit: 2014-09-08 | Discharge: 2014-09-08 | Disposition: A | Discharge status: Home / Self Care | Payer: Medicare Other | Source: Ambulatory Visit | Attending: Internal Medicine | Admitting: Internal Medicine

## 2014-09-08 VITALS — BP 112/62 | HR 96 | Temp 98.1°F | Ht 68.0 in | Wt 218.8 lb

## 2014-09-08 DIAGNOSIS — Z0000 Encounter for general adult medical examination without abnormal findings: Secondary | ICD-10-CM | POA: Diagnosis present

## 2014-09-08 DIAGNOSIS — Z1231 Encounter for screening mammogram for malignant neoplasm of breast: Secondary | ICD-10-CM | POA: Diagnosis not present

## 2014-09-08 LAB — HM MAMMOGRAPHY

## 2014-09-08 NOTE — Progress Notes (Signed)
   Subjective:    Patient ID: [name], female    DOB: 1945/09/16, 69 y.o.   MRN: [id number]  HPI 69 yo AA obese female for a pelvic exam. She has gyn complaints. She is abstinent. Her mammogram is later today.   Review of Systems     Objective:   Physical Exam Obese AA woman NAD Breathing and conversing normally Breast exam- normal EG normal Bimanual exam- her transplanted kidney is in her pelvis but I felt no masses      Assessment & Plan:  Preventative- RTC prn

## 2014-09-13 DIAGNOSIS — R140 Abdominal distension (gaseous): Secondary | ICD-10-CM | POA: Diagnosis not present

## 2014-09-13 DIAGNOSIS — Z86010 Personal history of colonic polyps: Secondary | ICD-10-CM | POA: Diagnosis not present

## 2014-09-13 DIAGNOSIS — Z1211 Encounter for screening for malignant neoplasm of colon: Secondary | ICD-10-CM | POA: Diagnosis not present

## 2014-09-20 ENCOUNTER — Telehealth: Payer: Self-pay | Admitting: *Deleted

## 2014-09-20 NOTE — Telephone Encounter (Signed)
Pt would like her results from her last visit. Advised patient that no testing was done. Patient stated that she has no further questions.

## 2014-09-21 ENCOUNTER — Telehealth: Payer: Self-pay | Admitting: *Deleted

## 2014-09-21 NOTE — Telephone Encounter (Signed)
Pt left message requesting a note that states she was here to see Dr. Hulan Fray last week and had a pelvic exam. She haas appt tomorrow with Dr. Marvel Plan and will need the note. I returned pt's call and informed her that Dr. Marvel Plan will be able to view the notes from her exam with Dr. Hulan Fray in the electronic medical record. Pt voiced understanding.

## 2014-09-22 ENCOUNTER — Encounter: Payer: Self-pay | Admitting: Internal Medicine

## 2014-09-22 ENCOUNTER — Ambulatory Visit (INDEPENDENT_AMBULATORY_CARE_PROVIDER_SITE_OTHER): Payer: Medicare Other | Admitting: Internal Medicine

## 2014-09-22 VITALS — BP 137/68 | HR 82 | Temp 98.1°F | Ht 68.0 in | Wt 223.9 lb

## 2014-09-22 DIAGNOSIS — Z794 Long term (current) use of insulin: Secondary | ICD-10-CM | POA: Diagnosis not present

## 2014-09-22 DIAGNOSIS — I10 Essential (primary) hypertension: Secondary | ICD-10-CM

## 2014-09-22 DIAGNOSIS — E1143 Type 2 diabetes mellitus with diabetic autonomic (poly)neuropathy: Secondary | ICD-10-CM

## 2014-09-22 DIAGNOSIS — G990 Autonomic neuropathy in diseases classified elsewhere: Secondary | ICD-10-CM | POA: Diagnosis not present

## 2014-09-22 DIAGNOSIS — I714 Abdominal aortic aneurysm, without rupture, unspecified: Secondary | ICD-10-CM

## 2014-09-22 DIAGNOSIS — E2839 Other primary ovarian failure: Secondary | ICD-10-CM

## 2014-09-22 DIAGNOSIS — E785 Hyperlipidemia, unspecified: Secondary | ICD-10-CM | POA: Diagnosis not present

## 2014-09-22 DIAGNOSIS — Z0000 Encounter for general adult medical examination without abnormal findings: Secondary | ICD-10-CM

## 2014-09-22 LAB — BASIC METABOLIC PANEL WITH GFR
BUN: 29 mg/dL — AB (ref 6–23)
CHLORIDE: 100 meq/L (ref 96–112)
CO2: 27 meq/L (ref 19–32)
Calcium: 10.2 mg/dL (ref 8.4–10.5)
Creat: 1.53 mg/dL — ABNORMAL HIGH (ref 0.50–1.10)
GFR, Est African American: 40 mL/min — ABNORMAL LOW
GFR, Est Non African American: 35 mL/min — ABNORMAL LOW
Glucose, Bld: 117 mg/dL — ABNORMAL HIGH (ref 70–99)
POTASSIUM: 4.7 meq/L (ref 3.5–5.3)
SODIUM: 136 meq/L (ref 135–145)

## 2014-09-22 LAB — GLUCOSE, CAPILLARY: Glucose-Capillary: 122 mg/dL — ABNORMAL HIGH (ref 70–99)

## 2014-09-22 LAB — POCT GLYCOSYLATED HEMOGLOBIN (HGB A1C): HEMOGLOBIN A1C: 6.9

## 2014-09-22 MED ORDER — PRAVASTATIN SODIUM 40 MG PO TABS: 40.0000 mg | ORAL_TABLET | Freq: Every evening | ORAL | Status: DC

## 2014-09-22 MED ORDER — INSULIN ASPART 100 UNIT/ML FLEXPEN: PEN_INJECTOR | SUBCUTANEOUS | Status: DC

## 2014-09-22 MED ORDER — INSULIN GLARGINE 100 UNIT/ML SOLOSTAR PEN: 35.0000 [IU] | PEN_INJECTOR | Freq: Every day | SUBCUTANEOUS | Status: DC

## 2014-09-22 NOTE — Assessment & Plan Note (Addendum)
Patient had a pap smear and mammogram completed on 09/08/14. Mammogram normal and recommended to repeat in one year.  Referral for DEXA scan.  Colonoscopy in May 2016 by Dr. Carlton Adam  Look into need for referrals back to dermatology and ophthalmology. Patient states she couldn't go back to see them since she wasn't "authorized."

## 2014-09-22 NOTE — Assessment & Plan Note (Addendum)
Patient has a history of AAA repair with stent in November of 2012. She follows with Dr. Nicola Girt. There was concern last year that her graft was leaking as there was an increase of 3 mm seen within 6 months. Patient had imaging done in September 2015. Images were not clear, so Dr. Nicola Girt recommended patient follow up with him. She has an appointment on April 15 to see Dr. Nicola Girt.   -Follow up with Dr. Nicola Girt on October 08, 2014

## 2014-09-22 NOTE — Assessment & Plan Note (Addendum)
Lab Results  Component Value Date   HGBA1C 6.9 09/22/2014   HGBA1C 15.0* 05/14/2014   HGBA1C 7.3 05/08/2013     Assessment: Diabetes control:   Well controlled Progress toward A1C goal:   Greatly improved Comments: Patient has been on Lantus 40 units QHS and Novolog 4 units QAM, 6 units at lunch, and 4 units with dinner. She has been compliant, but states she strongly dislikes giving that many shots a day. After reviewing her glucose records, patient has been having asymptomatic hypoglycemic events in the morning in the 40s-70s. Her lowest was 39. Her afternoon and nighttime glucose are better controlled in the 100-170s.  Plan: Medications: Decrease lantus to 35 units QHS. Decrease Novolog to 6 units with EITHER lunch OR dinner, for total of one dose of novolog per day.  Home glucose monitoring: Frequency:  TID-QID Timing:  before breakfast, lunch, and dinner and at bedtime. Instruction/counseling given:  Educational resources provided:  Information on Hypoglycemia Other plans: Check urine microbalbumin/creatinine ratio. Return in 2 weeks for glucose recheck to ensure patient is no longer having hypoglycemic episodes.

## 2014-09-22 NOTE — Assessment & Plan Note (Addendum)
BP Readings from Last 3 Encounters:  09/22/14 137/68  09/08/14 112/62  07/21/14 126/70    Lab Results  Component Value Date   NA 131* 06/01/2014   K 4.7 06/01/2014   CREATININE 1.34* 06/01/2014    Assessment: Blood pressure control:   Controlled Progress toward BP goal:   Increased Comments: Patient is on lasix 40 mg daily at home, but no other BP medications.   Plan: Medications: Continue to monitor off of medications other than Lasix.  Other plans: Recheck urine microalbumin/creatinine ratio. If greater than 30, consider adding ARB after discussing with Dr. Franchot Gallo.

## 2014-09-22 NOTE — Assessment & Plan Note (Addendum)
Last lipid profile is from 2008 which showed LDL of 113. Patient is on pravastatin 10 mg daily at home. Will increase to moderate intensity statin.   Plan: -Increase pravastatin to 40 mg daily

## 2014-09-22 NOTE — Progress Notes (Signed)
Subjective:    Patient ID: [name], female    DOB: Dec 21, 1945, 69 y.o.   MRN: [id number]  HPI [name] is a 69 yo female with PMHx of T2DM, HTN, hypothyroidism and history of a kidney transplant who presents for follow up for her T2DM. Please see problem oriented charting for more information.  Review of Systems General: Denies fever, chills, fatigue Respiratory: Denies SOB, cough Cardiovascular: Denies chest pain and palpitations  Gastrointestinal: Denies nausea, abdominal pain, diarrhea, constipation Genitourinary: Denies dysuria, urgency Endocrine: Denies polyuria and polydipsia. Musculoskeletal: Denies myalgias Neurological: Denies dizziness, diaphoresis, weakness, lightheadedness  Past Medical History  Diagnosis Date  . Diabetes mellitus   . Hypertension   . Renal disorder   . Morbid obesity     BMI 34  . Allergy   . Blood transfusion without reported diagnosis   . Cataract   . Clotting disorder   . Thyroid disease   . Ulcer    Outpatient Encounter Prescriptions as of 09/22/2014  Medication Sig Note  . ACCU-CHEK SOFTCLIX LANCETS lancets Check blood sugar up to 3 times a day  as instructed   . aspirin EC 81 MG tablet Take 1 tablet (81 mg total) by mouth every evening.   . Blood Glucose Monitoring Suppl (ACCU-CHEK AVIVA PLUS) W/DEVICE KIT 1 Device by Does not apply route 2 (two) times daily.   . Cholecalciferol 1000 UNITS tablet Take 1,000 Units by mouth. 07/21/2014: Received from: [facility]  . furosemide (LASIX) 40 MG tablet Take 1 tablet (40 mg total) by mouth daily.   Marland Kitchen glucose blood (ACCU-CHEK AVIVA) test strip Use to test blood sugars three times a day. Dx code:E11.43.   Marland Kitchen insulin aspart (NOVOLOG FLEXPEN) 100 UNIT/ML FlexPen Inject into the skin 6 units with your largest meal of the day- lunch OR dinner.   . Insulin Glargine (LANTUS) 100 UNIT/ML Solostar Pen Inject 35 Units into the skin daily at 10 pm.   . Insulin Pen Needle 32G  X 4 MM MISC Use to give insulin 4 times a day. Dx code: E11.43.   Marland Kitchen levothyroxine (SYNTHROID, LEVOTHROID) 100 MCG tablet Take 1 tablet (100 mcg total) by mouth daily before breakfast.   . Magnesium 500 MG TABS Take 1,000 mg by mouth daily.    . Multiple Vitamin (MULTIVITAMIN WITH MINERALS) TABS tablet Take 1 tablet by mouth daily.   . mycophenolate (CELLCEPT) 250 MG capsule Take 2 capsules (500 mg total) by mouth 2 (two) times daily.   . pantoprazole (PROTONIX) 40 MG tablet Take 1 tablet (40 mg total) by mouth daily.   . potassium chloride (KLOR-CON) 8 MEQ tablet Take 8 mEq by mouth daily.   . pravastatin (PRAVACHOL) 40 MG tablet Take 1 tablet (40 mg total) by mouth every evening.   . predniSONE (DELTASONE) 5 MG tablet Take 5 mg by mouth daily.   . sodium bicarbonate 650 MG tablet Take 650 mg by mouth as needed for heartburn.   . sulfamethoxazole-trimethoprim (BACTRIM,SEPTRA) 400-80 MG per tablet Take 1 tablet by mouth every Monday, Wednesday, and Friday.   . tacrolimus (PROGRAF) 0.5 MG capsule Take 0.5 mg by mouth 2 (two) times daily.   . tacrolimus (PROGRAF) 1 MG capsule Take 4 capsules (4 mg total) by mouth 2 (two) times daily. Takes 4.$RemoveBefore'5mg'xMoYLXoNImtOK$  total twice daily   . [DISCONTINUED] insulin aspart (NOVOLOG FLEXPEN) 100 UNIT/ML FlexPen Inject into the skin 4 units with breakfast, 6 units with lunch, and 4 units with  dinner.   . [DISCONTINUED] Insulin Glargine (LANTUS) 100 UNIT/ML Solostar Pen Inject 40 Units into the skin daily at 10 pm.   . [DISCONTINUED] pravastatin (PRAVACHOL) 10 MG tablet Take 1 tablet (10 mg total) by mouth every evening.       Objective:   Physical Exam Filed Vitals:   09/22/14 1357  BP: 137/68  Pulse: 82  Temp: 98.1 F (36.7 C)  TempSrc: Oral  Height: $Remove'5\' 8"'CbdAaCB$  (1.727 m)  Weight: 223 lb 14.4 oz (101.56 kg)  SpO2: 100%   General: Vital signs reviewed.  Patient is well-developed and well-nourished, in no acute distress and cooperative with exam.  Cardiovascular:  RRR Pulmonary/Chest: Clear to auscultation bilaterally, no wheezes, rales, or rhonchi. Abdominal: Soft, non-tender, non-distended, BS +. No bruit heard.  Extremities: No lower extremity edema bilaterally Neurological: A&O x3 Skin: Warm, dry and intact. No rashes or erythema. Psychiatric: Normal mood and affect. speech and behavior is normal. Cognition and memory are normal.     Assessment & Plan:   Please see problem based assessment and plan.

## 2014-09-22 NOTE — Progress Notes (Signed)
Case discussed with Dr. Marvel Plan at time of visit. We reviewed the resident's history and exam and pertinent patient test results. I agree with the assessment, diagnosis, and plan of care documented in the resident's note.

## 2014-09-22 NOTE — Patient Instructions (Addendum)
General Instructions:   Thank you for bringing your medicines today. This helps [place] keep you safe from mistakes.   TAKE LANTUS 35 UNITS AT BEDTIME  ONLY TAKE NOVOLOG 6 UNITS WITH THE LARGEST MEAL OF THE DAY (LUNCH OR DINNER). DO NOT TAKE NOVOLOG WITH BREAKFAST. CONTINUE TO CHECK YOUR BLOOD SUGAR AS BEFORE.  FOLLOW UP IN ABOUT 2 WEEKS.  YOU ARE DOING A GREAT JOB!   Low Blood Sugar Low blood sugar (hypoglycemia) means that the level of sugar in your blood is lower than it should be. Signs of low blood sugar include:  Getting sweaty.  Feeling hungry.  Feeling dizzy or weak.  Feeling sleepier than normal.  Feeling nervous.  Headaches.  Having a fast heartbeat. Low blood sugar can happen fast and can be an emergency. Your doctor can do tests to check your blood sugar level. You can have low blood sugar and not have diabetes. HOME CARE  Check your blood sugar as told by your doctor. If it is less than 70 mg/dl or as told by your doctor, take 1 of the following:  3 to 4 glucose tablets.   cup clear juice.   cup soda pop, not diet.  1 cup milk.  5 to 6 hard candies.  Recheck blood sugar after 15 minutes. Repeat until it is at the right level.  Eat a snack if it is more than 1 hour until the next meal.  Only take medicine as told by your doctor.  Do not skip meals. Eat on time.  Do not drink alcohol except with meals.  Check your blood glucose before driving.  Check your blood glucose before and after exercise.  Always carry treatment with you, such as glucose pills.  Always wear a medical alert bracelet if you have diabetes. GET HELP RIGHT AWAY IF:   Your blood glucose goes below 70 mg/dl or as told by your doctor, and you:  Are confused.  Are not able to swallow.  Pass out (faint).  You cannot treat yourself. You may need someone to help you.  You have low blood sugar problems often.  You have problems from your medicines.  You are not feeling  better after 3 to 4 days.  You have vision changes. MAKE SURE YOU:   Understand these instructions.  Will watch this condition.  Will get help right away if you are not doing well or get worse. Document Released: 09/05/2009 Document Revised: 09/03/2011 Document Reviewed: 09/05/2009 [facility] Patient Information 2015 [place], [place]. This information is not intended to replace advice given to you by your health care provider. Make sure you discuss any questions you have with your health care provider.

## 2014-09-23 LAB — MICROALBUMIN / CREATININE URINE RATIO
Creatinine, Urine: 20.5 mg/dL
Microalb Creat Ratio: 24.4 mg/g (ref 0.0–30.0)
Microalb, Ur: 0.5 mg/dL (ref <2.0)

## 2014-10-08 DIAGNOSIS — Z95828 Presence of other vascular implants and grafts: Secondary | ICD-10-CM | POA: Diagnosis not present

## 2014-10-08 DIAGNOSIS — Z8679 Personal history of other diseases of the circulatory system: Secondary | ICD-10-CM | POA: Diagnosis not present

## 2014-10-08 DIAGNOSIS — T82338A Leakage of other vascular grafts, initial encounter: Secondary | ICD-10-CM | POA: Diagnosis not present

## 2014-10-08 DIAGNOSIS — I714 Abdominal aortic aneurysm, without rupture: Secondary | ICD-10-CM | POA: Diagnosis not present

## 2014-10-11 ENCOUNTER — Ambulatory Visit (INDEPENDENT_AMBULATORY_CARE_PROVIDER_SITE_OTHER): Payer: Medicare Other | Admitting: Internal Medicine

## 2014-10-11 ENCOUNTER — Encounter: Payer: Self-pay | Admitting: Internal Medicine

## 2014-10-11 VITALS — BP 155/84 | HR 77 | Temp 98.0°F | Wt 230.5 lb

## 2014-10-11 DIAGNOSIS — I10 Essential (primary) hypertension: Secondary | ICD-10-CM

## 2014-10-11 DIAGNOSIS — E1143 Type 2 diabetes mellitus with diabetic autonomic (poly)neuropathy: Secondary | ICD-10-CM

## 2014-10-11 NOTE — Patient Instructions (Signed)
It was a pleasure seeing you today, [name].   Continue Lantus 35 units at bedtime Continue Novolog 6 units with your biggest meal (lunch OR dinner) Your sugars are doing great!  General Instructions:   Please bring your medicines with you each time you come to clinic.  Medicines may include prescription medications, over-the-counter medications, herbal remedies, eye drops, vitamins, or other pills.   Progress Toward Treatment Goals:  No flowsheet data found.  Self Care Goals & Plans:  Self Care Goal 06/22/2014  Manage my medications take my medicines as prescribed; bring my medications to every visit; refill my medications on time  Monitor my health keep track of my blood glucose; bring my glucose meter and log to each visit  Eat healthy foods drink diet soda or water instead of juice or soda; eat more vegetables; eat foods that are low in salt; eat baked foods instead of fried foods    No flowsheet data found.   Care Management & Community Referrals:  No flowsheet data found.

## 2014-10-11 NOTE — Progress Notes (Signed)
   Subjective:    Patient ID: [name], female    DOB: [birth year], 69 y.o.   MRN: [id number]  HPI Ms. Deupree is a 69yo woman with PMHx of Type 2 DM with neuropathy, HTN, hypothyroidism, and AAA who presents today for follow up of her blood sugars after changes to her insulin regimen due to frequent hypoglycemia. Her insulin regimen was changed to Lantus 35 units at bedtime and Novolog 6 units at either lunch or dinner (her biggest meal). Patient reports compliance with this. Per blood glucose meter, CBGs are well controlled ranging from 66-123 in the morning, 86-156 at lunch, and 109-216 at dinner time. She only had 1 low CBG of 66 in the morning. This contrasts to multiple lows at breakfast time previously. Patient states she is aware of when she has low blood sugar and notes associated weakness and not thinking clearly. She drinks 4 oz of juice when she feels low blood sugar.     Review of Systems General: Denies fever, chills, night sweats, changes in weight, changes in appetite HEENT: Denies headaches, ear pain, changes in vision, rhinorrhea, sore throat CV: Denies CP, palpitations, SOB, orthopnea Pulm: Denies SOB, cough, wheezing GI: Denies abdominal pain, nausea, vomiting, diarrhea, constipation, melena, hematochezia GU: Denies dysuria, hematuria, frequency Msk: Denies muscle cramps, joint pains Neuro: Denies weakness, numbness, tingling Skin: Denies rashes, bruising    Objective:   Physical Exam General: sitting up in chair, NAD, pleasant  HEENT: [place]/AT, EOMI, sclera anicteric, mucus membranes moist CV: RRR, no m/g/r Pulm: CTA bilaterally, breaths non-labored  Abd: BS+, soft, obese, non-tender Ext: warm, no edema, moves all Neuro: alert and oriented x 3, no focal deficits      Assessment & Plan:

## 2014-10-11 NOTE — Assessment & Plan Note (Signed)
Lab Results  Component Value Date   HGBA1C 6.9 09/22/2014   HGBA1C 15.0* 05/14/2014   HGBA1C 7.3 05/08/2013     Assessment: Diabetes control: good control (HgbA1C at goal) Progress toward A1C goal:  at goal Comments: Blood sugars much more stable. Only one low CBG per her meter and patient notes less hypoglycemia symptoms.   Plan: Medications:  continue current medications Home glucose monitoring: Frequency: 3 times a day Timing: before meals Instruction/counseling given: reminded to bring blood glucose meter & log to each visit and reminded to bring medications to each visit Other plans:  - Continue Lantus 35 units at bedtime - Continue Novolog 6 units at EITHER lunch OR dinner (whichever is her largest meal, seems to be dinner most days) - Hypoglycemia symptoms reviewed and treatment  - f/u in 3 months with PCP to assess how blood sugars are doing

## 2014-10-11 NOTE — Assessment & Plan Note (Signed)
BP Readings from Last 3 Encounters:  10/11/14 155/84  09/22/14 137/68  09/08/14 112/62    Lab Results  Component Value Date   NA 136 09/22/2014   K 4.7 09/22/2014   CREATININE 1.53* 09/22/2014    Assessment: Blood pressure control: mildly elevated Progress toward BP goal:  deteriorated Comments: BP slightly elevated today. Patient reports she was taken off her Amlodipine recently due to low BPs. Urine microalbumin 24.4. Patient may benefit from a low dose ACE regardless. BP should also be well controlled with her AAA and associated leak for which she is having surgery soon at [facility].   Plan: Medications:  continue current medications Other plans:  - Continue Lasix 40 mg daily - Consider adding ACE given her history of DM. Evidence has shown patients benefit from ACE regardless of BP. Will defer to PCP.

## 2014-10-12 NOTE — Progress Notes (Signed)
INTERNAL MEDICINE TEACHING ATTENDING ADDENDUM - [employer], MD: I reviewed and discussed at the time of visit with the resident Dr. Rivet, the patient's medical history, physical examination, diagnosis and results of pertinent tests and treatment and I agree with the patient's care as documented.  

## 2014-10-21 ENCOUNTER — Other Ambulatory Visit: Payer: Medicare Other

## 2014-10-21 ENCOUNTER — Telehealth: Payer: Self-pay | Admitting: *Deleted

## 2014-10-21 DIAGNOSIS — R21 Rash and other nonspecific skin eruption: Secondary | ICD-10-CM

## 2014-10-21 NOTE — Telephone Encounter (Signed)
Call from pt - requesting a dermatology referral to [facility] with Dr Jarome Matin. Rash on her back. Stated she had to cancel appts d/t no having a referral.

## 2014-10-25 DIAGNOSIS — R21 Rash and other nonspecific skin eruption: Secondary | ICD-10-CM | POA: Insufficient documentation

## 2014-10-25 NOTE — Telephone Encounter (Signed)
Hi Glenda,  Thank you for making me aware. I have placed a referral to Dermatology for this patient. Will you please let her know? Let me know if there is anything else you need from me.  Thank you! [place]

## 2014-10-26 DIAGNOSIS — I714 Abdominal aortic aneurysm, without rupture: Secondary | ICD-10-CM | POA: Diagnosis not present

## 2014-11-04 DIAGNOSIS — I10 Essential (primary) hypertension: Secondary | ICD-10-CM | POA: Diagnosis not present

## 2014-11-04 DIAGNOSIS — K219 Gastro-esophageal reflux disease without esophagitis: Secondary | ICD-10-CM | POA: Diagnosis not present

## 2014-11-04 DIAGNOSIS — E119 Type 2 diabetes mellitus without complications: Secondary | ICD-10-CM | POA: Diagnosis not present

## 2014-11-04 DIAGNOSIS — Z940 Kidney transplant status: Secondary | ICD-10-CM | POA: Diagnosis not present

## 2014-11-04 DIAGNOSIS — E876 Hypokalemia: Secondary | ICD-10-CM | POA: Diagnosis not present

## 2014-11-04 DIAGNOSIS — Z79899 Other long term (current) drug therapy: Secondary | ICD-10-CM | POA: Diagnosis not present

## 2014-11-04 DIAGNOSIS — E785 Hyperlipidemia, unspecified: Secondary | ICD-10-CM | POA: Diagnosis not present

## 2014-11-04 DIAGNOSIS — I714 Abdominal aortic aneurysm, without rupture: Secondary | ICD-10-CM | POA: Diagnosis not present

## 2014-11-10 DIAGNOSIS — Z6835 Body mass index (BMI) 35.0-35.9, adult: Secondary | ICD-10-CM | POA: Diagnosis not present

## 2014-11-10 DIAGNOSIS — E049 Nontoxic goiter, unspecified: Secondary | ICD-10-CM | POA: Diagnosis present

## 2014-11-10 DIAGNOSIS — E669 Obesity, unspecified: Secondary | ICD-10-CM | POA: Diagnosis present

## 2014-11-10 DIAGNOSIS — I444 Left anterior fascicular block: Secondary | ICD-10-CM | POA: Diagnosis not present

## 2014-11-10 DIAGNOSIS — Z87891 Personal history of nicotine dependence: Secondary | ICD-10-CM | POA: Diagnosis not present

## 2014-11-10 DIAGNOSIS — Z794 Long term (current) use of insulin: Secondary | ICD-10-CM | POA: Diagnosis not present

## 2014-11-10 DIAGNOSIS — Z86718 Personal history of other venous thrombosis and embolism: Secondary | ICD-10-CM | POA: Diagnosis not present

## 2014-11-10 DIAGNOSIS — I517 Cardiomegaly: Secondary | ICD-10-CM | POA: Diagnosis not present

## 2014-11-10 DIAGNOSIS — T82338A Leakage of other vascular grafts, initial encounter: Secondary | ICD-10-CM | POA: Diagnosis not present

## 2014-11-10 DIAGNOSIS — R112 Nausea with vomiting, unspecified: Secondary | ICD-10-CM | POA: Diagnosis not present

## 2014-11-10 DIAGNOSIS — E119 Type 2 diabetes mellitus without complications: Secondary | ICD-10-CM | POA: Diagnosis present

## 2014-11-10 DIAGNOSIS — E039 Hypothyroidism, unspecified: Secondary | ICD-10-CM | POA: Diagnosis present

## 2014-11-10 DIAGNOSIS — K219 Gastro-esophageal reflux disease without esophagitis: Secondary | ICD-10-CM | POA: Diagnosis present

## 2014-11-10 DIAGNOSIS — Z940 Kidney transplant status: Secondary | ICD-10-CM | POA: Diagnosis not present

## 2014-11-10 DIAGNOSIS — I714 Abdominal aortic aneurysm, without rupture: Secondary | ICD-10-CM | POA: Diagnosis not present

## 2014-11-10 DIAGNOSIS — I10 Essential (primary) hypertension: Secondary | ICD-10-CM | POA: Diagnosis present

## 2014-11-10 DIAGNOSIS — I499 Cardiac arrhythmia, unspecified: Secondary | ICD-10-CM | POA: Diagnosis not present

## 2014-11-10 DIAGNOSIS — E785 Hyperlipidemia, unspecified: Secondary | ICD-10-CM | POA: Diagnosis present

## 2014-12-16 ENCOUNTER — Encounter: Payer: Self-pay | Admitting: *Deleted

## 2014-12-31 DIAGNOSIS — B372 Candidiasis of skin and nail: Secondary | ICD-10-CM | POA: Diagnosis not present

## 2014-12-31 DIAGNOSIS — L308 Other specified dermatitis: Secondary | ICD-10-CM | POA: Diagnosis not present

## 2014-12-31 DIAGNOSIS — D171 Benign lipomatous neoplasm of skin and subcutaneous tissue of trunk: Secondary | ICD-10-CM | POA: Diagnosis not present

## 2014-12-31 DIAGNOSIS — L853 Xerosis cutis: Secondary | ICD-10-CM | POA: Diagnosis not present

## 2015-02-10 DIAGNOSIS — Z940 Kidney transplant status: Secondary | ICD-10-CM | POA: Diagnosis not present

## 2015-02-10 DIAGNOSIS — I10 Essential (primary) hypertension: Secondary | ICD-10-CM | POA: Diagnosis not present

## 2015-02-10 DIAGNOSIS — Z79899 Other long term (current) drug therapy: Secondary | ICD-10-CM | POA: Diagnosis not present

## 2015-02-18 DIAGNOSIS — M793 Panniculitis, unspecified: Secondary | ICD-10-CM | POA: Diagnosis not present

## 2015-02-22 DIAGNOSIS — Z7982 Long term (current) use of aspirin: Secondary | ICD-10-CM | POA: Diagnosis not present

## 2015-02-22 DIAGNOSIS — E1122 Type 2 diabetes mellitus with diabetic chronic kidney disease: Secondary | ICD-10-CM | POA: Diagnosis not present

## 2015-02-22 DIAGNOSIS — Z79899 Other long term (current) drug therapy: Secondary | ICD-10-CM | POA: Diagnosis not present

## 2015-02-22 DIAGNOSIS — Z940 Kidney transplant status: Secondary | ICD-10-CM | POA: Diagnosis not present

## 2015-02-22 DIAGNOSIS — N186 End stage renal disease: Secondary | ICD-10-CM | POA: Diagnosis not present

## 2015-02-22 DIAGNOSIS — N2889 Other specified disorders of kidney and ureter: Secondary | ICD-10-CM | POA: Diagnosis not present

## 2015-02-22 DIAGNOSIS — E785 Hyperlipidemia, unspecified: Secondary | ICD-10-CM | POA: Diagnosis not present

## 2015-02-22 DIAGNOSIS — Z794 Long term (current) use of insulin: Secondary | ICD-10-CM | POA: Diagnosis not present

## 2015-02-22 DIAGNOSIS — I151 Hypertension secondary to other renal disorders: Secondary | ICD-10-CM | POA: Diagnosis not present

## 2015-02-22 DIAGNOSIS — D899 Disorder involving the immune mechanism, unspecified: Secondary | ICD-10-CM | POA: Diagnosis not present

## 2015-02-22 DIAGNOSIS — N281 Cyst of kidney, acquired: Secondary | ICD-10-CM | POA: Diagnosis not present

## 2015-02-22 DIAGNOSIS — Z4822 Encounter for aftercare following kidney transplant: Secondary | ICD-10-CM | POA: Diagnosis not present

## 2015-02-22 DIAGNOSIS — I120 Hypertensive chronic kidney disease with stage 5 chronic kidney disease or end stage renal disease: Secondary | ICD-10-CM | POA: Diagnosis not present

## 2015-02-22 DIAGNOSIS — K219 Gastro-esophageal reflux disease without esophagitis: Secondary | ICD-10-CM | POA: Diagnosis not present

## 2015-02-22 DIAGNOSIS — M793 Panniculitis, unspecified: Secondary | ICD-10-CM | POA: Diagnosis not present

## 2015-02-23 ENCOUNTER — Encounter: Payer: Self-pay | Admitting: Internal Medicine

## 2015-02-23 ENCOUNTER — Telehealth: Payer: Self-pay | Admitting: Internal Medicine

## 2015-02-23 ENCOUNTER — Ambulatory Visit (INDEPENDENT_AMBULATORY_CARE_PROVIDER_SITE_OTHER): Payer: Medicare Other | Admitting: Internal Medicine

## 2015-02-23 ENCOUNTER — Ambulatory Visit (HOSPITAL_COMMUNITY)
Admission: RE | Admit: 2015-02-23 | Discharge: 2015-02-23 | Disposition: A | Discharge status: Home / Self Care | Payer: Medicare Other | Source: Ambulatory Visit | Attending: Internal Medicine | Admitting: Internal Medicine

## 2015-02-23 ENCOUNTER — Other Ambulatory Visit: Payer: Self-pay | Admitting: Internal Medicine

## 2015-02-23 VITALS — BP 137/72 | HR 98 | Temp 98.6°F | Ht 68.0 in | Wt 237.6 lb

## 2015-02-23 DIAGNOSIS — M79605 Pain in left leg: Secondary | ICD-10-CM

## 2015-02-23 DIAGNOSIS — R609 Edema, unspecified: Secondary | ICD-10-CM | POA: Diagnosis not present

## 2015-02-23 DIAGNOSIS — M79604 Pain in right leg: Secondary | ICD-10-CM | POA: Diagnosis not present

## 2015-02-23 DIAGNOSIS — Z794 Long term (current) use of insulin: Secondary | ICD-10-CM | POA: Diagnosis not present

## 2015-02-23 DIAGNOSIS — Z940 Kidney transplant status: Secondary | ICD-10-CM | POA: Diagnosis not present

## 2015-02-23 DIAGNOSIS — I10 Essential (primary) hypertension: Secondary | ICD-10-CM

## 2015-02-23 DIAGNOSIS — R600 Localized edema: Secondary | ICD-10-CM

## 2015-02-23 DIAGNOSIS — M79662 Pain in left lower leg: Secondary | ICD-10-CM | POA: Diagnosis not present

## 2015-02-23 DIAGNOSIS — E1143 Type 2 diabetes mellitus with diabetic autonomic (poly)neuropathy: Secondary | ICD-10-CM

## 2015-02-23 DIAGNOSIS — M79661 Pain in right lower leg: Secondary | ICD-10-CM | POA: Diagnosis not present

## 2015-02-23 DIAGNOSIS — E039 Hypothyroidism, unspecified: Secondary | ICD-10-CM | POA: Diagnosis not present

## 2015-02-23 LAB — POCT GLYCOSYLATED HEMOGLOBIN (HGB A1C): Hemoglobin A1C: 6.7

## 2015-02-23 LAB — GLUCOSE, CAPILLARY: GLUCOSE-CAPILLARY: 156 mg/dL — AB (ref 65–99)

## 2015-02-23 NOTE — Patient Instructions (Signed)
STOP TAKING AMLODIPINE AND PRAVASTATIN FOR NOW.  CONTINUE TAKING LASIX 40 MG TO 60 MG PER DAY.  TAKE ALL OTHER MEDICATIONS THE SAME.   GET THE ULTRASOUND OF YOUR LEGS.  RETURN IN ONE WEEK.

## 2015-02-23 NOTE — Telephone Encounter (Signed)
Pt called requesting the nurse to call back regarding insulin.

## 2015-02-23 NOTE — Progress Notes (Signed)
VASCULAR LAB PRELIMINARY  PRELIMINARY  PRELIMINARY  PRELIMINARY  Bilateral lower extremity venous duplex completed.    Preliminary report:  Bilateral:  No evidence of DVT, superficial thrombosis, or Baker's Cyst.   Erion Hermans, RVS 02/23/2015, 5:16 PM

## 2015-02-23 NOTE — Telephone Encounter (Signed)
Pt called asking to be taken off lantus and novolog.  She states she is having leg pain and swelling since November.  Legs are very hard.  She has seen a dermatologist who gave her some cream but that is not helping.   She also saw a nephrologist that, she states, told her it was fluid.   Last visit in clinic 4/18 with PCP.  Pt denies SOB.  I gave her an appointment for today with PCP to evaluated leg edema and review meds.

## 2015-02-23 NOTE — Telephone Encounter (Signed)
Agree with plan 

## 2015-02-23 NOTE — Telephone Encounter (Signed)
   Reason for call:   I received a call from [name] at 6 PM indicating she wants an antibiotic for her leg pain.   Pertinent Data:   She was seen in [facility] today for b/l leg pain/swelling.  Preliminary duplex US result is negative for DVT.  Patient says she is using a "cream" on her legs and taking codeine which helps a little.    She says the pain is present all the time, night and day, with walking and at rest and has been present for several weeks.   Assessment / Plan / Recommendations:   I informed her that there was no evidence of clot.  I advised that if Dr. Nena Alexander evaluation had been concerning for infection then antibiotics would be indicated but if that was not the case they would not be indicated.    I advised that I will relay message to Dr. Marvel Plan so they can discuss wether or not further evaluation or treatment is necessary.  She will continue current therapy for leg pain.  As always, pt is advised that if symptoms worsen or new symptoms arise, they should go to an urgent care facility or to to ER for further evaluation.   Francesca Oman, DO   02/23/2015, 6:43 PM

## 2015-02-24 ENCOUNTER — Other Ambulatory Visit: Payer: Self-pay | Admitting: Internal Medicine

## 2015-02-24 ENCOUNTER — Telehealth: Payer: Self-pay | Admitting: Internal Medicine

## 2015-02-24 ENCOUNTER — Telehealth: Payer: Self-pay | Admitting: *Deleted

## 2015-02-24 DIAGNOSIS — M79604 Pain in right leg: Secondary | ICD-10-CM | POA: Insufficient documentation

## 2015-02-24 DIAGNOSIS — M79605 Pain in left leg: Secondary | ICD-10-CM

## 2015-02-24 MED ORDER — MEDICAL COMPRESSION STOCKINGS MISC: 2.0000 | Freq: Every day | Status: DC

## 2015-02-24 MED ORDER — HYDROCODONE-ACETAMINOPHEN 5-325 MG PO TABS: 1.0000 | ORAL_TABLET | Freq: Four times a day (QID) | ORAL | Status: DC | PRN

## 2015-02-24 NOTE — Telephone Encounter (Signed)
Call from pt - stated she's having a lot of leg pain and wants something else beside Tylenol w/Codeine. Thanks

## 2015-02-24 NOTE — Assessment & Plan Note (Addendum)
Patient presents with a 4 week history of lower extremity edema, pain, erythema, skin thickening and increased warmth. Warmth, skin changes are primarily located at posterior calves bilaterally with 1+ pitting edema in ankles bilaterally. Presentation is not consistent with cellulitis (normal WBC, no fever, chills and skin changes are not consistent). Doubt CHF exacerbation as patient does not have prior history of CHF, lungs are clear, no SOB and skin changes are not consistent with LE Edema to volume overload. DVT was ruled out with negative LE dopplers. 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 Patient's dermatologist prescribed clobetasol cream which is normally given for this condition. I will obtain records to see what his diagnosis was. Diagnosis seems consistent as this disease presents due to venous insufficiency either acutely or chronically. Typically acute LDS will present with painful inflammation in the inner leg above the ankle and the area is red, tender, warm with thickening. 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 She plans on obtaining MRI on 9/20. She also recommended follow up with patient's dermatologist for muscle biopsy.   Plan: -Dopplers ruled out DVT -Compression stockings -Resume lasix 40 daily (do not continue 60 daily) -Pain medication -Continue clobetasol cream -Holding statin and amlodipine to see if it helps edema -Recheck in 1-2 weeks -Nephrologist planned MRI for 9/20  At follow up visit, I think it would be worth ruling out dermatomyositis given possible panniculitis of calf muscles in addition to elevated CRP, ESR and CK. 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 At follow up visit, consider checking anti-Jo, aldolase, LDH. Consider starting on prednisone if dermatomyositis is likely.

## 2015-02-24 NOTE — Telephone Encounter (Signed)
I am not at work today as I am starting Night Shift tonight. However, I will put a pain medicine prescription in the office tonight so that she can pick it up tomorrow. Will you please let her know?   Thank you, Rhythm Gubbels

## 2015-02-24 NOTE — Telephone Encounter (Signed)
Call from pt about pain medication - informed pt Dr Marvel Plan is not here today and she write a rx tonight to be picked up tomorrow. And I will call her tomorrow when it is ready. Stated alright.

## 2015-02-24 NOTE — Progress Notes (Signed)
   Subjective:    Patient ID: [name], female    DOB: [birth year], 69 y.o.   MRN: [id number]  HPI Ms. Noack is a 69 yo female with PMHx of HTN, T2DM and h/o kidney transplant who presents with acute complaint of leg swelling and pain. Please see problem oriented assessment and plan for more information.  Review of Systems General: Denies fever, chills, fatigue Respiratory: Denies SOB, cough, DOE Cardiovascular: Denies chest pain and palpitations.  Gastrointestinal: Denies nausea, vomiting, abdominal pain, diarrhea, constipation Musculoskeletal: Admits to leg pain bilaterally. Denies back pain, joint swelling, arthralgias Skin: Admits to skin thickening, warmth. Denies pallor, rash and wounds.  Neurological: Denies dizziness, headaches, weakness, lightheadedness    Objective:   Physical Exam Filed Vitals:   02/23/15 1518  BP: 137/72  Pulse: 98  Temp: 98.6 F (37 C)  TempSrc: Oral  Height: 5\' 8"  (1.727 m)  Weight: 237 lb 9.6 oz (107.775 kg)  SpO2: 95%   General: Vital signs reviewed.  Patient is well-developed and well-nourished, in no acute distress and cooperative with exam.  Cardiovascular: RRR, S1 normal, S2 normal, no murmurs, gallops, or rubs. Pulmonary/Chest: Clear to auscultation bilaterally, no wheezes, rales, or rhonchi. Abdominal: Soft, non-tender, non-distended, BS + Extremities: Lower extremities are tender to the touch, over the calves the skin is thickened, hyperpigmented, erythematous, and warms. 1+ pitting edema to ankles bilaterally. Pulses symmetric and intact bilaterally. Neurological: A&O x3 Skin: Warm, dry and intact. No rashes or erythema. Psychiatric: Normal mood and affect. speech and behavior is normal. Cognition and memory are normal.     Assessment & Plan:   Please see problem oriented assessment and plan.

## 2015-02-24 NOTE — Assessment & Plan Note (Signed)
Lab Results  Component Value Date   HGBA1C 6.7 02/23/2015   HGBA1C 6.9 09/22/2014   HGBA1C 15.0* 05/14/2014     Assessment: Diabetes control:  Controlled Progress toward A1C goal:   At goal Comments: On Novolog 6 units with largest meal of the day and Lantus 35 units QHS  Plan: Medications:  continue current medications Home glucose monitoring: Frequency:   Timing:   Instruction/counseling given: discussed the need for weight loss and discussed diet

## 2015-02-24 NOTE — Telephone Encounter (Signed)
Called patient to discuss results of her ultrasound and the possibilities of her diagnosis. Please see my clinic note for more information. We agreed on the plan of compression stockings, continued corticosteroid cream and pain control. Patient will follow up in 1-2 weeks in the [facility] clinic.

## 2015-02-24 NOTE — Assessment & Plan Note (Signed)
BP Readings from Last 3 Encounters:  02/23/15 137/72  10/11/14 155/84  09/22/14 137/68    Lab Results  Component Value Date   NA 136 09/22/2014   K 4.7 09/22/2014   CREATININE 1.53* 09/22/2014    Assessment: Blood pressure control:  Controlled Progress toward BP goal:   At goal  Plan: Medications:  continue current medications

## 2015-02-25 ENCOUNTER — Other Ambulatory Visit: Payer: Self-pay | Admitting: Internal Medicine

## 2015-02-25 DIAGNOSIS — L03115 Cellulitis of right lower limb: Secondary | ICD-10-CM | POA: Diagnosis not present

## 2015-02-25 DIAGNOSIS — L03116 Cellulitis of left lower limb: Secondary | ICD-10-CM | POA: Diagnosis not present

## 2015-02-25 DIAGNOSIS — I714 Abdominal aortic aneurysm, without rupture: Secondary | ICD-10-CM | POA: Diagnosis not present

## 2015-02-25 MED ORDER — LEVOTHYROXINE SODIUM 125 MCG PO TABS: 125.0000 ug | ORAL_TABLET | Freq: Every day | ORAL | Status: DC

## 2015-02-25 NOTE — Telephone Encounter (Signed)
Message left at home number Rx is ready to pick up. Hilda Blades Ahmon Tosi RN 02/25/15 9:15AM

## 2015-02-25 NOTE — Assessment & Plan Note (Signed)
TSH 6.874 checked at [facility] on 8/30. Patient is on Synthroid 100 mcg daily.  Plan: -Increase Synthroid to 125 mcg daily

## 2015-02-25 NOTE — Addendum Note (Signed)
Addended by: Vickii Chafe on: 02/25/2015 09:26 PM   Modules accepted: Orders

## 2015-03-01 ENCOUNTER — Other Ambulatory Visit: Payer: Self-pay | Admitting: Internal Medicine

## 2015-03-01 ENCOUNTER — Telehealth: Payer: Self-pay | Admitting: *Deleted

## 2015-03-01 MED ORDER — HYDROCODONE-ACETAMINOPHEN 5-325 MG PO TABS: 2.0000 | ORAL_TABLET | Freq: Four times a day (QID) | ORAL | Status: DC | PRN

## 2015-03-01 NOTE — Progress Notes (Signed)
Internal Medicine Clinic Attending  I saw and evaluated the patient.  I personally confirmed the key portions of the history and exam documented by Dr. Richardson and I reviewed pertinent patient test results.  The assessment, diagnosis, and plan were formulated together and I agree with the documentation in the resident's note. 

## 2015-03-01 NOTE — Telephone Encounter (Signed)
Pt called / informed TSH level is high and need to increase Synthroid to 125 mcg daily per Dr Marvel Plan. And rx has been sent to the pharmay. Wanted to know where this test was done - informed her results from [place] the pharmacy had already called her but at that time she did not know about new rx.  Also stated Hydrocodone is not helping with her leg pain; pain level #8.5; "it's like taking Tylenol".  She requesting something else for the pain. Thanks

## 2015-03-01 NOTE — Telephone Encounter (Signed)
[name], will you tell her she can increase her Norco 5-325 to 2 TABLETS every 6 hours? I have changed the dose in the computer. She was previously taking one every 6 hours.   Given her severe continued pain, patient will likely need to be seen sooner. I have included the front desk staff on this message. She currently has an appointment for 9/12, but I'm concerned about her leg pain and swelling. Can we get an appointment for her earlier this week?  Thank you for your assistance with this while I am on Night Float!  Rosalin Buster Marvel Plan

## 2015-03-01 NOTE — Telephone Encounter (Signed)
-----   Message from Vickii Chafe, MD sent at 02/25/2015  9:26 PM EDT ----- Regarding: Medication Change Do you mind calling this patient to let her know I sent in a new prescription of Synthroid to her pharmacy? Her TSH was high indicating she needed a higher dose.   Thank you!

## 2015-03-02 NOTE — Telephone Encounter (Signed)
Pt called - instructed she can increase Norco to 2 tabs every 6 hours. Voiced understanding. Stated she uses medial transportation and will not be able to come sooner than her appt on the 12th. Also she has an appt in [place] for CT on Friday the 9th. ANd MRI on the 20th for her leg.

## 2015-03-04 DIAGNOSIS — I714 Abdominal aortic aneurysm, without rupture: Secondary | ICD-10-CM | POA: Diagnosis not present

## 2015-03-07 ENCOUNTER — Ambulatory Visit: Payer: Medicare Other | Admitting: Internal Medicine

## 2015-03-08 ENCOUNTER — Other Ambulatory Visit: Payer: Self-pay | Admitting: Internal Medicine

## 2015-03-08 MED ORDER — HYDROCODONE-ACETAMINOPHEN 5-325 MG PO TABS: 2.0000 | ORAL_TABLET | Freq: Four times a day (QID) | ORAL | Status: DC | PRN

## 2015-03-08 NOTE — Telephone Encounter (Signed)
Pt is taking 2 pills 3 - 4 times a day.  Refilled on 9/6 # 60 She is out of med today.  Will you refill?

## 2015-03-08 NOTE — Telephone Encounter (Signed)
Pt called requesting hydrocodone to be filled.  °

## 2015-03-08 NOTE — Telephone Encounter (Signed)
Yes, I will refill this; however, patient was supposed to have an [facility] appointment either today or yesterday, but she does not appear to have gone, nor does she have any future appointments scheduled with [place]. Vascular surgery seems to think leg pain is secondary to lymphedema versus cellulitis and patient is now on Keflex.  I will refill pain medication, but patient needs to have an appointment with [facility] to be seen soon. Can you please ask her to schedule an appointment with [place] soon?  Thank you, Corinthia Helmers

## 2015-03-09 NOTE — Telephone Encounter (Signed)
Pt informed that rx is ready for pickup, but she must make an appt to be seen soon.  Pt states that she does not have transportation and will try to make arrangements to be seen. Pt encouraged to make appt the same she picks up rx, but wasn't sure if that would be possible. Regenia Skeeter, Montia Haslip Cassady9/14/20163:20 PM

## 2015-03-22 ENCOUNTER — Ambulatory Visit: Payer: Medicare Other | Admitting: Internal Medicine

## 2015-03-22 DIAGNOSIS — M793 Panniculitis, unspecified: Secondary | ICD-10-CM | POA: Diagnosis not present

## 2015-04-12 DIAGNOSIS — M793 Panniculitis, unspecified: Secondary | ICD-10-CM | POA: Diagnosis not present

## 2015-04-15 DIAGNOSIS — R600 Localized edema: Secondary | ICD-10-CM | POA: Diagnosis not present

## 2015-04-15 DIAGNOSIS — I714 Abdominal aortic aneurysm, without rupture: Secondary | ICD-10-CM | POA: Diagnosis not present

## 2015-04-22 DIAGNOSIS — L02419 Cutaneous abscess of limb, unspecified: Secondary | ICD-10-CM | POA: Diagnosis not present

## 2015-04-22 DIAGNOSIS — L03119 Cellulitis of unspecified part of limb: Secondary | ICD-10-CM | POA: Diagnosis not present

## 2015-04-23 ENCOUNTER — Other Ambulatory Visit: Payer: Self-pay | Admitting: Internal Medicine

## 2015-04-26 ENCOUNTER — Encounter: Payer: Self-pay | Admitting: Internal Medicine

## 2015-04-26 ENCOUNTER — Ambulatory Visit: Payer: Medicare Other | Admitting: Internal Medicine

## 2015-05-06 DIAGNOSIS — I8310 Varicose veins of unspecified lower extremity with inflammation: Secondary | ICD-10-CM | POA: Diagnosis not present

## 2015-05-06 DIAGNOSIS — Z940 Kidney transplant status: Secondary | ICD-10-CM | POA: Diagnosis not present

## 2015-05-09 ENCOUNTER — Telehealth: Payer: Self-pay | Admitting: Dietician

## 2015-05-09 ENCOUNTER — Encounter: Payer: Self-pay | Admitting: Dietician

## 2015-05-09 NOTE — Telephone Encounter (Signed)
CDE called patient about getting her annual diabetes eye exam: she Does not want to go to the eye doctor with whom she had been scheduled from her referral. However, she is willing to have retinal images done here while she is here to see the doctor tomorrow. CDE will put her on the schedule for an eye exam.

## 2015-05-10 ENCOUNTER — Encounter: Payer: Self-pay | Admitting: Internal Medicine

## 2015-05-10 ENCOUNTER — Telehealth: Payer: Self-pay | Admitting: *Deleted

## 2015-05-10 ENCOUNTER — Ambulatory Visit (INDEPENDENT_AMBULATORY_CARE_PROVIDER_SITE_OTHER): Payer: Medicare Other | Admitting: Internal Medicine

## 2015-05-10 ENCOUNTER — Ambulatory Visit: Payer: Medicare Other | Admitting: Dietician

## 2015-05-10 VITALS — BP 147/73 | HR 85 | Temp 98.6°F | Wt 229.6 lb

## 2015-05-10 DIAGNOSIS — E039 Hypothyroidism, unspecified: Secondary | ICD-10-CM | POA: Diagnosis not present

## 2015-05-10 DIAGNOSIS — E119 Type 2 diabetes mellitus without complications: Secondary | ICD-10-CM | POA: Diagnosis not present

## 2015-05-10 DIAGNOSIS — Z940 Kidney transplant status: Secondary | ICD-10-CM

## 2015-05-10 DIAGNOSIS — I8312 Varicose veins of left lower extremity with inflammation: Secondary | ICD-10-CM | POA: Diagnosis not present

## 2015-05-10 DIAGNOSIS — I10 Essential (primary) hypertension: Secondary | ICD-10-CM

## 2015-05-10 DIAGNOSIS — I8311 Varicose veins of right lower extremity with inflammation: Secondary | ICD-10-CM

## 2015-05-10 DIAGNOSIS — E1143 Type 2 diabetes mellitus with diabetic autonomic (poly)neuropathy: Secondary | ICD-10-CM | POA: Diagnosis present

## 2015-05-10 DIAGNOSIS — I714 Abdominal aortic aneurysm, without rupture, unspecified: Secondary | ICD-10-CM

## 2015-05-10 DIAGNOSIS — Z794 Long term (current) use of insulin: Secondary | ICD-10-CM

## 2015-05-10 DIAGNOSIS — I8310 Varicose veins of unspecified lower extremity with inflammation: Secondary | ICD-10-CM | POA: Diagnosis not present

## 2015-05-10 DIAGNOSIS — Z79899 Other long term (current) drug therapy: Secondary | ICD-10-CM | POA: Diagnosis not present

## 2015-05-10 LAB — GLUCOSE, CAPILLARY: Glucose-Capillary: 70 mg/dL (ref 65–99)

## 2015-05-10 LAB — POCT GLYCOSYLATED HEMOGLOBIN (HGB A1C): HEMOGLOBIN A1C: 7

## 2015-05-10 LAB — HM DIABETES EYE EXAM

## 2015-05-10 MED ORDER — ACCU-CHEK AVIVA DEVI: Status: AC

## 2015-05-10 MED ORDER — HYDROCODONE-ACETAMINOPHEN 5-325 MG PO TABS: 1.0000 | ORAL_TABLET | Freq: Four times a day (QID) | ORAL | Status: DC | PRN

## 2015-05-10 MED ORDER — GLUCOSE BLOOD VI STRP: ORAL_STRIP | Status: DC

## 2015-05-10 NOTE — Assessment & Plan Note (Signed)
Patient follows with Dr. Nicola Girt at Vascular Surgery at [facility]. CT ordered for 06/17/15 and appointment at that time as well.

## 2015-05-10 NOTE — Progress Notes (Signed)
Retinal images were taken and transmitted. Patient informed Probation officer that she has poor vision, would like to be evaluated for glasses. She also said that she has seen Dr. Silvestre Gunner in the past who was treating her with injections for about 1 year.

## 2015-05-10 NOTE — Assessment & Plan Note (Signed)
Patient has a 3 month history of posterior distal lower extremity pain, edema, tightness, hyperpigmentation. DVT and cellulitis were ruled out. Pain has persisted despite compression stockings. 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 Both would be supportive treatment- pain control, compression stocking, wound care. I will talk to her Nephrologist about the possibility of calciphylaxis and see if patient would benefit from phosphate/calcium binders. She is already on statin therapy. Dermatology recently started her on pentoxifylline 400 mg BID to increase blood flow to the affected area.   Plan: -Norco 5-325 1-2 pills Q6H prn #180 (gave 2 additional refills) -Compression stockings -Pentoxifylline 400 mg BID -Wound care as needed -Follow up with Dermatology  -Will touch base with Dr. Franchot Gallo concerning possibility of calciphylaxis, unfortunately, there would not be much more we could do than what we are already doing.

## 2015-05-10 NOTE — Progress Notes (Signed)
Subjective:    Patient ID: [name], female    DOB: August 22, 1945, 69 y.o.   MRN: [id number]  HPI [name] is a 68 y.o. female with PMHx of T2DM, HTN, renal transplant, hypothyroidism who presents to the clinic for follow up for T2DM. Please see A&P for the status of the patient's chronic medical problems.   Past Medical History  Diagnosis Date  . Diabetes mellitus   . Hypertension   . Renal disorder   . Morbid obesity (HCC)     BMI 34  . Allergy   . Blood transfusion without reported diagnosis   . Cataract   . Clotting disorder ([place])   . Thyroid disease   . Ulcer     Outpatient Encounter Prescriptions as of 05/10/2015  Medication Sig Note  . ACCU-CHEK SOFTCLIX LANCETS lancets Check blood sugar up to 3 times a day  as instructed   . aspirin EC 81 MG tablet Take 1 tablet (81 mg total) by mouth every evening.   . Blood Glucose Monitoring Suppl (ACCU-CHEK AVIVA PLUS) W/DEVICE KIT 1 Device by Does not apply route 2 (two) times daily.   . Cholecalciferol 1000 UNITS tablet Take 1,000 Units by mouth. 07/21/2014: Received from: [facility]  . Elastic Bandages & Supports (MEDICAL COMPRESSION STOCKINGS) MISC 2 Devices by Does not apply route daily.   . furosemide (LASIX) 40 MG tablet Take 1 tablet (40 mg total) by mouth daily.   Marland Kitchen glucose blood (ACCU-CHEK AVIVA) test strip Use to test blood sugars three times a day. Dx code:E11.43.   . HYDROcodone-acetaminophen (NORCO/VICODIN) 5-325 MG per tablet Take 2 tablets by mouth every 6 (six) hours as needed for moderate pain.   Marland Kitchen insulin aspart (NOVOLOG FLEXPEN) 100 UNIT/ML FlexPen Inject into the skin 6 units with your largest meal of the day- lunch OR dinner.   . Insulin Glargine (LANTUS) 100 UNIT/ML Solostar Pen Inject 35 Units into the skin daily at 10 pm.   . Insulin Pen Needle 32G X 4 MM MISC Use to give insulin 4 times a day. Dx code: E11.43.   Marland Kitchen levothyroxine (SYNTHROID, LEVOTHROID) 125 MCG tablet  take 1 tablet by mouth every morning ON AN EMPTY STOMACH BEFORE BREAKFAST   . Magnesium 500 MG TABS Take 1,000 mg by mouth daily.    . Multiple Vitamin (MULTIVITAMIN WITH MINERALS) TABS tablet Take 1 tablet by mouth daily.   . mycophenolate (CELLCEPT) 250 MG capsule Take 2 capsules (500 mg total) by mouth 2 (two) times daily.   . pantoprazole (PROTONIX) 40 MG tablet Take 1 tablet (40 mg total) by mouth daily.   . potassium chloride (KLOR-CON) 8 MEQ tablet Take 8 mEq by mouth daily.   . pravastatin (PRAVACHOL) 40 MG tablet Take 1 tablet (40 mg total) by mouth every evening.   . predniSONE (DELTASONE) 5 MG tablet Take 5 mg by mouth daily.   . sodium bicarbonate 650 MG tablet Take 650 mg by mouth as needed for heartburn.   . sulfamethoxazole-trimethoprim (BACTRIM,SEPTRA) 400-80 MG per tablet Take 1 tablet by mouth every Monday, Wednesday, and Friday.   . tacrolimus (PROGRAF) 0.5 MG capsule Take 0.5 mg by mouth 2 (two) times daily.   . tacrolimus (PROGRAF) 1 MG capsule Take 4 capsules (4 mg total) by mouth 2 (two) times daily. Takes 4.65m total twice daily    No facility-administered encounter medications on file as of 05/10/2015.    No family history on file.  Social History   Social History  . Marital Status: Divorced    Spouse Name: N/A  . Number of Children: N/A  . Years of Education: 2 yrs coll   Occupational History  . Not on file.   Social History Main Topics  . Smoking status: Former Smoker    Quit date: 03/02/2009  . Smokeless tobacco: Not on file     Comment: Quit x 20yr.  . Alcohol Use: 0.0 oz/week    0 Standard drinks or equivalent per week     Comment: Wine sometimes.  . Drug Use: No  . Sexual Activity: Not on file   Other Topics Concern  . Not on file   Social History Narrative   Review of Systems General: Denies fever, chills, fatigue, change in appetite Respiratory: Denies SOB, cough, DOE.   Cardiovascular: Denies chest pain and palpitations.    Gastrointestinal: Denies nausea, vomiting, abdominal pain Endocrine: Denies polyuria, and polydipsia. Musculoskeletal: Admits to bilateral lower extremity pain, tightness, warmth. Denies back pain, joint swelling, arthralgias.  Skin: Admits to skin changes in posterior legs. Neurological: Denies dizziness, headaches, weakness, lightheadedness, numbness     Objective:   Physical Exam Filed Vitals:   05/10/15 1026  BP: 147/73  Pulse: 85  Temp: 98.6 F (37 C)  TempSrc: Oral  Weight: 229 lb 9.6 oz (104.146 kg)  SpO2: 99%   General: Vital signs reviewed.  Patient is well-developed and well-nourished, in no acute distress and cooperative with exam.  Cardiovascular: RRR, S1 normal, S2 normal, no murmurs, gallops, or rubs. Pulmonary/Chest: Clear to auscultation bilaterally, no wheezes, rales, or rhonchi. Abdominal: Soft, non-tender, non-distended, BS + Extremities: Bilateral distal lower extremity tenderness, hyperpigmentation, tightness. Skin is dark, smooth, shiny. No evidence of ulceration or abscesses. No evidence of infection. Area is confined to posterior inferior calves. Pulses symmetric and intact bilaterally. No cyanosis or clubbing. Psychiatric: Normal mood and affect. speech and behavior is normal. Cognition and memory are normal.          Assessment & Plan:   Please see problem based assessment and plan.

## 2015-05-10 NOTE — Patient Instructions (Signed)
FOR YOUR DIABETES: CONTINUE TAKING LANTUS 35 UNITS ONCE A DAY AND NOVOLOG 6 UNITS BEFORE THE LARGEST MEAL OF THE DAY.  CHECK YOUR BLOOD SUGAR 3 TIMES A DAY, ESPECIALLY IN THE MORNING WHEN YOU WAKE UP BEFORE YOU EAT.  IF YOUR BLOOD SUGARS ARE EVER LESS THAN 60, PLEASE CALL THE CLINIC AND MAKE ME AWARE.

## 2015-05-10 NOTE — Telephone Encounter (Signed)
Pt seen earlier today at [facility], she called back to inform us that the pain med (norco 5-.325mg ) prescribed earlier today is not working. States she took two and doesn't fill her pain is any better.  Pt advised to take medication for another 1-2 days and contact the clinic if her pain doesn't get any better or worsens. Pt agreed and pt's pcp aware.Despina Hidden Cassady11/15/20164:19 PM

## 2015-05-10 NOTE — Assessment & Plan Note (Signed)
Lab Results  Component Value Date   HGBA1C 7.0 05/10/2015   HGBA1C 6.7 02/23/2015   HGBA1C 6.9 09/22/2014     Assessment: Diabetes control:  Controlled Progress toward A1C goal:    Slight deterioration, but at goal Comments: Compliant with Lantus 35 units daily and Novolog 6 units before the largest meal of the day. She is not checking her sugars. CBG 70 today, she has not eaten. She denies symptoms of hypoglycemia. Patient states she does not have her meter or strips.   Plan: Medications:  continue current medications Home glucose monitoring: Frequency:  TID Timing:  ACHS Instruction/counseling given: reminded to bring blood glucose meter & log to each visit Other plans: Refill strips and meter. Patient will call if she has glucose readings in the 60-70s.

## 2015-05-10 NOTE — Assessment & Plan Note (Signed)
BP Readings from Last 3 Encounters:  05/10/15 147/73  02/23/15 137/72  10/11/14 155/84    Lab Results  Component Value Date   NA 136 09/22/2014   K 4.7 09/22/2014   CREATININE 1.53* 09/22/2014    Assessment: Blood pressure control:  Slightly above goal Progress toward BP goal:   Deteriorated Comments: Compliant with Lasix 40 mg daily  Plan: Medications:  continue current medications Other plans: Follow up 3 months

## 2015-05-10 NOTE — Assessment & Plan Note (Signed)
Patient is complaint with Synthroid 125 mcg daily.  Plan: -Recheck TSH level today

## 2015-05-11 ENCOUNTER — Other Ambulatory Visit: Payer: Self-pay | Admitting: Internal Medicine

## 2015-05-11 DIAGNOSIS — K219 Gastro-esophageal reflux disease without esophagitis: Secondary | ICD-10-CM

## 2015-05-11 LAB — TSH: TSH: 2.33 u[IU]/mL (ref 0.450–4.500)

## 2015-05-11 MED ORDER — PANTOPRAZOLE SODIUM 40 MG PO TBEC: 40.0000 mg | DELAYED_RELEASE_TABLET | Freq: Every day | ORAL | Status: DC

## 2015-05-11 NOTE — Telephone Encounter (Signed)
Pt requesting protonix to be filled.

## 2015-05-11 NOTE — Telephone Encounter (Signed)
Spoke with patient to inform she should have refills.  Pt stating pharmacy has no more refills.  Talked to [facility].  They never received the RX that was written 07/21/14 so rx on file is expired.    Can you write new RX?  Pharmacy not wanting to take the one from Jan.

## 2015-05-18 NOTE — Progress Notes (Signed)
Internal Medicine Clinic Attending  Case discussed with Dr. Richardson soon after the resident saw the patient.  We reviewed the resident's history and exam and pertinent patient test results.  I agree with the assessment, diagnosis, and plan of care documented in the resident's note. 

## 2015-06-08 ENCOUNTER — Encounter: Payer: Self-pay | Admitting: Dietician

## 2015-06-16 ENCOUNTER — Other Ambulatory Visit: Payer: Self-pay | Admitting: *Deleted

## 2015-06-16 DIAGNOSIS — E1143 Type 2 diabetes mellitus with diabetic autonomic (poly)neuropathy: Secondary | ICD-10-CM

## 2015-06-16 NOTE — Addendum Note (Signed)
Addended by: Orson Gear on: 06/16/2015 03:23 PM   Modules accepted: Orders

## 2015-06-16 NOTE — Addendum Note (Signed)
Addended by: Orson Gear on: 06/16/2015 03:24 PM   Modules accepted: Orders

## 2015-06-24 DIAGNOSIS — Z940 Kidney transplant status: Secondary | ICD-10-CM | POA: Diagnosis not present

## 2015-06-24 DIAGNOSIS — K449 Diaphragmatic hernia without obstruction or gangrene: Secondary | ICD-10-CM | POA: Diagnosis not present

## 2015-06-24 DIAGNOSIS — I714 Abdominal aortic aneurysm, without rupture: Secondary | ICD-10-CM | POA: Diagnosis not present

## 2015-06-24 DIAGNOSIS — R160 Hepatomegaly, not elsewhere classified: Secondary | ICD-10-CM | POA: Diagnosis not present

## 2015-06-25 ENCOUNTER — Other Ambulatory Visit: Payer: Self-pay | Admitting: Internal Medicine

## 2015-06-27 ENCOUNTER — Other Ambulatory Visit: Payer: Self-pay | Admitting: Internal Medicine

## 2015-06-30 DIAGNOSIS — Z23 Encounter for immunization: Secondary | ICD-10-CM | POA: Diagnosis not present

## 2015-07-20 DIAGNOSIS — Z0100 Encounter for examination of eyes and vision without abnormal findings: Secondary | ICD-10-CM | POA: Diagnosis not present

## 2015-07-20 DIAGNOSIS — H353123 Nonexudative age-related macular degeneration, left eye, advanced atrophic without subfoveal involvement: Secondary | ICD-10-CM | POA: Diagnosis not present

## 2015-07-20 DIAGNOSIS — H353113 Nonexudative age-related macular degeneration, right eye, advanced atrophic without subfoveal involvement: Secondary | ICD-10-CM | POA: Diagnosis not present

## 2015-07-20 LAB — HM DIABETES EYE EXAM

## 2015-08-04 DIAGNOSIS — I8310 Varicose veins of unspecified lower extremity with inflammation: Secondary | ICD-10-CM | POA: Diagnosis not present

## 2015-08-04 DIAGNOSIS — L810 Postinflammatory hyperpigmentation: Secondary | ICD-10-CM | POA: Diagnosis not present

## 2015-08-24 ENCOUNTER — Encounter: Payer: Self-pay | Admitting: *Deleted

## 2015-08-29 ENCOUNTER — Other Ambulatory Visit: Payer: Self-pay | Admitting: Internal Medicine

## 2015-08-29 NOTE — Telephone Encounter (Signed)
REFILL ON BD Cole

## 2015-08-30 MED ORDER — INSULIN PEN NEEDLE 32G X 4 MM MISC: Status: DC

## 2015-08-30 NOTE — Telephone Encounter (Signed)
Pt calling about pen needles states that she only has one left.

## 2015-08-31 ENCOUNTER — Telehealth: Payer: Self-pay | Admitting: Internal Medicine

## 2015-08-31 NOTE — Telephone Encounter (Signed)
error 

## 2015-09-02 DIAGNOSIS — E119 Type 2 diabetes mellitus without complications: Secondary | ICD-10-CM | POA: Diagnosis not present

## 2015-09-02 DIAGNOSIS — Z79899 Other long term (current) drug therapy: Secondary | ICD-10-CM | POA: Diagnosis not present

## 2015-09-02 DIAGNOSIS — N183 Chronic kidney disease, stage 3 (moderate): Secondary | ICD-10-CM | POA: Diagnosis not present

## 2015-09-02 DIAGNOSIS — Z940 Kidney transplant status: Secondary | ICD-10-CM | POA: Diagnosis not present

## 2015-09-14 ENCOUNTER — Other Ambulatory Visit: Payer: Self-pay | Admitting: Internal Medicine

## 2015-09-15 ENCOUNTER — Telehealth: Payer: Self-pay | Admitting: Dietician

## 2015-09-15 NOTE — Telephone Encounter (Signed)
Butch Penny,  Thank you for taking her phone call. I faxed the paperwork to Dr. Hoyt Koch on Wednesday 09/13/15 approving her for her teeth extractions. Will you please let her know? They should have her paperwork, so she can call and confirm. I will also call her and let her know. She does not need to make an appointment with me for the medical clearance.  Thank you! [place]

## 2015-09-15 NOTE — Telephone Encounter (Signed)
[name] needs paperwork from Dr. Quay Burow for medical clearance so that Dr.Jenson can pull have her teeth. She had called about her paperwork earlier and was told it was lost. She called and had Dr. Lupita Leash office refax it. She made an appointment with Dr. Quay Burow the earliest she could for April 19th to follow up. She is willing to see another doctor for the medical clearance if that will help her to get her paper work and her teeth pulled sooner.   She'd like to know if Dr. Quay Burow got the papers? And  How can she get clearance she needs to have her teeth pulled?

## 2015-09-16 NOTE — Telephone Encounter (Signed)
Patient notified

## 2015-09-19 ENCOUNTER — Other Ambulatory Visit: Payer: Self-pay | Admitting: Internal Medicine

## 2015-09-19 MED ORDER — AMOXICILLIN 500 MG PO TABS: ORAL_TABLET | ORAL | Status: DC

## 2015-09-30 ENCOUNTER — Other Ambulatory Visit: Payer: Self-pay | Admitting: Dietician

## 2015-09-30 MED ORDER — INSULIN GLARGINE 100 UNIT/ML SOLOSTAR PEN: 35.0000 [IU] | PEN_INJECTOR | Freq: Every day | SUBCUTANEOUS | Status: DC

## 2015-09-30 NOTE — Telephone Encounter (Signed)
Requesting a referral for the low vision  Program through [facility]. Dr. Claudean Kinds said he was going to refer her (severe macular degeneration) and she has not heard from them. Their phone number [place] [address] [place], [place], [phone].  Need lantus ASAP- will be out this Sunday, confirmed dose and pharmacy

## 2015-09-30 NOTE — Telephone Encounter (Signed)
Refilled Lantus and let patient know.  Called the Low vision program at [phone]. The referral coordinator was out of the office, but the secretary will have her call the patient later today. I informed Ms. Furnish about this aw ell.   Martyn Malay, DO PGY-2 Internal Medicine Resident Pager # [phone] 09/30/2015 9:40 AM

## 2015-10-10 DIAGNOSIS — I714 Abdominal aortic aneurysm, without rupture: Secondary | ICD-10-CM | POA: Diagnosis not present

## 2015-10-10 DIAGNOSIS — Z79899 Other long term (current) drug therapy: Secondary | ICD-10-CM | POA: Diagnosis not present

## 2015-10-10 DIAGNOSIS — E1310 Other specified diabetes mellitus with ketoacidosis without coma: Secondary | ICD-10-CM | POA: Diagnosis not present

## 2015-10-10 DIAGNOSIS — N183 Chronic kidney disease, stage 3 (moderate): Secondary | ICD-10-CM | POA: Diagnosis not present

## 2015-10-10 DIAGNOSIS — N186 End stage renal disease: Secondary | ICD-10-CM | POA: Diagnosis not present

## 2015-10-10 DIAGNOSIS — Z940 Kidney transplant status: Secondary | ICD-10-CM | POA: Diagnosis not present

## 2015-10-10 DIAGNOSIS — I120 Hypertensive chronic kidney disease with stage 5 chronic kidney disease or end stage renal disease: Secondary | ICD-10-CM | POA: Diagnosis not present

## 2015-10-10 DIAGNOSIS — Z95828 Presence of other vascular implants and grafts: Secondary | ICD-10-CM | POA: Diagnosis not present

## 2015-10-10 DIAGNOSIS — Z86718 Personal history of other venous thrombosis and embolism: Secondary | ICD-10-CM | POA: Diagnosis not present

## 2015-10-10 DIAGNOSIS — E039 Hypothyroidism, unspecified: Secondary | ICD-10-CM | POA: Diagnosis not present

## 2015-10-10 DIAGNOSIS — D899 Disorder involving the immune mechanism, unspecified: Secondary | ICD-10-CM | POA: Diagnosis not present

## 2015-10-10 DIAGNOSIS — Z4822 Encounter for aftercare following kidney transplant: Secondary | ICD-10-CM | POA: Diagnosis not present

## 2015-10-10 DIAGNOSIS — E876 Hypokalemia: Secondary | ICD-10-CM | POA: Diagnosis not present

## 2015-10-10 DIAGNOSIS — Z8719 Personal history of other diseases of the digestive system: Secondary | ICD-10-CM | POA: Diagnosis not present

## 2015-10-10 DIAGNOSIS — Z862 Personal history of diseases of the blood and blood-forming organs and certain disorders involving the immune mechanism: Secondary | ICD-10-CM | POA: Diagnosis not present

## 2015-10-10 DIAGNOSIS — N281 Cyst of kidney, acquired: Secondary | ICD-10-CM | POA: Diagnosis not present

## 2015-10-10 DIAGNOSIS — E1322 Other specified diabetes mellitus with diabetic chronic kidney disease: Secondary | ICD-10-CM | POA: Diagnosis not present

## 2015-10-10 DIAGNOSIS — I129 Hypertensive chronic kidney disease with stage 1 through stage 4 chronic kidney disease, or unspecified chronic kidney disease: Secondary | ICD-10-CM | POA: Diagnosis not present

## 2015-10-12 ENCOUNTER — Encounter: Payer: Medicare Other | Admitting: Internal Medicine

## 2015-10-28 ENCOUNTER — Other Ambulatory Visit: Payer: Self-pay

## 2015-10-28 DIAGNOSIS — Z1231 Encounter for screening mammogram for malignant neoplasm of breast: Secondary | ICD-10-CM

## 2015-12-23 DIAGNOSIS — I714 Abdominal aortic aneurysm, without rupture: Secondary | ICD-10-CM | POA: Diagnosis not present

## 2015-12-23 DIAGNOSIS — Z95828 Presence of other vascular implants and grafts: Secondary | ICD-10-CM | POA: Diagnosis not present

## 2016-01-03 ENCOUNTER — Ambulatory Visit: Payer: Medicare Other

## 2016-01-17 ENCOUNTER — Encounter: Payer: Medicare Other | Admitting: Internal Medicine

## 2016-03-05 ENCOUNTER — Telehealth: Payer: Self-pay | Admitting: Internal Medicine

## 2016-03-05 NOTE — Telephone Encounter (Signed)
Attempted to contact patient today about making an appt for HM, diabetic and eye exam.  Spoke to her son and informed pt is out of town will be returning on 03-16-16.  Will make a note to call patient back on 03-19-16.

## 2016-03-20 ENCOUNTER — Ambulatory Visit: Payer: Medicare Other | Admitting: Occupational Therapy

## 2016-03-23 DIAGNOSIS — Z940 Kidney transplant status: Secondary | ICD-10-CM | POA: Diagnosis not present

## 2016-03-23 DIAGNOSIS — Z862 Personal history of diseases of the blood and blood-forming organs and certain disorders involving the immune mechanism: Secondary | ICD-10-CM | POA: Diagnosis not present

## 2016-03-23 DIAGNOSIS — I10 Essential (primary) hypertension: Secondary | ICD-10-CM | POA: Diagnosis not present

## 2016-03-23 DIAGNOSIS — N183 Chronic kidney disease, stage 3 (moderate): Secondary | ICD-10-CM | POA: Diagnosis not present

## 2016-03-27 ENCOUNTER — Other Ambulatory Visit: Payer: Self-pay | Admitting: Internal Medicine

## 2016-03-27 DIAGNOSIS — E1143 Type 2 diabetes mellitus with diabetic autonomic (poly)neuropathy: Secondary | ICD-10-CM

## 2016-04-05 ENCOUNTER — Telehealth: Payer: Self-pay | Admitting: Internal Medicine

## 2016-04-05 NOTE — Telephone Encounter (Signed)
APT. REMINDER CALL, NO ANSWER, NO VOICEMAIL °

## 2016-04-06 ENCOUNTER — Encounter: Payer: Self-pay | Admitting: Pharmacist

## 2016-04-06 ENCOUNTER — Ambulatory Visit (INDEPENDENT_AMBULATORY_CARE_PROVIDER_SITE_OTHER): Payer: Medicare Other | Admitting: Internal Medicine

## 2016-04-06 DIAGNOSIS — M542 Cervicalgia: Secondary | ICD-10-CM

## 2016-04-06 DIAGNOSIS — Z87891 Personal history of nicotine dependence: Secondary | ICD-10-CM | POA: Diagnosis not present

## 2016-04-06 DIAGNOSIS — M62838 Other muscle spasm: Secondary | ICD-10-CM | POA: Insufficient documentation

## 2016-04-06 MED ORDER — CYCLOBENZAPRINE HCL 5 MG PO TABS: 5.0000 mg | ORAL_TABLET | Freq: Three times a day (TID) | ORAL | 0 refills | Status: DC | PRN

## 2016-04-06 NOTE — Assessment & Plan Note (Addendum)
Symptoms and exam consistent with muscle spasm; will treat symptomatically.   Plan: --heat --Tylenol 325 q4hrs. --Flexeril 5mg  q8hrs PRN; advised about increased sedation --f/u at PCP visit on oct 25th

## 2016-04-06 NOTE — Patient Instructions (Signed)
For your muscle spasm: --apply heat to the area to relax the muscles --Tylenol 325mg  every 4 hours as needed (don't take more than 3000mg  in a day) --I sent in a prescription for Flexeril, a muscle relaxant. You can take it every 8 hours if the heat and tylenol are not enough. Just be careful as it may give you some drowsiness.  Keep your appointment with Dr. Quay Burow on Oct 25th.

## 2016-04-06 NOTE — Progress Notes (Signed)
   CC: neck pain  HPI:  [name] is a 70 y.o. with a PMH listed below presents with intermittent right sided neck pain since June. The pain first began when she turned her head while lying down back in June; at that time she had some decreased ROM associated with the pain. She has used rubbing alcohol and intermittent tylenol for the pain without much relief. The pain is relieved on its own after a few days, but then would start again if she lifted anything heavier than 5lbs. She denies radiation into her arm, and denies numbness, tingling, swelling, redness or temperature change in her R arm. She denies trauma to the neck.  See's transplant clinic next week See's Dr. Quay Burow 10/25 See's Dermatologist in Dec  Please see problem based Assessment and Plan for status of patients chronic conditions.  Past Medical History:  Diagnosis Date  . Allergy   . Blood transfusion without reported diagnosis   . Cataract   . Clotting disorder ([place])   . Diabetes mellitus   . Hypertension   . Morbid obesity (HCC)    BMI 34  . Renal disorder   . Thyroid disease   . Ulcer ([place])     Review of Systems:   Review of Systems  Musculoskeletal: Positive for neck pain (nonradiating).  Neurological: Positive for headaches (mild, associated with neck pain). Negative for tingling, sensory change and focal weakness.    Physical Exam:  Vitals:   04/06/16 1027  BP: 136/73  Pulse: 76  Temp: 98 F (36.7 C)  TempSrc: Oral  Weight: 222 lb 9.6 oz (101 kg)  Height: 5\' 8"  (1.727 m)   Physical Exam  Constitutional: NAD, pleasant ENT: no lymphadenopathy CV: RRR, no murmurs, rubs or gallops, 2+ pulses throughout Resp: CTAB, no increased work of breathing, no crackles or wheezing Abd: soft, +BS, NDNT MSK: no tenderness along cervical spine, muscle spasm in R trapezius associated with tenderness; full ROM of neck, full ROM of bil UE, strength 5/5 bil UE, sensation intact. No tenderness over bony  prominences of the R shoulder.  Assessment & Plan:   See Encounters Tab for problem based charting.   Patient seen with Dr. Carmel [place], MD Internal Medicine PGY1

## 2016-04-09 NOTE — Progress Notes (Signed)
Internal Medicine Clinic Attending  I saw and evaluated the patient.  I personally confirmed the key portions of the history and exam documented by Dr. Svalina and I reviewed pertinent patient test results.  The assessment, diagnosis, and plan were formulated together and I agree with the documentation in the resident's note.  

## 2016-04-11 ENCOUNTER — Other Ambulatory Visit: Payer: Self-pay

## 2016-04-11 NOTE — Telephone Encounter (Signed)
Pt needs to speak with a nurse regarding cyclobenzaprine.

## 2016-04-12 NOTE — Telephone Encounter (Signed)
Yes I agree, she should only be taking them AS NEEDED.

## 2016-04-12 NOTE — Telephone Encounter (Signed)
Called pt, she states she has been taking 3 flexeril daily and it makes her feel horrible, she is tired and her stomach feels a little bad, she is advised to only take as needed not 3 times daily everyday, she states she has not taken any today and feels better, states she will take 1 tonight and call back tomorrow for an update, agreed

## 2016-04-17 ENCOUNTER — Telehealth: Payer: Self-pay | Admitting: Internal Medicine

## 2016-04-17 ENCOUNTER — Ambulatory Visit: Payer: Medicare Other | Attending: Ophthalmology | Admitting: Occupational Therapy

## 2016-04-17 DIAGNOSIS — H53413 Scotoma involving central area, bilateral: Secondary | ICD-10-CM | POA: Insufficient documentation

## 2016-04-17 NOTE — Therapy (Signed)
[facility] [address] [place], [place], [address] Phone: [phone]   Fax:  [phone]  Occupational Therapy Evaluation  Patient Details  Name: [name] MRN: [id number] Date of Birth: 1946-04-02 Referring Provider: Dr. Claudean Kinds  Encounter Date: 04/17/2016      OT End of Session - 04/17/16 1343    Visit Number 1   Number of Visits 1   Date for OT Re-Evaluation 05/17/16   Authorization Type Medicare/ MCD   OT Start Time 1325   OT Stop Time 1425   OT Time Calculation (min) 60 min   Activity Tolerance Patient tolerated treatment well   Behavior During Therapy [facility] for tasks assessed/performed      Past Medical History:  Diagnosis Date  . Allergy   . Blood transfusion without reported diagnosis   . Cataract   . Clotting disorder ([place])   . Diabetes mellitus   . Hypertension   . Morbid obesity (HCC)    BMI 34  . Renal disorder   . Thyroid disease   . Ulcer [facility])     Past Surgical History:  Procedure Laterality Date  . ABDOMINAL AORTIC ANEURYSM REPAIR    . KIDNEY TRANSPLANT      There were no vitals filed for this visit.         [facility] OT Assessment - 04/17/16 1328      Assessment   Diagnosis macular degeneration   Referring Provider Dr. Claudean Kinds   Onset Date --  2008     Precautions   Precautions Fall     Balance Screen   Has the patient fallen in the past 6 months No   Has the patient had a decrease in activity level because of a fear of falling?  No   Is the patient reluctant to leave their home because of a fear of falling?  No     Home  Environment   Family/patient expects to be discharged to: Private residence   [place] One level   Lives With Son     Prior Function   Level of [place] with basic ADLs   Vocation On disability   Leisure reading     ADL   ADL comments modified indpendent with basic ADLs     IADL   Shopping Assistance for transportation   [place] alone or with occasional assistance   Meal Prep Able to complete simple warm meal prep   Medication Management Is responsible for taking medication in correct dosages at correct time   Financial Management Manages financial matters independently (budgets, writes checks, pays rent, bills goes to bank), collects and keeps track of income     Mobility   Mobility Status Independent     Vision - History   Baseline Vision Wears glasses all the time   Visual History Macular degeneration     Vision Assessment   Vision Assessment Vision tested   Visual Acuity Per MD/OD report   Per MD/OD Report OD 20/100, OD 20/100   Reading Acuity 20/125   Patient has diffculty with activities due to visual impairment Adjusting stove/washing machine dials;Writing checks;Reading bills   Comment Pt reports blurry vision or letters are missing     Cognition   Overall Cognitive Status Within Functional Limits for tasks assessed   Mini Mental State Exam  29/30  OT Treatments/Exercises (OP) - 05-13-2016 0001      ADLs   ADL Comments Pt was educated regarding use of 3x stand/ handheld magnifiers and 4x handheld magnifier. Pt demonstrates ability to read continuous text with 4x magnifier following instruction. Pt was also instructed in eccentric viewing, use of line guide for check, hi marks for stove  and larger pill box. Pt was provided with information for purchase.               OT Education - 05-13-2016 1436    Education provided Yes   Education Details use of 4x handheld magnifier, eccentic veiwing, line guide for checks and hi marks for stove   Person(s) Educated Patient   Methods Explanation;Demonstration;Verbal cues;Handout   Comprehension Verbalized understanding;Returned demonstration;Verbal cues required                    Plan - 13-May-2016 1429    Clinical Impression Statement Pt  is a 70 y.o. female with macular degeneration who presents with visual impairments which impede performance of ALs/ IADLs. Pt can benefit from skilled occupational therapy to maximize functional independnce with ADLS, IADLS and reading ability.   Rehab Potential Good   OT Frequency One time visit   OT Duration 8 weeks   OT Treatment/Interventions Self-care/ADL training;DME and/or AE instruction;Patient/family education;Visual/perceptual remediation/compensation   Plan Pt was provided with treatment/ education on day of evaluation and no additional visits were recommended. therefore no goals were set. Pt demonstrates understanding of all education.   Consulted and Agree with Plan of Care Patient      Patient will benefit from skilled therapeutic intervention in order to improve the following deficits and impairments:  Impaired vision/preception  Visit Diagnosis: [place] involving central area, bilateral      G-Codes - 2016/05/13 1429    Functional Assessment Tool Used clinical impressions   Functional Limitation Self care   Self Care Current Status (M0867) At least 1 percent but less than 20 percent impaired, limited or restricted   Self Care Goal Status (Y1950) At least 1 percent but less than 20 percent impaired, limited or restricted   Self Care Discharge Status [id number]) At least 1 percent but less than 20 percent impaired, limited or restricted      Problem List Patient Active Problem List   Diagnosis Date Noted  . Muscle spasm of right shoulder 04/06/2016  . Lipodermatosclerosis 05/10/2015  . Lower extremity pain, bilateral 02/24/2015  . Dental caries 06/24/2014  . Health care maintenance 06/08/2014  . Hypothyroidism 06/01/2014  . Hypertension 06/01/2014  . Hepatic lesion 08/06/2013  . Diabetic neuropathy ([place]) 05/08/2013  . History of kidney transplant 05/08/2013  . Dyslipidemia 07/30/2011  . AAA (abdominal aortic aneurysm) without rupture ([place]) 07/09/2011  . 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 [place], [place], [address] Phone: [phone]   Fax:  [phone]  Name: [name] MRN: [id number] Date of Birth: 1946/03/14

## 2016-04-17 NOTE — Telephone Encounter (Signed)
AT. REMINDER CALL, NO ANSWER, NO VOICEMAIL °

## 2016-04-18 ENCOUNTER — Ambulatory Visit (INDEPENDENT_AMBULATORY_CARE_PROVIDER_SITE_OTHER): Payer: Medicare Other | Admitting: Internal Medicine

## 2016-04-18 ENCOUNTER — Encounter: Payer: Self-pay | Admitting: Internal Medicine

## 2016-04-18 VITALS — BP 129/68 | HR 86 | Temp 97.7°F | Ht 68.0 in | Wt 224.3 lb

## 2016-04-18 DIAGNOSIS — I10 Essential (primary) hypertension: Secondary | ICD-10-CM | POA: Diagnosis not present

## 2016-04-18 DIAGNOSIS — I714 Abdominal aortic aneurysm, without rupture: Secondary | ICD-10-CM | POA: Diagnosis not present

## 2016-04-18 DIAGNOSIS — R21 Rash and other nonspecific skin eruption: Secondary | ICD-10-CM | POA: Diagnosis not present

## 2016-04-18 DIAGNOSIS — Z794 Long term (current) use of insulin: Secondary | ICD-10-CM | POA: Diagnosis not present

## 2016-04-18 DIAGNOSIS — M62838 Other muscle spasm: Secondary | ICD-10-CM | POA: Diagnosis not present

## 2016-04-18 DIAGNOSIS — Z23 Encounter for immunization: Secondary | ICD-10-CM

## 2016-04-18 DIAGNOSIS — I8311 Varicose veins of right lower extremity with inflammation: Secondary | ICD-10-CM

## 2016-04-18 DIAGNOSIS — E1143 Type 2 diabetes mellitus with diabetic autonomic (poly)neuropathy: Secondary | ICD-10-CM

## 2016-04-18 DIAGNOSIS — Z940 Kidney transplant status: Secondary | ICD-10-CM | POA: Diagnosis not present

## 2016-04-18 DIAGNOSIS — E039 Hypothyroidism, unspecified: Secondary | ICD-10-CM | POA: Diagnosis not present

## 2016-04-18 DIAGNOSIS — Z87891 Personal history of nicotine dependence: Secondary | ICD-10-CM | POA: Diagnosis not present

## 2016-04-18 DIAGNOSIS — I8312 Varicose veins of left lower extremity with inflammation: Secondary | ICD-10-CM

## 2016-04-18 DIAGNOSIS — Z1211 Encounter for screening for malignant neoplasm of colon: Secondary | ICD-10-CM

## 2016-04-18 LAB — GLUCOSE, CAPILLARY: GLUCOSE-CAPILLARY: 114 mg/dL — AB (ref 65–99)

## 2016-04-18 LAB — POCT GLYCOSYLATED HEMOGLOBIN (HGB A1C): HEMOGLOBIN A1C: 6.5

## 2016-04-18 MED ORDER — BACLOFEN 10 MG PO TABS: 5.0000 mg | ORAL_TABLET | Freq: Every day | ORAL | 0 refills | Status: DC | PRN

## 2016-04-18 NOTE — Assessment & Plan Note (Signed)
Patient requests a change from Flexeril to a different muscle relaxer for her right trapezius muscle spasm. She noted some improvement with flexeril, but it seemed to make her nauseous.   Plan: -Baclofen 5 mg QD prn -Warm compresses -Stretches

## 2016-04-18 NOTE — Assessment & Plan Note (Signed)
BP Readings from Last 3 Encounters:  04/18/16 129/68  04/06/16 136/73  05/10/15 (!) 147/73    Lab Results  Component Value Date   NA 136 09/22/2014   K 4.7 09/22/2014   CREATININE 1.53 (H) 09/22/2014    Assessment: Blood pressure control:  Controlled Progress toward BP goal:   At goal Comments: On Lasix 40 mg daily.   Plan: Medications:  continue current medications Other plans: Follow up in 3 months.

## 2016-04-18 NOTE — Assessment & Plan Note (Signed)
Patient follows with Dermatology at [facility]. She states her pain has resolved but she continues to have the hyperpigmentation without ulceration. She is on Trental 400 mg BID and uses compression hose. She was also prescribed urea cream and triamcinolone cream by her Dermatologist.

## 2016-04-18 NOTE — Assessment & Plan Note (Signed)
Lab Results  Component Value Date   HGBA1C 6.5 04/18/2016   HGBA1C 7.0 05/10/2015   HGBA1C 6.7 02/23/2015     Assessment: Diabetes control:   controlled Progress toward A1C goal:    at goal Comments: compliant with Lantus 35 units QHS and Novolog 6 units before the largest meal of the day. She has not been checking her glucose. She denies hypoglycemic symptoms.   Plan: Medications:  continue current medications Home glucose monitoring: Frequency:  QID Timing:  ACHS Instruction/counseling given: reminded to bring blood glucose meter & log to each visit, discussed foot care and discussed diet Other plans: Follow up in 3 months.

## 2016-04-18 NOTE — Assessment & Plan Note (Signed)
Patient follows with vascular surgery and recently had a CT abdomen in June 2017 showing stability of her AAA. Plans are to repeat imaging in June 2018.

## 2016-04-18 NOTE — Assessment & Plan Note (Signed)
Follows with [facility] with Prograf 5 mg BID, Prednisone 5 mg QD, Cellcept 500 mg BID and Bactrim QD for ppx.

## 2016-04-18 NOTE — Progress Notes (Signed)
    CC: follow up for HTN  HPI: [name] is a 70 y.o. female with PMHx of Renal Transplant, T2DM, CKD, HTN, Hypothyroidism who presents to the clinic for follow up for HTN and T2DM. Please see problem based assessment and plan for more information.  Patient is eating and drinking well. She denies chest pain or shortness of breath.   Past Medical History:  Diagnosis Date  . Allergy   . Blood transfusion without reported diagnosis   . Cataract   . Clotting disorder ([place])   . Diabetes mellitus   . Hypertension   . Morbid obesity (HCC)    BMI 34  . Renal disorder   . Thyroid disease   . Ulcer ([place])     Review of Systems: Please see pertinent ROS reviewed in HPI and problem based charting.   Physical Exam: Vitals:   04/18/16 1352  BP: 129/68  Pulse: 86  Temp: 97.7 F (36.5 C)  TempSrc: Oral  SpO2: 99%  Weight: 224 lb 4.8 oz (101.7 kg)  Height: 5\' 8"  (1.727 m)   General: Vital signs reviewed.  Patient is well-developed and well-nourished, in no acute distress and cooperative with exam.  Cardiovascular: RRR Pulmonary/Chest: Clear to auscultation bilaterally, no wheezes, rales, or rhonchi. Abdominal: Soft, non-tender, non-distended, BS + Extremities: Trace lower extremity edema bilaterally, pulses symmetric and intact bilaterally.  Skin: Hyperpigmentation extending from bilateral ankles to calves.   Assessment & Plan:  See encounters tab for problem based medical decision making. Patient discussed with Dr. Beryle Beams.

## 2016-04-18 NOTE — Assessment & Plan Note (Signed)
Colon Cancer Screening: Received Stool Cards Breast Cancer Screening: mammogram normal march 2016, patient wants to repeat in Spring 2018 Podiatry referral placed

## 2016-04-18 NOTE — Patient Instructions (Signed)
Please continue taking all medications as prescribed.  For your diabetes: -Continue your current insulin regimen, but please remember to check your blood sugar daily  For your thyroid: -We will recheck your thyroid today and refill your medication accordingly  Please complete your stool cards  You can take Baclofen 1/2 tablet (5 mg) for muscle spasms.

## 2016-04-18 NOTE — Assessment & Plan Note (Addendum)
Patient has a history of hypothyroidism, well controlled on Synthroid 125 mcg daily. Last TSH was one year ago.   Plan: -Repeat TSH -Refill Synthroid accordingly

## 2016-04-19 LAB — TSH: TSH: 0.657 u[IU]/mL (ref 0.450–4.500)

## 2016-04-19 MED ORDER — LEVOTHYROXINE SODIUM 125 MCG PO TABS: 125.0000 ug | ORAL_TABLET | Freq: Every day | ORAL | 11 refills | Status: DC

## 2016-04-19 NOTE — Progress Notes (Signed)
Medicine attending: Medical history, presenting problems, physical findings, and medications, reviewed with resident physician Dr Alexa Burns on the day of the patient visit and I concur with her evaluation and management plan. 

## 2016-04-19 NOTE — Addendum Note (Signed)
Addended by: Martyn Malay R on: 04/19/2016 07:36 AM   Modules accepted: Orders

## 2016-04-27 DIAGNOSIS — N186 End stage renal disease: Secondary | ICD-10-CM | POA: Diagnosis not present

## 2016-04-27 DIAGNOSIS — Z992 Dependence on renal dialysis: Secondary | ICD-10-CM | POA: Diagnosis not present

## 2016-04-27 DIAGNOSIS — Z940 Kidney transplant status: Secondary | ICD-10-CM | POA: Diagnosis not present

## 2016-04-27 DIAGNOSIS — N281 Cyst of kidney, acquired: Secondary | ICD-10-CM | POA: Diagnosis not present

## 2016-04-27 DIAGNOSIS — D631 Anemia in chronic kidney disease: Secondary | ICD-10-CM | POA: Diagnosis not present

## 2016-04-27 DIAGNOSIS — E876 Hypokalemia: Secondary | ICD-10-CM | POA: Diagnosis not present

## 2016-04-27 DIAGNOSIS — N2581 Secondary hyperparathyroidism of renal origin: Secondary | ICD-10-CM | POA: Diagnosis not present

## 2016-04-27 DIAGNOSIS — I120 Hypertensive chronic kidney disease with stage 5 chronic kidney disease or end stage renal disease: Secondary | ICD-10-CM | POA: Diagnosis not present

## 2016-04-27 DIAGNOSIS — I129 Hypertensive chronic kidney disease with stage 1 through stage 4 chronic kidney disease, or unspecified chronic kidney disease: Secondary | ICD-10-CM | POA: Diagnosis not present

## 2016-04-27 DIAGNOSIS — D899 Disorder involving the immune mechanism, unspecified: Secondary | ICD-10-CM | POA: Diagnosis not present

## 2016-04-27 DIAGNOSIS — Z4822 Encounter for aftercare following kidney transplant: Secondary | ICD-10-CM | POA: Diagnosis not present

## 2016-04-27 DIAGNOSIS — Z9889 Other specified postprocedural states: Secondary | ICD-10-CM | POA: Diagnosis not present

## 2016-04-27 DIAGNOSIS — K219 Gastro-esophageal reflux disease without esophagitis: Secondary | ICD-10-CM | POA: Diagnosis not present

## 2016-04-27 DIAGNOSIS — E039 Hypothyroidism, unspecified: Secondary | ICD-10-CM | POA: Diagnosis not present

## 2016-04-27 DIAGNOSIS — Z79899 Other long term (current) drug therapy: Secondary | ICD-10-CM | POA: Diagnosis not present

## 2016-04-27 DIAGNOSIS — E1122 Type 2 diabetes mellitus with diabetic chronic kidney disease: Secondary | ICD-10-CM | POA: Diagnosis not present

## 2016-04-27 DIAGNOSIS — N183 Chronic kidney disease, stage 3 (moderate): Secondary | ICD-10-CM | POA: Diagnosis not present

## 2016-04-30 DIAGNOSIS — E1169 Type 2 diabetes mellitus with other specified complication: Secondary | ICD-10-CM | POA: Diagnosis not present

## 2016-04-30 DIAGNOSIS — K219 Gastro-esophageal reflux disease without esophagitis: Secondary | ICD-10-CM | POA: Diagnosis not present

## 2016-04-30 DIAGNOSIS — D8989 Other specified disorders involving the immune mechanism, not elsewhere classified: Secondary | ICD-10-CM | POA: Diagnosis not present

## 2016-04-30 DIAGNOSIS — D649 Anemia, unspecified: Secondary | ICD-10-CM | POA: Diagnosis not present

## 2016-04-30 DIAGNOSIS — Z6834 Body mass index (BMI) 34.0-34.9, adult: Secondary | ICD-10-CM | POA: Diagnosis not present

## 2016-04-30 DIAGNOSIS — Z940 Kidney transplant status: Secondary | ICD-10-CM | POA: Diagnosis not present

## 2016-04-30 DIAGNOSIS — E1122 Type 2 diabetes mellitus with diabetic chronic kidney disease: Secondary | ICD-10-CM | POA: Diagnosis not present

## 2016-04-30 DIAGNOSIS — N186 End stage renal disease: Secondary | ICD-10-CM | POA: Diagnosis not present

## 2016-04-30 DIAGNOSIS — Z794 Long term (current) use of insulin: Secondary | ICD-10-CM | POA: Diagnosis not present

## 2016-04-30 DIAGNOSIS — E039 Hypothyroidism, unspecified: Secondary | ICD-10-CM | POA: Diagnosis not present

## 2016-04-30 DIAGNOSIS — Z79899 Other long term (current) drug therapy: Secondary | ICD-10-CM | POA: Diagnosis not present

## 2016-04-30 DIAGNOSIS — D899 Disorder involving the immune mechanism, unspecified: Secondary | ICD-10-CM | POA: Diagnosis not present

## 2016-04-30 DIAGNOSIS — Z7952 Long term (current) use of systemic steroids: Secondary | ICD-10-CM | POA: Diagnosis not present

## 2016-04-30 DIAGNOSIS — Z95828 Presence of other vascular implants and grafts: Secondary | ICD-10-CM | POA: Diagnosis not present

## 2016-04-30 DIAGNOSIS — Z4822 Encounter for aftercare following kidney transplant: Secondary | ICD-10-CM | POA: Diagnosis not present

## 2016-04-30 DIAGNOSIS — I714 Abdominal aortic aneurysm, without rupture: Secondary | ICD-10-CM | POA: Diagnosis not present

## 2016-04-30 DIAGNOSIS — I151 Hypertension secondary to other renal disorders: Secondary | ICD-10-CM | POA: Diagnosis not present

## 2016-04-30 DIAGNOSIS — Z862 Personal history of diseases of the blood and blood-forming organs and certain disorders involving the immune mechanism: Secondary | ICD-10-CM | POA: Diagnosis not present

## 2016-04-30 DIAGNOSIS — I120 Hypertensive chronic kidney disease with stage 5 chronic kidney disease or end stage renal disease: Secondary | ICD-10-CM | POA: Diagnosis not present

## 2016-04-30 DIAGNOSIS — Z86718 Personal history of other venous thrombosis and embolism: Secondary | ICD-10-CM | POA: Diagnosis not present

## 2016-04-30 DIAGNOSIS — E871 Hypo-osmolality and hyponatremia: Secondary | ICD-10-CM | POA: Diagnosis not present

## 2016-04-30 DIAGNOSIS — Z7982 Long term (current) use of aspirin: Secondary | ICD-10-CM | POA: Diagnosis not present

## 2016-04-30 DIAGNOSIS — E669 Obesity, unspecified: Secondary | ICD-10-CM | POA: Diagnosis not present

## 2016-05-02 ENCOUNTER — Ambulatory Visit: Payer: Medicare Other | Admitting: Podiatry

## 2016-05-10 ENCOUNTER — Ambulatory Visit: Payer: Medicare Other | Admitting: Podiatry

## 2016-05-11 ENCOUNTER — Ambulatory Visit (INDEPENDENT_AMBULATORY_CARE_PROVIDER_SITE_OTHER): Payer: Medicare Other | Admitting: Podiatry

## 2016-05-11 ENCOUNTER — Encounter: Payer: Self-pay | Admitting: Podiatry

## 2016-05-11 ENCOUNTER — Other Ambulatory Visit: Payer: Self-pay | Admitting: *Deleted

## 2016-05-11 VITALS — BP 133/75 | HR 84 | Resp 16

## 2016-05-11 DIAGNOSIS — E1140 Type 2 diabetes mellitus with diabetic neuropathy, unspecified: Secondary | ICD-10-CM | POA: Diagnosis not present

## 2016-05-11 DIAGNOSIS — M79676 Pain in unspecified toe(s): Secondary | ICD-10-CM

## 2016-05-11 DIAGNOSIS — Q828 Other specified congenital malformations of skin: Secondary | ICD-10-CM

## 2016-05-11 DIAGNOSIS — E1149 Type 2 diabetes mellitus with other diabetic neurological complication: Secondary | ICD-10-CM | POA: Diagnosis not present

## 2016-05-11 DIAGNOSIS — L84 Corns and callosities: Secondary | ICD-10-CM | POA: Diagnosis not present

## 2016-05-11 DIAGNOSIS — B351 Tinea unguium: Secondary | ICD-10-CM

## 2016-05-11 DIAGNOSIS — M21619 Bunion of unspecified foot: Secondary | ICD-10-CM | POA: Diagnosis not present

## 2016-05-11 NOTE — Progress Notes (Signed)
Subjective:     Patient ID: [name], female   DOB: 1946-02-19, 70 y.o.   MRN: [id number]  HPI patient presents stating that she has had long-term diabetes with diminishment of neurological sensation and has nail disease 1-5 both feet and lesion subsecond metatarsal right that's painful   Review of Systems  All other systems reviewed and are negative.      Objective:   Physical Exam  Constitutional: She is oriented to person, place, and time.  Cardiovascular: Intact distal pulses.   Musculoskeletal: Normal range of motion.  Neurological: She is alert and oriented to person, place, and time.  Skin: Skin is warm and dry.  Nursing note and vitals reviewed.  neurovascular status is found to be diminished with diminished pulses diminished sharp [place] vibratory with patient found have nail disease 1-5 both feet with thick incurvated nailbeds and keratotic lesion subsecond metatarsal that's mildly to moderately painful when pressed. 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 She'll be seen back to recheck when we get permission for diabetic shoes

## 2016-05-11 NOTE — Progress Notes (Signed)
   Subjective:    Patient ID: [name], female    DOB: Nov 17, 1945, 70 y.o.   MRN: [id number]  HPI Chief Complaint  Patient presents with  . Diabetes    Foot exam and nail care - Patient states she is unable to care for her nails and also has a callus plantar forefoot right she would like trimmed  . NOTE    Last A1c was "6.something"      Review of Systems  Cardiovascular: Positive for leg swelling.  Musculoskeletal: Positive for gait problem and myalgias.  All other systems reviewed and are negative.      Objective:   Physical Exam        Assessment & Plan:

## 2016-05-24 ENCOUNTER — Other Ambulatory Visit: Payer: Self-pay | Admitting: Internal Medicine

## 2016-05-24 DIAGNOSIS — K219 Gastro-esophageal reflux disease without esophagitis: Secondary | ICD-10-CM

## 2016-05-31 ENCOUNTER — Telehealth: Payer: Self-pay | Admitting: *Deleted

## 2016-05-31 NOTE — Telephone Encounter (Signed)
Pt. Was calling to see if we received the paperwork from her primary about her diabetic shoes.

## 2016-06-05 DIAGNOSIS — I8310 Varicose veins of unspecified lower extremity with inflammation: Secondary | ICD-10-CM | POA: Diagnosis not present

## 2016-06-05 DIAGNOSIS — M793 Panniculitis, unspecified: Secondary | ICD-10-CM | POA: Diagnosis not present

## 2016-06-05 DIAGNOSIS — L280 Lichen simplex chronicus: Secondary | ICD-10-CM | POA: Diagnosis not present

## 2016-06-05 DIAGNOSIS — T148XXA Other injury of unspecified body region, initial encounter: Secondary | ICD-10-CM | POA: Diagnosis not present

## 2016-06-05 DIAGNOSIS — Z940 Kidney transplant status: Secondary | ICD-10-CM | POA: Diagnosis not present

## 2016-06-21 ENCOUNTER — Ambulatory Visit: Payer: Medicare Other | Admitting: *Deleted

## 2016-06-21 DIAGNOSIS — E1149 Type 2 diabetes mellitus with other diabetic neurological complication: Principal | ICD-10-CM

## 2016-06-21 DIAGNOSIS — E1140 Type 2 diabetes mellitus with diabetic neuropathy, unspecified: Secondary | ICD-10-CM

## 2016-06-21 NOTE — Progress Notes (Signed)
Patient ID: [name], female   DOB: [birth year], 70 y.o.   MRN: [id number]  Patient presents to be scanned and measured for diabetic shoes and inserts.

## 2016-06-28 ENCOUNTER — Other Ambulatory Visit: Payer: Medicare Other

## 2016-07-27 ENCOUNTER — Other Ambulatory Visit: Payer: Self-pay | Admitting: Internal Medicine

## 2016-07-27 MED ORDER — ASPIRIN 81 MG PO TBEC: DELAYED_RELEASE_TABLET | ORAL | 3 refills | Status: DC

## 2016-07-27 NOTE — Telephone Encounter (Signed)
furosemide (LASIX) 40 MG tablet, RA ASPIRIN EC 81 MG EC tablet, Refill request @ [facility] in [place] .

## 2016-08-01 ENCOUNTER — Encounter: Payer: Medicare Other | Admitting: Internal Medicine

## 2016-08-10 ENCOUNTER — Ambulatory Visit: Payer: Medicare Other

## 2016-08-19 DIAGNOSIS — M25572 Pain in left ankle and joints of left foot: Secondary | ICD-10-CM | POA: Diagnosis not present

## 2016-09-17 ENCOUNTER — Other Ambulatory Visit: Payer: Self-pay

## 2016-09-17 NOTE — Telephone Encounter (Signed)
Insulin Pen Needle 32G X 4 MM MISC, Refill request @ [facility] on [address].

## 2016-09-18 MED ORDER — INSULIN PEN NEEDLE 32G X 4 MM MISC: 3 refills | Status: DC

## 2016-09-24 ENCOUNTER — Ambulatory Visit: Payer: Medicare Other

## 2016-10-04 ENCOUNTER — Other Ambulatory Visit: Payer: Self-pay | Admitting: Internal Medicine

## 2016-10-04 DIAGNOSIS — E1143 Type 2 diabetes mellitus with diabetic autonomic (poly)neuropathy: Secondary | ICD-10-CM

## 2016-10-05 NOTE — Telephone Encounter (Signed)
Pt called / informed of refill. 

## 2016-10-05 NOTE — Telephone Encounter (Signed)
Pt had called / stated she has enough only for today. Thanks

## 2016-10-12 DIAGNOSIS — H53419 Scotoma involving central area, unspecified eye: Secondary | ICD-10-CM | POA: Diagnosis not present

## 2016-10-12 DIAGNOSIS — H353290 Exudative age-related macular degeneration, unspecified eye, stage unspecified: Secondary | ICD-10-CM | POA: Diagnosis not present

## 2016-10-12 DIAGNOSIS — H538 Other visual disturbances: Secondary | ICD-10-CM | POA: Diagnosis not present

## 2016-10-24 ENCOUNTER — Encounter: Payer: Medicare Other | Admitting: Internal Medicine

## 2016-10-30 ENCOUNTER — Ambulatory Visit: Payer: Medicare Other

## 2016-11-02 DIAGNOSIS — Z7952 Long term (current) use of systemic steroids: Secondary | ICD-10-CM | POA: Diagnosis not present

## 2016-11-02 DIAGNOSIS — E039 Hypothyroidism, unspecified: Secondary | ICD-10-CM | POA: Diagnosis not present

## 2016-11-02 DIAGNOSIS — Z8679 Personal history of other diseases of the circulatory system: Secondary | ICD-10-CM | POA: Diagnosis not present

## 2016-11-02 DIAGNOSIS — N186 End stage renal disease: Secondary | ICD-10-CM | POA: Diagnosis not present

## 2016-11-02 DIAGNOSIS — E876 Hypokalemia: Secondary | ICD-10-CM | POA: Diagnosis not present

## 2016-11-02 DIAGNOSIS — E089 Diabetes mellitus due to underlying condition without complications: Secondary | ICD-10-CM | POA: Diagnosis not present

## 2016-11-02 DIAGNOSIS — Z4822 Encounter for aftercare following kidney transplant: Secondary | ICD-10-CM | POA: Diagnosis not present

## 2016-11-02 DIAGNOSIS — Z862 Personal history of diseases of the blood and blood-forming organs and certain disorders involving the immune mechanism: Secondary | ICD-10-CM | POA: Diagnosis not present

## 2016-11-02 DIAGNOSIS — D631 Anemia in chronic kidney disease: Secondary | ICD-10-CM | POA: Diagnosis not present

## 2016-11-02 DIAGNOSIS — N2581 Secondary hyperparathyroidism of renal origin: Secondary | ICD-10-CM | POA: Diagnosis not present

## 2016-11-02 DIAGNOSIS — Z86718 Personal history of other venous thrombosis and embolism: Secondary | ICD-10-CM | POA: Diagnosis not present

## 2016-11-02 DIAGNOSIS — I129 Hypertensive chronic kidney disease with stage 1 through stage 4 chronic kidney disease, or unspecified chronic kidney disease: Secondary | ICD-10-CM | POA: Diagnosis not present

## 2016-11-02 DIAGNOSIS — N183 Chronic kidney disease, stage 3 (moderate): Secondary | ICD-10-CM | POA: Diagnosis not present

## 2016-11-02 DIAGNOSIS — K219 Gastro-esophageal reflux disease without esophagitis: Secondary | ICD-10-CM | POA: Diagnosis not present

## 2016-11-02 DIAGNOSIS — Z79899 Other long term (current) drug therapy: Secondary | ICD-10-CM | POA: Diagnosis not present

## 2016-11-02 DIAGNOSIS — Z940 Kidney transplant status: Secondary | ICD-10-CM | POA: Diagnosis not present

## 2016-11-02 DIAGNOSIS — Z8719 Personal history of other diseases of the digestive system: Secondary | ICD-10-CM | POA: Diagnosis not present

## 2016-11-02 DIAGNOSIS — I120 Hypertensive chronic kidney disease with stage 5 chronic kidney disease or end stage renal disease: Secondary | ICD-10-CM | POA: Diagnosis not present

## 2016-11-23 ENCOUNTER — Ambulatory Visit (INDEPENDENT_AMBULATORY_CARE_PROVIDER_SITE_OTHER): Payer: Medicare Other | Admitting: Internal Medicine

## 2016-11-23 DIAGNOSIS — B0229 Other postherpetic nervous system involvement: Secondary | ICD-10-CM

## 2016-11-23 DIAGNOSIS — B0089 Other herpesviral infection: Secondary | ICD-10-CM | POA: Insufficient documentation

## 2016-11-23 DIAGNOSIS — Z87891 Personal history of nicotine dependence: Secondary | ICD-10-CM

## 2016-11-23 DIAGNOSIS — B029 Zoster without complications: Secondary | ICD-10-CM

## 2016-11-23 DIAGNOSIS — Z940 Kidney transplant status: Secondary | ICD-10-CM | POA: Diagnosis not present

## 2016-11-23 DIAGNOSIS — B009 Herpesviral infection, unspecified: Secondary | ICD-10-CM | POA: Insufficient documentation

## 2016-11-23 MED ORDER — VALACYCLOVIR HCL 1 G PO TABS: 2000.0000 mg | ORAL_TABLET | Freq: Three times a day (TID) | ORAL | 0 refills | Status: AC

## 2016-11-23 NOTE — Assessment & Plan Note (Addendum)
Cutaneous ulceration with history of papules progressing to vesicles and associated pruritus and burning pain consistent with herpes zoster localized sacral dermatome. She is immunosuppressed, but there is no evidence of disseminated zoster, or mucocutaneous or ocular involvement.  -Valacyclovir 1 g 3 times a day for 1 week -Return to clinic in 1 week or if new vesicles appear

## 2016-11-23 NOTE — Progress Notes (Deleted)
10 d worsening rash.  Pruritic, lower back.  Cluster of little bumps.  Has been scratching.  Then fluid filled.  Used [employer] (camphor).  Burning pain after blisters unroofed.  4 months ago last flare of rash.  Goes away completely in between, but has exacerbating every few months.  Has previous had issue with rash on sacrum/buttocks.  Saw dermatologist, treat with "fungus cream" many months ago.  No new meds.  Cr. 1.44, eGFR 54.  Does not recall ever having had chickenpox.

## 2016-11-23 NOTE — Progress Notes (Signed)
   CC: "I have this itchy and painful rash on my buttocks."  HPI:  [name] is a 71 y.o. woman with history of HTN, obesity, and renal transplant (on mycophenolate and tacrolimus) who presents with a rash.  For the past 10 days or so she has noticed a pruritic rash on her lower back/upper buttocks. It started as a cluster of little bumps on the left side of her buttocks and then spread to the right side. She thinks there were little fluid filled blisters, but she has been scratching them.  Especially after scratching them, she has some burning pain in the area. She has been using over-the-counter Blue Star Ointment (camphor).   After since her kidney transplant in 2010 she has had intermittent rashes, usually in the sacral, genital, and buttock area. In between episodes the rash is healed completely, last time was about 4 months ago.  She has seen a dermatologist in the past think she was prescribed an antifungal cream at that time.  She has not had any new medicines recently. Does not recall ever having had chickenpox, but was told that she was not a candidate for shingles vaccine due to her immunosuppression.    Past Medical History:  Diagnosis Date  . Allergy   . Blood transfusion without reported diagnosis   . Cataract   . Clotting disorder ([place])   . Diabetes mellitus   . Hypertension   . Morbid obesity (H[facility])    BMI 34  . Renal disorder   . Thyroid disease   . Ulcer     Review of Systems:   Review of Systems  Constitutional: Negative for chills and fever.  Eyes: Negative for pain and redness.  Skin: Positive for itching and rash.     Physical Exam:  Vitals:   11/23/16 1018  BP: (!) 142/85  Pulse: 77  Temp: 98.4 F (36.9 C)  TempSrc: Oral  SpO2: 99%  Weight: 232 lb 6.4 oz (105.4 kg)  Height: 5' 7.5" (1.715 m)   Physical Exam  Constitutional: She is oriented to person, place, and time. She appears well-developed and well-nourished. No distress.    HENT:  Mouth/Throat: Oropharynx is clear and moist.  No mucosal lesions  Eyes: Conjunctivae are normal. Right eye exhibits no discharge. Left eye exhibits no discharge.  Cardiovascular: Normal rate and regular rhythm.   Pulmonary/Chest: Effort normal and breath sounds normal.  Neurological: She is alert and oriented to person, place, and time.  Skin:  Linear cluster of ulcers on right buttock just lateral to the intergluteal cleft with signs of excoriation. Mild hyperpigmentation on the left side of the gluteal cleft suggestive of healing rash. No vesicles present currently.  Psychiatric: She has a normal mood and affect. Her behavior is normal.       From Care Everywhere Cr. 1.44 in 10/2016   Assessment & Plan:   See Encounters Tab for problem based charting.  Patient discussed with Dr. Lynnae January

## 2016-11-23 NOTE — Patient Instructions (Addendum)
I think your rash is shingles, which people on immunosuppressant medicines like you are on for your kidney transplant, are particularly susceptible to.  I have prescribed you Valtrex to take 3 times a day for the next week to try and help this rash heal. Please come back to get it checked on next week.  If you notice any new fluid filled blisters, please call and come in for an appointment the same day because it would be very useful to be able to sample the fluid to confirm the diagnosis.  If you get a rash spreading over large parts of your body, changes in vision or eye pain, or fevers, please call the clinic right away or go to the emergency department.   Shingles Shingles is an infection that causes a painful skin rash and fluid-filled blisters. Shingles is caused by the same virus that causes chickenpox. Shingles only develops in people who:  Have had chickenpox.  Have gotten the chickenpox vaccine. (This is rare.)  The first symptoms of shingles may be itching, tingling, or pain in an area on your skin. A rash will follow in a few days or weeks. The rash is usually on one side of the body in a bandlike or beltlike pattern. Over time, the rash turns into fluid-filled blisters that break open, scab over, and dry up. Medicines may:  Help you manage pain.  Help you recover more quickly.  Help to prevent long-term problems.  Follow these instructions at home: Medicines  Take medicines only as told by your doctor.  Apply an anti-itch or numbing cream to the affected area as told by your doctor. Blister and Rash Care  Take a cool bath or put cool compresses on the area of the rash or blisters as told by your doctor. This may help with pain and itching.  Keep your rash covered with a loose bandage (dressing). Wear loose-fitting clothing.  Keep your rash and blisters clean with mild soap and cool water or as told by your doctor.  Check your rash every day for signs of infection.  These include redness, swelling, and pain that lasts or gets worse.  Do not pick your blisters.  Do not scratch your rash. General instructions  Rest as told by your doctor.  Keep all follow-up visits as told by your doctor. This is important.  Until your blisters scab over, your infection can cause chickenpox in people who have never had it or been vaccinated against it. To prevent this from happening, avoid touching other people or being around other people, especially: ? Babies. ? Pregnant women. ? Children who have eczema. ? Elderly people who have transplants. ? People who have chronic illnesses, such as leukemia or AIDS. Contact a doctor if:  Your pain does not get better with medicine.  Your pain does not get better after the rash heals.  Your rash looks infected. Signs of infection include: ? Redness. ? Swelling. ? Pain that lasts or gets worse. Get help right away if:  The rash is on your face or nose.  You have pain in your face, pain around your eye area, or loss of feeling on one side of your face.  You have ear pain or you have ringing in your ear.  You have loss of taste.  Your condition gets worse. This information is not intended to replace advice given to you by your health care provider. Make sure you discuss any questions you have with your health care provider.  Document Released: 11/28/2007 Document Revised: 02/05/2016 Document Reviewed: 03/23/2014 Elsevier Interactive Patient Education  [employer].

## 2016-11-23 NOTE — Progress Notes (Signed)
Internal Medicine Clinic Attending  Case discussed with Dr. O'Sullivan at the time of the visit.  We reviewed the resident's history and exam and pertinent patient test results.  I agree with the assessment, diagnosis, and plan of care documented in the resident's note. 

## 2016-11-26 ENCOUNTER — Ambulatory Visit: Payer: Medicare Other

## 2016-11-28 ENCOUNTER — Encounter: Payer: Self-pay | Admitting: *Deleted

## 2016-12-04 DIAGNOSIS — Z940 Kidney transplant status: Secondary | ICD-10-CM | POA: Diagnosis not present

## 2016-12-04 DIAGNOSIS — I8310 Varicose veins of unspecified lower extremity with inflammation: Secondary | ICD-10-CM | POA: Diagnosis not present

## 2016-12-04 DIAGNOSIS — L299 Pruritus, unspecified: Secondary | ICD-10-CM | POA: Diagnosis not present

## 2016-12-07 ENCOUNTER — Ambulatory Visit (INDEPENDENT_AMBULATORY_CARE_PROVIDER_SITE_OTHER): Payer: Medicare Other | Admitting: Internal Medicine

## 2016-12-07 ENCOUNTER — Encounter: Payer: Self-pay | Admitting: Internal Medicine

## 2016-12-07 VITALS — BP 139/71 | HR 88 | Temp 98.2°F | Wt 229.5 lb

## 2016-12-07 DIAGNOSIS — Z87891 Personal history of nicotine dependence: Secondary | ICD-10-CM

## 2016-12-07 DIAGNOSIS — I10 Essential (primary) hypertension: Secondary | ICD-10-CM

## 2016-12-07 DIAGNOSIS — Z79899 Other long term (current) drug therapy: Secondary | ICD-10-CM

## 2016-12-07 DIAGNOSIS — Z6835 Body mass index (BMI) 35.0-35.9, adult: Secondary | ICD-10-CM

## 2016-12-07 DIAGNOSIS — E669 Obesity, unspecified: Secondary | ICD-10-CM

## 2016-12-07 DIAGNOSIS — Z940 Kidney transplant status: Secondary | ICD-10-CM | POA: Diagnosis not present

## 2016-12-07 DIAGNOSIS — B009 Herpesviral infection, unspecified: Secondary | ICD-10-CM

## 2016-12-07 DIAGNOSIS — B0089 Other herpesviral infection: Secondary | ICD-10-CM

## 2016-12-07 NOTE — Assessment & Plan Note (Addendum)
[name] was seen last week for an ulcerating rash on her buttocks that time I thought it was shingles due to linear pattern of ulcers and prescribed Valtrex 1 g 3 times a day for 1 week which she completed.  In the interval, she saw her dermatologist for regularly scheduled follow-up for lipodermatosclerosis.  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  Prescribed Valtrex 2 g daily for 5 days when necessary for recurrent rash.  A/P Recurrent HSV.  Today her rash is resolving and she no longer has symptoms of pain or pruritus.  -Valtrex 2 g daily for 5 days when necessary -Return to clinic if rash is different, worse, more diffuse, or persistent, or if associated with any systemic symptoms

## 2016-12-07 NOTE — Progress Notes (Signed)
   CC: "I think my rash is getting better but I can't see it back there."  HPI:  [name] is a 71 y.o. woman with history of HTN, obesity, and renal transplant (on mycophenolate and tacrolimus) who presents for follow-up of a rash.   Past Medical History:  Diagnosis Date  . Allergy   . Blood transfusion without reported diagnosis   . Cataract   . Clotting disorder ([place])   . Diabetes mellitus   . Hypertension   . Morbid obesity (H[facility])    BMI 34  . Renal disorder   . Thyroid disease   . Ulcer     Review of Systems:   Review of Systems  Constitutional: Negative for chills and fever.  Eyes: Negative for pain and redness.  Skin: Positive for rash.       Pain and itching improved   Physical Exam:  Vitals:   12/07/16 0920  BP: 139/71  Pulse: 88  Temp: 98.2 F (36.8 C)  TempSrc: Oral  SpO2: 99%  Weight: 229 lb 8 oz (104.1 kg)   Physical Exam  Constitutional: She is oriented to person, place, and time. She appears well-developed and well-nourished. No distress.  Neurological: She is alert and oriented to person, place, and time.  Skin:  Rash on buttocks healing well, with hyperpigmentation and scaling but no vesicles or ulceration  Psychiatric: She has a normal mood and affect. Her behavior is normal.        Assessment & Plan:   See Encounters Tab for problem based charting.  Patient discussed with Dr. Lynnae January

## 2016-12-07 NOTE — Patient Instructions (Addendum)
I'm glad your rash is healing well.  If you notice it coming back, you can try taking the Valtrex as prescribed by your dermatologist.  We will try and schedule you an appointment with your new primary doctor as soon as possible to work on your diabetes and other problems.

## 2016-12-07 NOTE — Progress Notes (Signed)
Internal Medicine Clinic Attending  Case discussed with Dr. O'Sullivan at the time of the visit.  We reviewed the resident's history and exam and pertinent patient test results.  I agree with the assessment, diagnosis, and plan of care documented in the resident's note. 

## 2016-12-19 ENCOUNTER — Encounter: Payer: Self-pay | Admitting: Podiatry

## 2016-12-19 ENCOUNTER — Ambulatory Visit (INDEPENDENT_AMBULATORY_CARE_PROVIDER_SITE_OTHER): Payer: Medicare Other | Admitting: Podiatry

## 2016-12-19 DIAGNOSIS — B351 Tinea unguium: Secondary | ICD-10-CM | POA: Diagnosis not present

## 2016-12-19 DIAGNOSIS — Q828 Other specified congenital malformations of skin: Secondary | ICD-10-CM

## 2016-12-19 DIAGNOSIS — M79676 Pain in unspecified toe(s): Secondary | ICD-10-CM | POA: Diagnosis not present

## 2016-12-19 NOTE — Progress Notes (Signed)
This patient presents the office with chief complaint of a painful callus through the right forefoot area. She states that this callus is painful walking and wearing her shoes. She also says that she has long thick painful nails. These nails are painful walking and wearing her shoes. She has a history of being diabetic, secondary to a kidney transplant which occurred in 2010.  She presents the office today for preventative foot care services  GENERAL APPEARANCE: Alert, conversant. Appropriately groomed. No acute distress.  VASCULAR: Pedal pulses are  palpable at  [facility] and PT bilateral.  Capillary refill time is immediate to all digits,  Normal temperature gradient.  Digital hair growth is present bilateral  NEUROLOGIC: sensation is normal to 5.07 monofilament at 5/5 sites bilateral.  Light touch is intact bilateral, Muscle strength normal.  MUSCULOSKELETAL: acceptable muscle strength, tone and stability bilateral.  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 Hammer toe  B/L  DERMATOLOGIC: skin color, texture, and turgor are within normal limits.  No preulcerative lesions or ulcers  are seen, no interdigital maceration noted.  No open lesions present.  Digital nails are asymptomatic. No drainage noted.  Onychomycosis  Porokeratosis  Sub 2 right.  Debridement and grinding of long painful nails.  Debridement of porokeratosis sub-2 right foot.  Discussed diabetic shoes to be obtained in the future   Gardiner Barefoot DPM

## 2016-12-25 ENCOUNTER — Other Ambulatory Visit: Payer: Self-pay

## 2016-12-25 DIAGNOSIS — E1143 Type 2 diabetes mellitus with diabetic autonomic (poly)neuropathy: Secondary | ICD-10-CM

## 2016-12-25 MED ORDER — INSULIN GLARGINE 100 UNIT/ML SOLOSTAR PEN: PEN_INJECTOR | SUBCUTANEOUS | 5 refills | Status: DC

## 2016-12-25 NOTE — Telephone Encounter (Signed)
LANTUS SOLOSTAR 100 UNIT/ML Solostar Pen, refill request @ [facility] on [address]

## 2016-12-25 NOTE — Telephone Encounter (Signed)
Pt was told by [facility], the doctor who wote last rx is not Medicaid approved; need new rx.

## 2016-12-28 DIAGNOSIS — I714 Abdominal aortic aneurysm, without rupture: Secondary | ICD-10-CM | POA: Diagnosis not present

## 2017-01-01 ENCOUNTER — Telehealth: Payer: Self-pay | Admitting: Internal Medicine

## 2017-01-01 NOTE — Telephone Encounter (Signed)
I do not think we can electronically prescribe this. She may have to pick up a printed prescription at her next visit

## 2017-01-01 NOTE — Telephone Encounter (Signed)
Patient requesting a shingles shot from the [employer] [facility].

## 2017-01-07 NOTE — Telephone Encounter (Signed)
Pt needs to speak with a nurse about shingles shot. Please call pt back.

## 2017-01-08 NOTE — Telephone Encounter (Signed)
Returned patient's call she has questions about getting shingles vaccine since she is a transplant patient. Advised patient to write her questions and concerns and speak with PCP during her visit on 01/11/2017 PCP will be able to give her answers.

## 2017-01-11 ENCOUNTER — Ambulatory Visit: Payer: Medicare Other

## 2017-01-11 ENCOUNTER — Encounter: Payer: Self-pay | Admitting: Internal Medicine

## 2017-02-05 DIAGNOSIS — E039 Hypothyroidism, unspecified: Secondary | ICD-10-CM | POA: Diagnosis not present

## 2017-02-05 DIAGNOSIS — B009 Herpesviral infection, unspecified: Secondary | ICD-10-CM | POA: Diagnosis not present

## 2017-02-05 DIAGNOSIS — Z792 Long term (current) use of antibiotics: Secondary | ICD-10-CM | POA: Diagnosis not present

## 2017-02-05 DIAGNOSIS — N2581 Secondary hyperparathyroidism of renal origin: Secondary | ICD-10-CM | POA: Diagnosis not present

## 2017-02-05 DIAGNOSIS — E876 Hypokalemia: Secondary | ICD-10-CM | POA: Diagnosis not present

## 2017-02-05 DIAGNOSIS — Z940 Kidney transplant status: Secondary | ICD-10-CM | POA: Diagnosis not present

## 2017-02-05 DIAGNOSIS — I129 Hypertensive chronic kidney disease with stage 1 through stage 4 chronic kidney disease, or unspecified chronic kidney disease: Secondary | ICD-10-CM | POA: Diagnosis not present

## 2017-02-05 DIAGNOSIS — Z7952 Long term (current) use of systemic steroids: Secondary | ICD-10-CM | POA: Diagnosis not present

## 2017-02-05 DIAGNOSIS — I10 Essential (primary) hypertension: Secondary | ICD-10-CM | POA: Diagnosis not present

## 2017-02-05 DIAGNOSIS — Z8679 Personal history of other diseases of the circulatory system: Secondary | ICD-10-CM | POA: Diagnosis not present

## 2017-02-05 DIAGNOSIS — K219 Gastro-esophageal reflux disease without esophagitis: Secondary | ICD-10-CM | POA: Diagnosis not present

## 2017-02-05 DIAGNOSIS — Z9889 Other specified postprocedural states: Secondary | ICD-10-CM | POA: Diagnosis not present

## 2017-02-05 DIAGNOSIS — Z79899 Other long term (current) drug therapy: Secondary | ICD-10-CM | POA: Diagnosis not present

## 2017-02-05 DIAGNOSIS — N281 Cyst of kidney, acquired: Secondary | ICD-10-CM | POA: Diagnosis not present

## 2017-02-05 DIAGNOSIS — N183 Chronic kidney disease, stage 3 (moderate): Secondary | ICD-10-CM | POA: Diagnosis not present

## 2017-02-23 DIAGNOSIS — Z940 Kidney transplant status: Secondary | ICD-10-CM | POA: Diagnosis not present

## 2017-02-23 DIAGNOSIS — Z79899 Other long term (current) drug therapy: Secondary | ICD-10-CM | POA: Diagnosis not present

## 2017-02-27 ENCOUNTER — Encounter: Payer: Medicare Other | Admitting: Internal Medicine

## 2017-03-06 ENCOUNTER — Encounter: Payer: Medicare Other | Admitting: Internal Medicine

## 2017-03-06 ENCOUNTER — Encounter: Payer: Self-pay | Admitting: Internal Medicine

## 2017-03-14 NOTE — Progress Notes (Signed)
Ms [name] is going to call Dr Lupita Leash office and discuss postponing surgery, patient has a bad cough.

## 2017-03-15 ENCOUNTER — Encounter (HOSPITAL_COMMUNITY): Admission: RE | Payer: Self-pay | Source: Ambulatory Visit

## 2017-03-15 ENCOUNTER — Ambulatory Visit (HOSPITAL_COMMUNITY): Admission: RE | Admit: 2017-03-15 | Payer: Medicare Other | Source: Ambulatory Visit | Admitting: Oral Surgery

## 2017-03-15 SURGERY — DENTAL RESTORATION/EXTRACTIONS
Anesthesia: General

## 2017-03-22 ENCOUNTER — Ambulatory Visit: Payer: Medicare Other

## 2017-03-26 ENCOUNTER — Ambulatory Visit: Payer: Medicare Other

## 2017-03-29 ENCOUNTER — Encounter: Payer: Self-pay | Admitting: Internal Medicine

## 2017-03-29 ENCOUNTER — Ambulatory Visit (INDEPENDENT_AMBULATORY_CARE_PROVIDER_SITE_OTHER): Payer: Medicare Other | Admitting: Internal Medicine

## 2017-03-29 VITALS — BP 130/73 | HR 88 | Temp 98.3°F | Ht 67.5 in | Wt 230.4 lb

## 2017-03-29 DIAGNOSIS — Z6835 Body mass index (BMI) 35.0-35.9, adult: Secondary | ICD-10-CM

## 2017-03-29 DIAGNOSIS — I10 Essential (primary) hypertension: Secondary | ICD-10-CM | POA: Diagnosis not present

## 2017-03-29 DIAGNOSIS — J069 Acute upper respiratory infection, unspecified: Secondary | ICD-10-CM | POA: Diagnosis not present

## 2017-03-29 DIAGNOSIS — E119 Type 2 diabetes mellitus without complications: Secondary | ICD-10-CM | POA: Diagnosis not present

## 2017-03-29 DIAGNOSIS — Z23 Encounter for immunization: Secondary | ICD-10-CM | POA: Diagnosis not present

## 2017-03-29 MED ORDER — FLUTICASONE PROPIONATE 50 MCG/ACT NA SUSP: 2.0000 | Freq: Every day | NASAL | 0 refills | Status: DC

## 2017-03-29 MED ORDER — BENZONATATE 100 MG PO CAPS: 100.0000 mg | ORAL_CAPSULE | Freq: Three times a day (TID) | ORAL | 0 refills | Status: DC | PRN

## 2017-03-29 MED ORDER — SALINE SPRAY 0.65 % NA SOLN: 1.0000 | NASAL | 0 refills | Status: DC | PRN

## 2017-03-29 NOTE — Assessment & Plan Note (Signed)
Patient likely has a viral URI which is resolving. She is afebrile, normotensive, and saturating 100% on room air. She has mild mucosal edema of her turbinates, otherwise lungs and oropharynx are clear. Will recommend conservative management. - Flonase 2 sprays each nostril daily - Tessalon prn - Nasal saline irrigation - Continue Tylenol and Benadryl prn

## 2017-03-29 NOTE — Assessment & Plan Note (Signed)
Flu shot given today

## 2017-03-29 NOTE — Patient Instructions (Addendum)
It was a pleasure to meet you [name].  Your symptoms are likely from a viral upper respiratory infection like the "common cold."  Please continue to stay well-hydrated. You can try the following for symptom relief: - Flonase 2 sprays each nostril once daily - Tessalon perles three times daily as needed for cough - Nasal saline spray throughout the day as needed - Tylenol and Benadryl as needed  Please follow up with Dr. Berline Lopes as scheduled.    Viral Respiratory Infection A viral respiratory infection is an illness that affects parts of the body used for breathing, like the lungs, nose, and throat. It is caused by a germ called a virus. Some examples of this kind of infection are:  A cold.  The flu (influenza).  A respiratory syncytial virus (RSV) infection.  How do I know if I have this infection? Most of the time this infection causes:  A stuffy or runny nose.  Yellow or green fluid in the nose.  A cough.  Sneezing.  Tiredness (fatigue).  Achy muscles.  A sore throat.  Sweating or chills.  A fever.  A headache.  How is this infection treated? If the flu is diagnosed early, it may be treated with an antiviral medicine. This medicine shortens the length of time a person has symptoms. Symptoms may be treated with over-the-counter and prescription medicines, such as:  Expectorants. These make it easier to cough up mucus.  Decongestant nasal sprays.  Doctors do not prescribe antibiotic medicines for viral infections. They do not work with this kind of infection. How do I know if I should stay home? To keep others from getting sick, stay home if you have:  A fever.  A lasting cough.  A sore throat.  A runny nose.  Sneezing.  Muscles aches.  Headaches.  Tiredness.  Weakness.  Chills.  Sweating.  An upset stomach (nausea).  Follow these instructions at home:  Rest as much as possible.  Take over-the-counter and prescription  medicines only as told by your doctor.  Drink enough fluid to keep your pee (urine) clear or pale yellow.  Gargle with salt water. Do this 3-4 times per day or as needed. To make a salt-water mixture, dissolve -1 tsp of salt in 1 cup of warm water. Make sure the salt dissolves all the way.  Use nose drops made from salt water. This helps with stuffiness (congestion). It also helps soften the skin around your nose.  Do not drink alcohol.  Do not use tobacco products, including cigarettes, chewing tobacco, and e-cigarettes. If you need help quitting, ask your doctor. Get help if:  Your symptoms last for 10 days or longer.  Your symptoms get worse over time.  You have a fever.  You have very bad pain in your face or forehead.  Parts of your jaw or neck become very swollen. Get help right away if:  You feel pain or pressure in your chest.  You have shortness of breath.  You faint or feel like you will faint.  You keep throwing up (vomiting).  You feel confused. This information is not intended to replace advice given to you by your health care provider. Make sure you discuss any questions you have with your health care provider. Document Released: 05/24/2008 Document Revised: 11/17/2015 Document Reviewed: 11/17/2014 Elsevier Interactive Patient Education  2018 [employer].

## 2017-03-29 NOTE — Progress Notes (Signed)
   CC: cough  HPI:  [name] is a 71 y.o. female with PMH as listed below including ESRD s/p Renal Transplant (on prednisone, tacrolimus, mycophenolate, and Bactrim), HTN, DM, and obesity who presents for evaluation of URI.  Viral URI: Patient reports visiting family in September in [place]. She was at a pool party for her grandson. She says she got caught in the rain and began having symptoms of sore throat, cough, rhinorrhea, congestion, and hoarseness of voice which were at its worst for 1 week. She says her symptoms are improving but still has residual dry cough, hoarseness, feeling of mucus at the bottom of her throat, and nasal congestion. She has used Benadryl and Tylenol with some relief.   Influenza Immunization: Patient requesting flu shot today.    Past Medical History:  Diagnosis Date  . Allergy   . Blood transfusion without reported diagnosis   . Cataract   . Clotting disorder ([place])   . Diabetes mellitus   . Hypertension   . Morbid obesity (HCC)    BMI 34  . Renal disorder   . Thyroid disease   . Ulcer    Review of Systems:   Review of Systems  Constitutional: Positive for chills and diaphoresis. Negative for fever.  HENT: Positive for congestion and sore throat. Negative for ear discharge, ear pain, nosebleeds and sinus pain.   Respiratory: Positive for cough. Negative for hemoptysis, sputum production and shortness of breath.   Cardiovascular: Negative for chest pain.     Physical Exam:  Vitals:   03/29/17 1031  BP: 130/73  Pulse: 88  Temp: 98.3 F (36.8 C)  TempSrc: Oral  SpO2: 100%  Weight: 230 lb 6.4 oz (104.5 kg)  Height: 5' 7.5" (1.715 m)   Physical Exam  Constitutional: She is oriented to person, place, and time. She appears well-developed and well-nourished. No distress.  HENT:  Head: Normocephalic and atraumatic.  Mouth/Throat: Oropharynx is clear and moist.  Mild cerumen bilateral ears, otherwise no effusion, discharge, or  inflammation. Mild mucosal edema of turbinates with scant clear/white discharge. No sinus tenderness.  Eyes: Conjunctivae are normal.  Neck: Neck supple.  Cardiovascular: Normal rate and regular rhythm.   Pulmonary/Chest: Effort normal. No respiratory distress. She has no wheezes. She has no rales.  Neurological: She is alert and oriented to person, place, and time.  Skin: Skin is warm. She is not diaphoretic.    Assessment & Plan:   See Encounters Tab for problem based charting.  Patient discussed with Dr. Lynnae January  Viral URI Patient likely has a viral URI which is resolving. She is afebrile, normotensive, and saturating 100% on room air. She has mild mucosal edema of her turbinates, otherwise lungs and oropharynx are clear. Will recommend conservative management. - Flonase 2 sprays each nostril daily - Tessalon prn - Nasal saline irrigation - Continue Tylenol and Benadryl prn  Need for immunization against influenza Flu shot given today.

## 2017-04-01 NOTE — Progress Notes (Signed)
Internal Medicine Clinic Attending  Case discussed with Dr. Patel at the time of the visit.  We reviewed the resident's history and exam and pertinent patient test results.  I agree with the assessment, diagnosis, and plan of care documented in the resident's note.  

## 2017-04-29 ENCOUNTER — Other Ambulatory Visit: Payer: Self-pay

## 2017-04-29 NOTE — Telephone Encounter (Signed)
levothyroxine (SYNTHROID, LEVOTHROID) 125 MCG tablet, refill request @ [facility] on [address].

## 2017-04-30 ENCOUNTER — Other Ambulatory Visit: Payer: Self-pay | Admitting: Dietician

## 2017-04-30 MED ORDER — LEVOTHYROXINE SODIUM 125 MCG PO TABS: 125.0000 ug | ORAL_TABLET | Freq: Every day | ORAL | 0 refills | Status: DC

## 2017-04-30 NOTE — Telephone Encounter (Signed)
Patient needs to be seen on 11/29. Will refill this once.

## 2017-04-30 NOTE — Telephone Encounter (Addendum)
Needs levothyroxine as soon as possible she is out for two days. . Told her that Dr. Berline Lopes filled it this morning.

## 2017-05-08 DIAGNOSIS — Z940 Kidney transplant status: Secondary | ICD-10-CM | POA: Diagnosis not present

## 2017-05-08 DIAGNOSIS — Z794 Long term (current) use of insulin: Secondary | ICD-10-CM | POA: Diagnosis not present

## 2017-05-08 DIAGNOSIS — D8989 Other specified disorders involving the immune mechanism, not elsewhere classified: Secondary | ICD-10-CM | POA: Diagnosis not present

## 2017-05-08 DIAGNOSIS — Z86718 Personal history of other venous thrombosis and embolism: Secondary | ICD-10-CM | POA: Diagnosis not present

## 2017-05-08 DIAGNOSIS — Z79899 Other long term (current) drug therapy: Secondary | ICD-10-CM | POA: Diagnosis not present

## 2017-05-08 DIAGNOSIS — Z7901 Long term (current) use of anticoagulants: Secondary | ICD-10-CM | POA: Diagnosis not present

## 2017-05-08 DIAGNOSIS — Z7952 Long term (current) use of systemic steroids: Secondary | ICD-10-CM | POA: Diagnosis not present

## 2017-05-08 DIAGNOSIS — Z792 Long term (current) use of antibiotics: Secondary | ICD-10-CM | POA: Diagnosis not present

## 2017-05-08 DIAGNOSIS — I714 Abdominal aortic aneurysm, without rupture: Secondary | ICD-10-CM | POA: Diagnosis not present

## 2017-05-08 DIAGNOSIS — I10 Essential (primary) hypertension: Secondary | ICD-10-CM | POA: Diagnosis not present

## 2017-05-08 DIAGNOSIS — Z4822 Encounter for aftercare following kidney transplant: Secondary | ICD-10-CM | POA: Diagnosis not present

## 2017-05-08 DIAGNOSIS — E119 Type 2 diabetes mellitus without complications: Secondary | ICD-10-CM | POA: Diagnosis not present

## 2017-05-08 DIAGNOSIS — D649 Anemia, unspecified: Secondary | ICD-10-CM | POA: Diagnosis not present

## 2017-05-08 DIAGNOSIS — Z5181 Encounter for therapeutic drug level monitoring: Secondary | ICD-10-CM | POA: Diagnosis not present

## 2017-05-13 ENCOUNTER — Encounter: Payer: Self-pay | Admitting: Internal Medicine

## 2017-05-13 ENCOUNTER — Ambulatory Visit (INDEPENDENT_AMBULATORY_CARE_PROVIDER_SITE_OTHER): Payer: Medicare Other | Admitting: Internal Medicine

## 2017-05-13 ENCOUNTER — Other Ambulatory Visit: Payer: Self-pay

## 2017-05-13 ENCOUNTER — Ambulatory Visit (HOSPITAL_COMMUNITY)
Admission: RE | Admit: 2017-05-13 | Discharge: 2017-05-13 | Disposition: A | Discharge status: Home / Self Care | Payer: Medicare Other | Source: Ambulatory Visit | Attending: Internal Medicine | Admitting: Internal Medicine

## 2017-05-13 VITALS — BP 154/65 | HR 92 | Temp 98.1°F | Ht 67.0 in | Wt 232.1 lb

## 2017-05-13 DIAGNOSIS — Z87891 Personal history of nicotine dependence: Secondary | ICD-10-CM | POA: Diagnosis not present

## 2017-05-13 DIAGNOSIS — M898X7 Other specified disorders of bone, ankle and foot: Secondary | ICD-10-CM

## 2017-05-13 DIAGNOSIS — R937 Abnormal findings on diagnostic imaging of other parts of musculoskeletal system: Secondary | ICD-10-CM | POA: Insufficient documentation

## 2017-05-13 DIAGNOSIS — M19072 Primary osteoarthritis, left ankle and foot: Secondary | ICD-10-CM | POA: Diagnosis not present

## 2017-05-13 DIAGNOSIS — M79672 Pain in left foot: Secondary | ICD-10-CM | POA: Insufficient documentation

## 2017-05-13 NOTE — Patient Instructions (Signed)
[name],  I would like to get an xray of your foot today. I will let you know what the results are.

## 2017-05-13 NOTE — Progress Notes (Signed)
   CC: left foot pain  HPI:  [name] is a 71 y.o. female with a past medical history listed below here today with complaints of left foot pain.   She reports that she noticed pain with walking in her left foot 2-3 days ago. Does not recall any trauma or injury. Reports pain over her first metatarsal/medial cuneiform region of her left foot when she applies any pressure to the area. Does not have pain while at rest. Has been taking tylenol with some improvement in pain. Denies any erythema, warmth, rashes, ulcers. No fevers or chills.   Past Medical History:  Diagnosis Date  . Allergy   . Blood transfusion without reported diagnosis   . Cataract   . Clotting disorder ([place])   . Diabetes mellitus   . Hypertension   . Morbid obesity (HCC)    BMI 34  . Renal disorder   . Thyroid disease   . Ulcer    Review of Systems:  Negative except as noted in HPI  Physical Exam:  Vitals:   05/13/17 1627  BP: (!) 154/65  Pulse: 92  Temp: 98.1 F (36.7 C)  TempSrc: Oral  SpO2: 98%  Weight: 232 lb 1.6 oz (105.3 kg)  Height: 5\' 7"  (1.702 m)   Physical Exam  Constitutional: She is well-developed, well-nourished, and in no distress. No distress.  HENT:  Head: Normocephalic and atraumatic.  Cardiovascular: Normal rate and regular rhythm.  Pulmonary/Chest: Effort normal and breath sounds normal.  Musculoskeletal:       Left ankle: Normal.       Left foot: There is tenderness and bony tenderness. There is normal range of motion, no swelling, no deformity and no laceration.       Feet:  Intact sensation to light touch and proprioception in bilateral feet  Skin: Skin is warm and dry. No rash noted. No erythema.  Vitals reviewed.    Assessment & Plan:   See Encounters Tab for problem based charting.  Patient discussed with Dr. Angelia Mould

## 2017-05-13 NOTE — Assessment & Plan Note (Signed)
Acute pain in her left first metatarsal/medial cuneiform bone region. She has tenderness to palpation over this region and when bearing weight on this foot. No skin lesions or ulcerations. No warmth or erythema to suggest crystal disease.  Will get XR today to assess for fracture. If negative, will continue RICE therapy.

## 2017-05-14 ENCOUNTER — Telehealth: Payer: Self-pay | Admitting: Internal Medicine

## 2017-05-14 NOTE — Progress Notes (Signed)
Internal Medicine Clinic Attending  Case discussed with Dr. Boswell at the time of the visit.  We reviewed the resident's history and exam and pertinent patient test results.  I agree with the assessment, diagnosis, and plan of care documented in the resident's note.  

## 2017-05-14 NOTE — Telephone Encounter (Signed)
Patient is calling on her foot x-ray on yesterday

## 2017-05-14 NOTE — Telephone Encounter (Signed)
Tried calling with no answer. Patient's xray without any fractures. Continue Tylenol as needed for pain. Please have patient schedule regular appointment with PCP.

## 2017-05-20 NOTE — Telephone Encounter (Signed)
Pt informed of results and had some additional questions about the imaging-pt instructed to keep appt with pcp on 11/29 and he could address any additional questions she has about the xrays-pt agreed.Despina Hidden Cassady11/26/20182:17 PM

## 2017-05-23 ENCOUNTER — Encounter: Payer: Medicare Other | Admitting: Internal Medicine

## 2017-05-28 ENCOUNTER — Other Ambulatory Visit: Payer: Self-pay

## 2017-05-28 NOTE — Telephone Encounter (Signed)
levothyroxine (SYNTHROID, LEVOTHROID) 125 MCG tablet, refill request @ [facility] on [address].

## 2017-05-29 ENCOUNTER — Ambulatory Visit: Payer: Medicare Other | Admitting: Podiatry

## 2017-05-29 NOTE — Telephone Encounter (Signed)
Pt states she needs levothyroxine by today. She will be out as of today. Please call pt back.

## 2017-05-29 NOTE — Progress Notes (Signed)
The patient has missed several scheduled appointments with me by either not showing or canceling.  I will be unable to refill any of her medications in the future until she is seen in our clinic and evaluated for a routine visit.  She canceled me on 11/29 2018, canceled again on 10/02, 9/28, and failed to show on 9/12.  She canceled again on 9/05 and again failed to show on 7/20. As such, this will be the last refill I am comfortable with until a routine visit is arranged with one of our doctors as we can not safely provide her with medical care if she can not be seen.

## 2017-05-30 MED ORDER — LEVOTHYROXINE SODIUM 125 MCG PO TABS: 125.0000 ug | ORAL_TABLET | Freq: Every day | ORAL | 0 refills | Status: DC

## 2017-06-07 ENCOUNTER — Ambulatory Visit (INDEPENDENT_AMBULATORY_CARE_PROVIDER_SITE_OTHER): Payer: Medicare Other | Admitting: Internal Medicine

## 2017-06-07 ENCOUNTER — Other Ambulatory Visit: Payer: Self-pay

## 2017-06-07 ENCOUNTER — Encounter: Payer: Self-pay | Admitting: Internal Medicine

## 2017-06-07 VITALS — BP 133/74 | HR 84 | Temp 99.0°F | Ht 67.0 in | Wt 233.9 lb

## 2017-06-07 DIAGNOSIS — Z79890 Hormone replacement therapy (postmenopausal): Secondary | ICD-10-CM | POA: Diagnosis not present

## 2017-06-07 DIAGNOSIS — E039 Hypothyroidism, unspecified: Secondary | ICD-10-CM | POA: Diagnosis not present

## 2017-06-07 DIAGNOSIS — Z794 Long term (current) use of insulin: Secondary | ICD-10-CM | POA: Diagnosis not present

## 2017-06-07 DIAGNOSIS — I10 Essential (primary) hypertension: Secondary | ICD-10-CM | POA: Diagnosis not present

## 2017-06-07 DIAGNOSIS — Z87891 Personal history of nicotine dependence: Secondary | ICD-10-CM

## 2017-06-07 DIAGNOSIS — Z79899 Other long term (current) drug therapy: Secondary | ICD-10-CM | POA: Diagnosis not present

## 2017-06-07 DIAGNOSIS — E1143 Type 2 diabetes mellitus with diabetic autonomic (poly)neuropathy: Secondary | ICD-10-CM

## 2017-06-07 DIAGNOSIS — I714 Abdominal aortic aneurysm, without rupture, unspecified: Secondary | ICD-10-CM

## 2017-06-07 DIAGNOSIS — E118 Type 2 diabetes mellitus with unspecified complications: Secondary | ICD-10-CM

## 2017-06-07 DIAGNOSIS — Z940 Kidney transplant status: Secondary | ICD-10-CM

## 2017-06-07 DIAGNOSIS — K219 Gastro-esophageal reflux disease without esophagitis: Secondary | ICD-10-CM

## 2017-06-07 MED ORDER — LEVOTHYROXINE SODIUM 125 MCG PO TABS: 125.0000 ug | ORAL_TABLET | Freq: Every day | ORAL | 2 refills | Status: DC

## 2017-06-07 MED ORDER — ASPIRIN 81 MG PO TBEC: DELAYED_RELEASE_TABLET | ORAL | 3 refills | Status: DC

## 2017-06-07 MED ORDER — INSULIN GLARGINE 100 UNIT/ML SOLOSTAR PEN: PEN_INJECTOR | SUBCUTANEOUS | 5 refills | Status: DC

## 2017-06-07 MED ORDER — ACCU-CHEK SOFTCLIX LANCETS MISC: 12 refills | Status: DC

## 2017-06-07 MED ORDER — INSULIN ASPART 100 UNIT/ML FLEXPEN: PEN_INJECTOR | SUBCUTANEOUS | 3 refills | Status: DC

## 2017-06-07 MED ORDER — GLUCOSE BLOOD VI STRP: ORAL_STRIP | 11 refills | Status: DC

## 2017-06-07 MED ORDER — INSULIN PEN NEEDLE 32G X 4 MM MISC: 3 refills | Status: DC

## 2017-06-07 MED ORDER — PANTOPRAZOLE SODIUM 40 MG PO TBEC: 40.0000 mg | DELAYED_RELEASE_TABLET | Freq: Every day | ORAL | 11 refills | Status: DC

## 2017-06-07 MED ORDER — FUROSEMIDE 40 MG PO TABS: 40.0000 mg | ORAL_TABLET | Freq: Every day | ORAL | 3 refills | Status: DC

## 2017-06-07 NOTE — Patient Instructions (Signed)
Thank you for visiting clinic today. As we talked we will check your thyroid levels today and adjust your doses if needed accordingly. Please continue to take all your medicines regularly. We will try to accommodate you with a primary care doctor with morning appointments. Please follow-up in 3 months with your primary care.

## 2017-06-07 NOTE — Assessment & Plan Note (Signed)
She denies any abdominal pain. To follow-up with vascular surgery, gets yearly CT abdomen.  Last scan was in June 2018 which shows mild enlargement of aortic aneurysm at 5.8 x 4.6 cm. According to patient her vascular surgeon is planning a procedure next year.

## 2017-06-07 NOTE — Assessment & Plan Note (Signed)
BP Readings from Last 3 Encounters:  06/07/17 133/74  05/13/17 (!) 154/65  03/29/17 130/73   Her blood pressure was within goal. She does take Lasix 40 mg daily, she ran out of her meds for a few days.  She was provided with refills. Continue current management.

## 2017-06-07 NOTE — Progress Notes (Signed)
   CC: For follow-up of her diabetes, hypertension and hypothyroidism.  HPI:  [name] is a 71 y.o. with past medical history as listed below came to the clinic for follow-up of her chronic conditions and for refills.  She missed multiple appointments with PCP stating that she cannot come to the clinic in the afternoon.  She wants a change in PCP with a morning appointment.  She has no complaints today. According to patient earlier this week she was outdoors in the snow without much clothing and all of sudden developed muscle cramping, went inside and checked her temperature and it was 90 F, she drank hot beverages and wrapped herself with warm blankets-with then 1-2 hours her temperature was 96 and her muscle cramping resolved.  Past Medical History:  Diagnosis Date  . Allergy   . Blood transfusion without reported diagnosis   . Cataract   . Clotting disorder ([place])   . Diabetes mellitus   . Hypertension   . Morbid obesity (H[facility])    BMI 34  . Renal disorder   . Thyroid disease   . Ulcer    Review of Systems:   General: Denies fever, chills, fatigue, unexpected weight loss, change in appetite and diaphoresis.  Respiratory: Denies SOB, cough, DOE, chest tightness, and wheezing.   Cardiovascular: Denies chest pain and palpitations.  Gastrointestinal: Denies nausea, vomiting, abdominal pain, diarrhea, constipation, blood in stool and abdominal distention.  Genitourinary: Denies dysuria, urgency, frequency, hematuria, suprapubic pain and flank pain. Endocrine: Denies hot or cold intolerance, polyuria, and polydipsia. Musculoskeletal: Denies myalgias, back pain, joint swelling, arthralgias and gait problem.  Neurological: Denies dizziness, headaches, weakness, lightheadedness, numbness, seizures, and syncope. Psychiatric/Behavioral: Denies mood changes, confusion, nervousness, sleep disturbance and agitation.   Physical Exam:  Vitals:   06/07/17 1006  BP: 133/74    Pulse: 84  Temp: 99 F (37.2 C)  TempSrc: Oral  SpO2: 100%  Weight: 233 lb 14.4 oz (106.1 kg)  Height: 5\' 7"  (1.702 m)    General: Vital signs reviewed.  Patient is well-developed and well-nourished, in no acute distress and cooperative with exam.  Head: Normocephalic and atraumatic. Eyes: EOMI, conjunctivae normal, no scleral icterus.  Cardiovascular: RRR, S1 normal, S2 normal, no murmurs, gallops, or rubs. Pulmonary/Chest: Clear to auscultation bilaterally, no wheezes, rales, or rhonchi. Abdominal: Soft, non-tender, non-distended, BS +, no masses, organomegaly, or guarding present.  Extremities: 1+ lower extremity edema bilaterally,  pulses symmetric and intact bilaterally.  Psychiatric: Normal mood and affect. speech and behavior is normal. Cognition and memory are normal.  Assessment & Plan:   See Encounters Tab for problem based charting.  Patient discussed with Dr. Beryle Beams.

## 2017-06-07 NOTE — Assessment & Plan Note (Signed)
She follow-up with [facility] nephrology and compliant with her medications.  Continue follow-up with nephrology and current medications.

## 2017-06-07 NOTE — Assessment & Plan Note (Signed)
She had her A1c checked in May 08, 2017 by her nephrologist at [facility] and it was 6.8.  Her previous A1c checked in our clinic one year ago was 6.5.  She has well-controlled diabetes.  Continue with current management of Lantus 35 units at bedtime and NovoLog 6 units with biggest meal. Refills were provided.

## 2017-06-07 NOTE — Assessment & Plan Note (Addendum)
She denies any heat or cold intolerance. Her Synthroid dose was increased to 125 mcg last year.  We will check her TSH-we will titrate her dose accordingly. A prescription for Synthroid 125 mcg was provided as she was out of her prescription.  Addendum.  Her TSH level was low at 0.336, called the patient and decreased the dose of her Synthroid to 100 mcg daily.  She was advised to come back in 6-8 weeks for a repeat TSH.

## 2017-06-07 NOTE — Progress Notes (Signed)
Medicine attending: Medical history, presenting problems, physical findings, and medications, reviewed with resident physician Dr Sumayya Amin on the day of the patient visit and I concur with her evaluation and management plan. 

## 2017-06-08 LAB — TSH: TSH: 0.336 u[IU]/mL — ABNORMAL LOW (ref 0.450–4.500)

## 2017-06-09 MED ORDER — LEVOTHYROXINE SODIUM 100 MCG PO TABS: 100.0000 ug | ORAL_TABLET | Freq: Every day | ORAL | 11 refills | Status: DC

## 2017-06-09 NOTE — Addendum Note (Signed)
Addended by: Lorella Nimrod on: 06/09/2017 08:17 PM   Modules accepted: Orders

## 2017-06-11 ENCOUNTER — Telehealth: Payer: Self-pay | Admitting: Student in an Organized Health Care Education/Training Program

## 2017-06-11 ENCOUNTER — Telehealth: Payer: Self-pay | Admitting: *Deleted

## 2017-06-11 NOTE — Telephone Encounter (Signed)
Butch Penny. Can you help me figure out why this patient's FSG supplies were denied by medicare? She has insulin-dependent diabetes. I would like her to check three times daily. Thanks.

## 2017-06-11 NOTE — Telephone Encounter (Signed)
Pt states her insurance will not cover Accu-chek test strips and lancets when she tried to pick them up at [place].. I will send to [place] to f/u.

## 2017-06-11 NOTE — Telephone Encounter (Signed)
Patient pharmacy is saying that they will not fill have diabetics supplies.  Pls call patient

## 2017-06-11 NOTE — Telephone Encounter (Addendum)
Call to [facility] to inquire about need for PA for Diabetic supplies.  DW form needed.  Pharmacy to fax over.  Form was placed in Dr. Latina Craver box for completion and message was sent to Dr. Reesa Chew to notify her of need to complete the forms.  Sander Nephew, RN 06/11/2017 9:13 AM. Forms were received from patient's pharmacy and placed in Dr. Latina Craver per order of Dr. Lynnae January for completion.  Message sent to Dr. Reesa Chew to complete forms for patient's Diabetic supplies.  Sander Nephew, RN . 06/11/2017 9:40 AM  Call to patient message was left on patient's voice mail that forms were faxed from her Pharmacy for completion and were placed in Dr. Latina Craver box.  Sander Nephew, RN 06/11/2017 11:00 AM.

## 2017-06-13 ENCOUNTER — Telehealth: Payer: Self-pay

## 2017-06-13 NOTE — Telephone Encounter (Signed)
[place] @ [phone].

## 2017-06-14 NOTE — Telephone Encounter (Signed)
Call to pharmacy about testing supplies not being covered:both her strips and lancets sound like they are covered- $3.30 for 100 strips and $0.28 for the lancets. They had to order the strips and they will be in after 6 PM today. Patient notified.

## 2017-06-14 NOTE — Telephone Encounter (Signed)
Thank you. I filled out a prior auth for the lancets today as well.

## 2017-07-05 ENCOUNTER — Telehealth: Payer: Self-pay | Admitting: Student in an Organized Health Care Education/Training Program

## 2017-07-05 NOTE — Telephone Encounter (Signed)
PT CALLED AND SAID HER TESTING STRIPS FOR 2019 UNDER MEDICARE COVERAGE WAS 50.00 WHICH SHE CAN NOT AFFORD

## 2017-07-08 NOTE — Telephone Encounter (Signed)
Patient says she never picked up strips in December and now [facility] says they are 50.00. She has [employer] part D Medicare and Medicaid, however, strips usually fall under Medicare part B. She tried to get strips 2 days ago.  Call to pharmacy about cost, he is running it through Medicare B and cost is $49.92. He cannot say why.

## 2017-07-19 ENCOUNTER — Other Ambulatory Visit: Payer: Self-pay

## 2017-07-19 ENCOUNTER — Encounter: Payer: Self-pay | Admitting: Internal Medicine

## 2017-07-19 ENCOUNTER — Encounter (HOSPITAL_COMMUNITY): Payer: Self-pay

## 2017-07-19 ENCOUNTER — Ambulatory Visit (INDEPENDENT_AMBULATORY_CARE_PROVIDER_SITE_OTHER): Payer: Medicare Other | Admitting: Internal Medicine

## 2017-07-19 ENCOUNTER — Emergency Department (HOSPITAL_COMMUNITY)
Admission: EM | Admit: 2017-07-19 | Discharge: 2017-07-20 | Disposition: A | Discharge status: Home / Self Care | Payer: Medicare Other | Attending: Emergency Medicine | Admitting: Emergency Medicine

## 2017-07-19 DIAGNOSIS — H5789 Other specified disorders of eye and adnexa: Secondary | ICD-10-CM | POA: Insufficient documentation

## 2017-07-19 DIAGNOSIS — H18411 Arcus senilis, right eye: Secondary | ICD-10-CM | POA: Diagnosis not present

## 2017-07-19 DIAGNOSIS — Z940 Kidney transplant status: Secondary | ICD-10-CM | POA: Diagnosis not present

## 2017-07-19 DIAGNOSIS — H538 Other visual disturbances: Secondary | ICD-10-CM | POA: Diagnosis not present

## 2017-07-19 DIAGNOSIS — Z87891 Personal history of nicotine dependence: Secondary | ICD-10-CM | POA: Diagnosis not present

## 2017-07-19 DIAGNOSIS — H579 Unspecified disorder of eye and adnexa: Secondary | ICD-10-CM | POA: Diagnosis not present

## 2017-07-19 DIAGNOSIS — H5711 Ocular pain, right eye: Secondary | ICD-10-CM

## 2017-07-19 DIAGNOSIS — E039 Hypothyroidism, unspecified: Secondary | ICD-10-CM | POA: Diagnosis not present

## 2017-07-19 DIAGNOSIS — I10 Essential (primary) hypertension: Secondary | ICD-10-CM | POA: Diagnosis not present

## 2017-07-19 DIAGNOSIS — Z79899 Other long term (current) drug therapy: Secondary | ICD-10-CM | POA: Insufficient documentation

## 2017-07-19 DIAGNOSIS — Z794 Long term (current) use of insulin: Secondary | ICD-10-CM | POA: Insufficient documentation

## 2017-07-19 DIAGNOSIS — R51 Headache: Secondary | ICD-10-CM | POA: Diagnosis not present

## 2017-07-19 DIAGNOSIS — E119 Type 2 diabetes mellitus without complications: Secondary | ICD-10-CM | POA: Diagnosis not present

## 2017-07-19 NOTE — ED Notes (Signed)
PA student at bedside.

## 2017-07-19 NOTE — Patient Instructions (Signed)
Sent to ED

## 2017-07-19 NOTE — Progress Notes (Signed)
   CC: Eye pain and redness   HPI:  [name] is a 72 y.o. female with a past medical history as described below who presents to the clinic with complaints of eye pain and redness.   She reports a 2-day history of right eye pain and irritation with associated headache which is improved with Tylenol.  Her pain worsens with movement of the eye.  She has associated watery discharge, no yellow or green purulent.  She has not had recent illness, no sick contacts, no rashes or skin lesions on her face or elsewhere.  She attributes her symptoms to spraying Lysol earlier on the day of onset.  She has tried over-the-counter Visine eyedrops without relief.  She reports her vision is decreased from her baseline (already poor due to underlying macular degeneration).  She has previously received retinal injections but has not had any injections or other ophthalmology procedures in several years.  Past Medical History:  Diagnosis Date  . Allergy   . Blood transfusion without reported diagnosis   . Cataract   . Clotting disorder ([place])   . Diabetes mellitus   . Hypertension   . Morbid obesity (H[facility])    BMI 34  . Renal disorder   . Thyroid disease   . Ulcer    Review of Systems:  Review of Systems  Constitutional: Negative for fever.  Eyes: Positive for blurred vision, pain, discharge and redness.  Respiratory: Negative for cough.      Physical Exam:  Vitals:   07/19/17 1535  BP: (!) 160/100  Pulse: (!) 101  Temp: 98.6 F (37 C)  TempSrc: Oral  SpO2: 99%  Weight: 237 lb 12.8 oz (107.9 kg)  Height: 5\' 7"  (1.702 m)   General: Sitting on exam table comfortably, no acute distress HEENT: Marked scleral injection of right eye with watery discharge, extraocular movements intact with pain on testing, right pupil small and minimally reactive.  Visual acuity markedly decreased with Snellen eye chart (unable to read any letters).  Appears to have arcus senilis or other changes around iris.  No foreign body appreciated  Resp:,Normal work of breathing, no distress  Neuro: Alert and oriented x3  Skin: Warm, dry. No facial skin lesions     Assessment & Plan:   See Encounters Tab for problem based charting.  Patient discussed with Dr. Lynnae January

## 2017-07-19 NOTE — Assessment & Plan Note (Addendum)
Patient presenting with a 2-day history of right eye pain, redness, and decreased visual acuity from her baseline.  This may be a chemical conjunctivitis related to Lysol exposure or a viral conjunctivitis.  However, her scleral injection on exam is significant and, along with her associated pain and decreased acuity, we feel this may require further workup rather than empiric treatment and follow-up-- Especially given her immunosuppression for her kidney transplant, in case this is a more severe infectious process. Closed angle glaucoma also on differential for this presentation, no known history.  The on-call ophthalmologist was contacted over the phone who recommended an initial workup plan of pH testing and irrigation, however pH strips are unavailable in the clinic and if she were to need further evaluation such as fluoroscein, slit-lamp this is also unavailable in our clinic and beyond our typical scope of practice.  Ophthalmology recommended sending patient to the ED to begin this workup plan.  The patient was agreeable to this plan and sent to the emergency department, ED charge nurse notified of upcoming arrival.

## 2017-07-19 NOTE — ED Triage Notes (Signed)
Pt sent by internal medicine. Pt endorses using lysol in her house and got it in her right eye. Redness and irritation noted to right eye. VSS.

## 2017-07-20 DIAGNOSIS — H5789 Other specified disorders of eye and adnexa: Secondary | ICD-10-CM | POA: Diagnosis not present

## 2017-07-20 MED ORDER — FLUORESCEIN SODIUM 1 MG OP STRP
1.0000 | ORAL_STRIP | Freq: Once | OPHTHALMIC | Status: AC
  Administered 2017-07-20: 1 via OPHTHALMIC
  Filled 2017-07-20: qty 1

## 2017-07-20 MED ORDER — TETRACAINE HCL 0.5 % OP SOLN
1.0000 [drp] | Freq: Once | OPHTHALMIC | Status: AC
  Administered 2017-07-20: 1 [drp] via OPHTHALMIC
  Filled 2017-07-20: qty 4

## 2017-07-20 MED ORDER — POLYMYXIN B-TRIMETHOPRIM 10000-0.1 UNIT/ML-% OP SOLN
1.0000 [drp] | OPHTHALMIC | 0 refills | Status: DC
Start: 2017-07-20 — End: 2020-03-04

## 2017-07-20 NOTE — ED Provider Notes (Signed)
[facility] EMERGENCY DEPARTMENT Provider Note   CSN: [id number] Arrival date & time: 07/19/17  1701     History   Chief Complaint No chief complaint on file.   HPI [name] is a 72 y.o. female.  Patient presents to the ED with a chief complaint of eye injury.  She states that she was cleaning 3 days ago and got some Lysol spray in her eye.  She states that she has some eye pain that has persisted and also has foreign body sensation.  She states that she has poor vision at baseline, but reports that it is slightly worse now.  She has not taken anything for the symptoms.  She states the symptoms have improved since checking into the ER.   The history is provided by the patient. No language interpreter was used.    Past Medical History:  Diagnosis Date  . Allergy   . Blood transfusion without reported diagnosis   . Cataract   . Clotting disorder ([place])   . Diabetes mellitus   . Hypertension   . Morbid obesity (HCC)    BMI 34  . Renal disorder   . Thyroid disease   . Ulcer     Patient Active Problem List   Diagnosis Date Noted  . Eye pain, right 07/19/2017  . Left foot pain 05/13/2017  . Need for immunization against influenza 03/29/2017  . 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 Lipodermatosclerosis 05/10/2015  . Dental caries 06/24/2014  . Health care maintenance 06/08/2014  . Hypothyroidism 06/01/2014  . Hypertension 06/01/2014  . Hepatic lesion 08/06/2013  . Diabetic neuropathy ([place]) 05/08/2013  . History of kidney transplant 05/08/2013  . Dyslipidemia 07/30/2011  . AAA (abdominal aortic aneurysm) without rupture ([place]) 07/09/2011  . 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 ABDOMINAL AORTIC ANEURYSM REPAIR    . KIDNEY TRANSPLANT      OB History    Gravida Para Term Preterm AB Living   3 3 3     3    SAB TAB  Ectopic Multiple Live Births                   Home Medications    Prior to Admission medications   Medication Sig Start Date End Date Taking? Authorizing Provider  aspirin (RA ASPIRIN EC) 81 MG EC tablet take 1 tablet by mouth EVERY EVENING: 06/07/17  Yes Lorella Nimrod, MD  Cholecalciferol 1000 UNITS tablet Take 1,000 Units by mouth. 06/24/14  Yes [provider]  furosemide (LASIX) 40 MG tablet Take 1 tablet (40 mg total) by mouth daily. 06/07/17  Yes Lorella Nimrod, MD  insulin aspart (NOVOLOG FLEXPEN) 100 UNIT/ML FlexPen INJECT 6 UNITS WITH LARGEST MEAL OF DAY LUNCH OR DINNER Patient taking differently: Inject 6 Units into the skin daily. WITH LARGEST MEAL OF DAY 06/07/17  Yes Lorella Nimrod, MD  Insulin Glargine (LANTUS SOLOSTAR) 100 UNIT/ML Solostar Pen INJECT 35 UNITS INTO SKIN DAILY AT 10 PM 06/07/17  Yes Lorella Nimrod, MD  levothyroxine (SYNTHROID) 100 MCG tablet Take 1 tablet (100 mcg total) by mouth daily. 06/09/17 06/09/18 Yes Lorella Nimrod, MD  Magnesium 500 MG TABS Take 1,000 mg by mouth daily.    Yes [provider]  Multiple Vitamin (MULTIVITAMIN WITH MINERALS) TABS tablet Take 1 tablet by mouth daily.   Yes [provider]  mycophenolate (CELLCEPT) 250  MG capsule Take 2 capsules (500 mg total) by mouth 2 (two) times daily. 05/20/14  Yes Burns, Alexa R, MD  pantoprazole (PROTONIX) 40 MG tablet Take 1 tablet (40 mg total) by mouth daily. 06/07/17  Yes Lorella Nimrod, MD  pentoxifylline (TRENTAL) 400 MG CR tablet Take 400 mg by mouth 2 (two) times daily. 03/13/16  Yes [provider]  potassium chloride (KLOR-CON) 8 MEQ tablet Take 8 mEq by mouth 3 (three) times a week. Mon/Wed/Fri   Yes [provider]  sulfamethoxazole-trimethoprim (BACTRIM,SEPTRA) 400-80 MG per tablet Take 1 tablet by mouth every Monday, Wednesday, and Friday.   Yes [provider]  tacrolimus (PROGRAF) 1 MG capsule Take 4 capsules (4 mg total) by mouth 2  (two) times daily. Takes 4.5mg  total twice daily Patient taking differently: Take 5 mg by mouth 2 (two) times daily.  05/20/14  Yes Burns, Arloa Koh, MD  valACYclovir (VALTREX) 1000 MG tablet Take 1,000 mg by mouth daily. For 5 days as needed for rash on buttocks.   Yes [provider]  ACCU-CHEK SOFTCLIX LANCETS lancets Check blood sugar up to 3 times a day  as instructed 06/07/17   Lorella Nimrod, MD  acetaminophen (TYLENOL) 500 MG tablet Take 500 mg by mouth every 6 (six) hours as needed for pain.    [provider]  baclofen (LIORESAL) 10 MG tablet Take 0.5 tablets (5 mg total) by mouth daily as needed for muscle spasms. Patient not taking: Reported on 07/19/2017 04/18/16   Florinda Marker, MD  diphenhydrAMINE (BENADRYL) 25 MG tablet Take 25 mg by mouth as needed for sleep.    [provider]  Elastic Bandages & Supports (MEDICAL COMPRESSION STOCKINGS) [place] 2 Devices by Does not apply route daily. 02/24/15   Burns, Arloa Koh, MD  fluticasone (FLONASE) 50 MCG/ACT nasal spray Place 2 sprays into both nostrils daily. Patient not taking: Reported on 07/19/2017 03/29/17   Zada Finders, MD  glucose blood (ACCU-CHEK AVIVA) test strip Use to test blood sugars three times a day. Dx code:E11.43. 06/07/17   Lorella Nimrod, MD  Insulin Pen Needle 32G X 4 MM MISC Use to give insulin 4 times a day. Dx code: E11.43. 06/07/17   Lorella Nimrod, MD  pravastatin (PRAVACHOL) 40 MG tablet Take 1 tablet (40 mg total) by mouth every evening. Patient not taking: Reported on 07/19/2017 09/22/14   Florinda Marker, MD  sodium chloride (OCEAN) 0.65 % SOLN nasal spray Place 1 spray into both nostrils as needed for congestion. Patient not taking: Reported on 07/19/2017 03/29/17   Zada Finders, MD    Family History History reviewed. No pertinent family history.  Social History Social History   Tobacco Use  . Smoking status: Former Smoker    Last attempt to quit: 03/02/2009    Years since quitting: 8.3  .  Smokeless tobacco: Never Used  . Tobacco comment: Quit x 25yrs.  Substance Use Topics  . Alcohol use: Yes    Alcohol/week: 0.0 oz    Comment: Wine sometimes.  . Drug use: No     Allergies   Hectorol [doxercalciferol]; Other; Vitamin d analogs; Coumadin [warfarin sodium]; and Lisinopril   Review of Systems Review of Systems  All other systems reviewed and are negative.    Physical Exam Updated Vital Signs BP (!) 146/71 (BP Location: Right Arm)   Pulse 83   Temp 97.9 F (36.6 C) (Oral)   Resp 20   Ht 5\' 7"  (1.702 m)   Wt  107.5 kg (237 lb)   SpO2 99%   BMI 37.12 kg/m   Physical Exam  Constitutional: She is oriented to person, place, and time. She appears well-developed and well-nourished.  HENT:  Head: Normocephalic and atraumatic.  Eyes: Conjunctivae and EOM are normal.  Right sclera is injected and inflamed  Right eye pressure:16 Left eye pressure: 14  No FB.  No fluorescein uptake.  Neck: Normal range of motion.  Cardiovascular: Normal rate.  Pulmonary/Chest: Effort normal.  Abdominal: She exhibits no distension.  Musculoskeletal: Normal range of motion.  Neurological: She is alert and oriented to person, place, and time.  Skin: Skin is dry.  Psychiatric: She has a normal mood and affect. Her behavior is normal. Judgment and thought content normal.  Nursing note and vitals reviewed.    ED Treatments / Results  Labs (all labs ordered are listed, but only abnormal results are displayed) Labs Reviewed - No data to display  EKG  EKG Interpretation None       Radiology No results found.  Procedures Procedures (including critical care time)  Medications Ordered in ED Medications  tetracaine (PONTOCAINE) 0.5 % ophthalmic solution 1 drop (not administered)  fluorescein ophthalmic strip 1 strip (not administered)     Initial Impression / Assessment and Plan / ED Course  I have reviewed the triage vital signs and the nursing notes.  Pertinent  labs & imaging results that were available during my care of the patient were reviewed by me and considered in my medical decision making (see chart for details).     Patient with chemical eye irritation.  Was sprayed with lysol several days ago.  Eye exam shows minor irritation, but no abrasion.  Normal pressures.  Vision is described as slightly worse than normal.  Ophthalmology follow-up in 2 days.  Will cover with polytrim.  Final Clinical Impressions(s) / ED Diagnoses   Final diagnoses:  Eye irritation    ED Discharge Orders        Ordered    trimethoprim-polymyxin b (POLYTRIM) ophthalmic solution  Every 4 hours     07/20/17 0127       Montine Circle, PA-C 07/20/17 0131    Jola Schmidt, MD 07/20/17 [phone]

## 2017-07-20 NOTE — ED Notes (Signed)
Pt did wear glasses for test which she does not wear all the time

## 2017-07-23 NOTE — Progress Notes (Signed)
Internal Medicine Clinic Attending  Case discussed with Dr. Harden at the time of the visit.  We reviewed the resident's history and exam and pertinent patient test results.  I agree with the assessment, diagnosis, and plan of care documented in the resident's note.  

## 2017-08-07 DIAGNOSIS — H3530 Unspecified macular degeneration: Secondary | ICD-10-CM | POA: Diagnosis not present

## 2017-08-07 DIAGNOSIS — E113393 Type 2 diabetes mellitus with moderate nonproliferative diabetic retinopathy without macular edema, bilateral: Secondary | ICD-10-CM | POA: Diagnosis not present

## 2017-08-07 DIAGNOSIS — H2513 Age-related nuclear cataract, bilateral: Secondary | ICD-10-CM | POA: Diagnosis not present

## 2017-08-16 DIAGNOSIS — Z940 Kidney transplant status: Secondary | ICD-10-CM | POA: Diagnosis not present

## 2017-08-16 DIAGNOSIS — I8310 Varicose veins of unspecified lower extremity with inflammation: Secondary | ICD-10-CM | POA: Diagnosis not present

## 2017-08-19 ENCOUNTER — Encounter: Payer: Self-pay | Admitting: Student in an Organized Health Care Education/Training Program

## 2017-08-19 ENCOUNTER — Ambulatory Visit (INDEPENDENT_AMBULATORY_CARE_PROVIDER_SITE_OTHER): Payer: Medicare Other | Admitting: Student in an Organized Health Care Education/Training Program

## 2017-08-19 ENCOUNTER — Telehealth: Payer: Self-pay | Admitting: Dietician

## 2017-08-19 ENCOUNTER — Encounter: Payer: Self-pay | Admitting: Dietician

## 2017-08-19 ENCOUNTER — Ambulatory Visit (INDEPENDENT_AMBULATORY_CARE_PROVIDER_SITE_OTHER): Payer: Medicare Other | Admitting: Dietician

## 2017-08-19 ENCOUNTER — Other Ambulatory Visit: Payer: Self-pay

## 2017-08-19 VITALS — BP 135/75 | HR 88 | Temp 98.0°F | Wt 230.2 lb

## 2017-08-19 DIAGNOSIS — Z7952 Long term (current) use of systemic steroids: Secondary | ICD-10-CM

## 2017-08-19 DIAGNOSIS — Z1239 Encounter for other screening for malignant neoplasm of breast: Secondary | ICD-10-CM

## 2017-08-19 DIAGNOSIS — Z6836 Body mass index (BMI) 36.0-36.9, adult: Secondary | ICD-10-CM | POA: Diagnosis not present

## 2017-08-19 DIAGNOSIS — M2011 Hallux valgus (acquired), right foot: Secondary | ICD-10-CM | POA: Diagnosis not present

## 2017-08-19 DIAGNOSIS — I10 Essential (primary) hypertension: Secondary | ICD-10-CM

## 2017-08-19 DIAGNOSIS — Z87891 Personal history of nicotine dependence: Secondary | ICD-10-CM | POA: Diagnosis not present

## 2017-08-19 DIAGNOSIS — Z79899 Other long term (current) drug therapy: Secondary | ICD-10-CM | POA: Diagnosis not present

## 2017-08-19 DIAGNOSIS — Z940 Kidney transplant status: Secondary | ICD-10-CM | POA: Diagnosis not present

## 2017-08-19 DIAGNOSIS — Z794 Long term (current) use of insulin: Secondary | ICD-10-CM

## 2017-08-19 DIAGNOSIS — E785 Hyperlipidemia, unspecified: Secondary | ICD-10-CM | POA: Diagnosis not present

## 2017-08-19 DIAGNOSIS — E119 Type 2 diabetes mellitus without complications: Secondary | ICD-10-CM | POA: Diagnosis not present

## 2017-08-19 DIAGNOSIS — Z713 Dietary counseling and surveillance: Secondary | ICD-10-CM

## 2017-08-19 DIAGNOSIS — E1169 Type 2 diabetes mellitus with other specified complication: Secondary | ICD-10-CM

## 2017-08-19 DIAGNOSIS — E1143 Type 2 diabetes mellitus with diabetic autonomic (poly)neuropathy: Secondary | ICD-10-CM

## 2017-08-19 DIAGNOSIS — Z0000 Encounter for general adult medical examination without abnormal findings: Secondary | ICD-10-CM

## 2017-08-19 LAB — POCT GLYCOSYLATED HEMOGLOBIN (HGB A1C): HEMOGLOBIN A1C: 6.7

## 2017-08-19 LAB — GLUCOSE, CAPILLARY: GLUCOSE-CAPILLARY: 108 mg/dL — AB (ref 65–99)

## 2017-08-19 MED ORDER — TACROLIMUS 1 MG PO CAPS: 5.0000 mg | ORAL_CAPSULE | Freq: Two times a day (BID) | ORAL | 11 refills | Status: DC

## 2017-08-19 MED ORDER — VALACYCLOVIR HCL 1 G PO TABS: 1000.0000 mg | ORAL_TABLET | Freq: Every day | ORAL | 2 refills | Status: DC

## 2017-08-19 NOTE — Progress Notes (Signed)
   Assessment and Plan:  See Encounters tab for problem-based medical decision making.   __________________________________________________________  HPI:   72 year old woman here for follow-up of diabetes.  This is my first time meeting the patient's.  She is switching over to my panel because morning appointments work much better for her schedule.  She reports good compliance with her medications and denies any adverse side effects.  She says she has been checking her blood sugars very infrequently at home.  Uses short acting insulin with her breakfast, and good compliance with long-acting insulin in the evening.  Denies any hypoglycemic symptoms.  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  She is functional all her activities of daily living.  She manages all her own medications.  Denies any recent fevers or chills.  She does endorse that she started to get a new herpes outbreak on her backside and started taking Valtrex yesterday which usually controls it well.  Denies any chest pain or pressure.  No recent hospitalizations in the last 2 years, no recent emergency department visits.  __________________________________________________________  Problem List: Patient Active Problem List   Diagnosis Date Noted  . Hyperlipidemia associated with type 2 diabetes mellitus ([place]) 07/30/2011    Priority: High  . AAA (abdominal aortic aneurysm) without rupture ([place]) 07/09/2011    Priority: High  . 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 Hypertension 06/01/2014    Priority: Medium  . Diabetic neuropathy ([place]) 05/08/2013    Priority: Medium  . History of kidney transplant 05/08/2013    Priority: Medium  . 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  Lipodermatosclerosis 05/10/2015    Priority: Low  . Health care maintenance 06/08/2014    Priority: Low  . Hypothyroidism 06/01/2014    Priority: Low  . Hepatic steatosis 08/06/2013    Priority: Low    Medications: Reconciled today in Epic __________________________________________________________  Physical Exam:  Vital Signs: Vitals:   08/19/17 0916  BP: 135/75  Pulse: 88  Temp: 98 F (36.7 C)  TempSrc: Oral  SpO2: 98%  Weight: 230 lb 3.2 oz (104.4 kg)    Gen: Well appearing, NAD ENT: OP clear without erythema or exudate.  Neck: No cervical LAD, No thyromegaly or nodules. CV: RRR, no murmurs Pulm: Normal effort, CTA throughout, no wheezing Ext: Warm, no edema, normal joints.  On her right foot the second toe overlies the great toe and there is a moderate hallux valgus deformity. Skin: No atypical appearing moles. No rashes.  Dry skin

## 2017-08-19 NOTE — Assessment & Plan Note (Signed)
Hemoglobin A1c 6.7% which is at goal.  I reviewed her home glucose logs which show very limited fingerstick readings.  Current regimen is short acting insulin 6 units which she only uses once daily and long-acting insulin 35 units in the evening.  I advised that the short acting insulin is probably not adding much to her care, can discontinue.  Continue with only long-acting insulin right now.  Foot exam fine today.

## 2017-08-19 NOTE — Assessment & Plan Note (Signed)
Last cholesterol checked November 2018 and is in care everywhere: Total cholesterol 290, HDL 58, LDL 199.  Has never had an ischemic vascular events.  I advised her I think she would benefit from restarting statin for primary prevention of ischemic vascular disease.  Patient declined, wants to try lifestyle interventions first, she felt like she was having side effects on pravastatin in the past though does not remember what they are.  I advised we should talk about this again in the future.

## 2017-08-19 NOTE — Progress Notes (Signed)
  Medical Nutrition Therapy:  Appt start time: 0915 end time:  1000.  Assessment:  Primary concerns today: weight loss and information to help her son cook healthier for her. This is a follow up from 2015 for Ms. Zenk who has had diabetes for a while and is also s/p renal transplant. She is interested in knowing what to eat and how she can prevent weight gain. Her son assists her with cooking and shopping.  Preferred Learning Style: No preference indicated  Learning Readiness: Ready, Change in progress MEDICATIONS: prednisone, magnesium, prograf, vitamin D in MVI and suppllllement BLOOD SUGARS: not reviewed today DIETARY INTAKE: Usual eating pattern includes 3 meals and 1-3 snacks per day. Everyday foods include eggs, grits, bacon, bread, fruit and vegetables.  Avoided foods include dark and regular soda.   24-hr recall:  B ( AM): used to skip breakfast but has been eating since her last hospitalization: 1 egg, 1 toast, 2 slices bacon, coffee with milk, sometimes grits  L ( PM): biggest meal- usually meat, vegetables, starch Beverages: water, sweet tea, diet coke  Usual physical activity: not discussed today   Progress Towards Goal(s):  In progress.   Nutritional Diagnosis:  NB-1.1 Food and nutrition-related knowledge deficit As related to lack of prior exposure to diabetes meal planning.  As evidenced by her report and questions.    Intervention:  Nutrition education about what foods to eat and began general discussion of portion sizes.  Teaching Method Utilized: Visual using picture and large print, Auditory Handouts given during visit include: What to eat from learning about diabetes Barriers to learning/adherence to lifestyle change: competing values Demonstrated degree of understanding via:  Teach Back   Monitoring/Evaluation:  Dietary intake, exercise, meter, and body weight in 3 week(s). Debera Lat, RD 08/19/2017 5:11 PM.

## 2017-08-19 NOTE — Patient Instructions (Addendum)
The daily maintenance dose of vitamin D varies by age, but most children and adults generally require [phone] IU of vitamin D daily.  Try to do meatless one day a week- "Meatless Mondays"   Be sure to take your Novolog 15 minutes before your meal.   Try to be sure your grocery cart and refrigerator and your cabinets look like you want your plate to look.   Try to fill half your plate with vegetables at lunch and dinner, 1/4 of your plate with protein and 1/4 with a minimally processed grain like quinoa or barley or brown rice.   Whole grains take longer to cook so consider cooking a larger amount so it lasts several meals

## 2017-08-19 NOTE — Telephone Encounter (Signed)
[name] is unable to afford her test strips to check her blood sugar. Told they are 49$. Pharmacy 'fixed' it and now they are $9.98 for 300 strips. She needs to let them know because they need to order the strips and won't have them until tomorrow afternoon.

## 2017-08-19 NOTE — Assessment & Plan Note (Signed)
Patient with a history of end-stage renal disease around 2004-10-06 and then received a deceased donor renal transplant in 06-Oct-2008.  She follows with Dr. Gwynneth Macleod at [facility] neph[facility], last labs were November 2018 with a creatinine of 1.45 and estimated GFR of 43.  We reviewed her antirejection medications and will continue with tacrolimus 5 mg twice daily, prednisone 5 mg daily, and mycophenolate 500 mg twice daily.  She also uses Bactrim 1 single strength 3 times weekly for PJP prophylaxis.

## 2017-08-19 NOTE — Assessment & Plan Note (Signed)
Borderline hypertension with occasional values over 150 but most of them under 864 systolic.  Not requiring any significant antihypertensive medications, currently only on furosemide 40 mg daily for lower extremity edema, which I think is fine to continue.  We will continue to monitor her blood pressure at each visit.

## 2017-08-21 DIAGNOSIS — I8310 Varicose veins of unspecified lower extremity with inflammation: Secondary | ICD-10-CM | POA: Diagnosis not present

## 2017-08-21 DIAGNOSIS — K922 Gastrointestinal hemorrhage, unspecified: Secondary | ICD-10-CM | POA: Diagnosis not present

## 2017-08-21 DIAGNOSIS — Z792 Long term (current) use of antibiotics: Secondary | ICD-10-CM | POA: Diagnosis not present

## 2017-08-21 DIAGNOSIS — Z8719 Personal history of other diseases of the digestive system: Secondary | ICD-10-CM | POA: Diagnosis not present

## 2017-08-21 DIAGNOSIS — Z7952 Long term (current) use of systemic steroids: Secondary | ICD-10-CM | POA: Diagnosis not present

## 2017-08-21 DIAGNOSIS — Z8639 Personal history of other endocrine, nutritional and metabolic disease: Secondary | ICD-10-CM | POA: Diagnosis not present

## 2017-08-21 DIAGNOSIS — Z7901 Long term (current) use of anticoagulants: Secondary | ICD-10-CM | POA: Diagnosis not present

## 2017-08-21 DIAGNOSIS — D649 Anemia, unspecified: Secondary | ICD-10-CM | POA: Diagnosis not present

## 2017-08-21 DIAGNOSIS — E119 Type 2 diabetes mellitus without complications: Secondary | ICD-10-CM | POA: Diagnosis not present

## 2017-08-21 DIAGNOSIS — N281 Cyst of kidney, acquired: Secondary | ICD-10-CM | POA: Diagnosis not present

## 2017-08-21 DIAGNOSIS — I714 Abdominal aortic aneurysm, without rupture: Secondary | ICD-10-CM | POA: Diagnosis not present

## 2017-08-21 DIAGNOSIS — Z4822 Encounter for aftercare following kidney transplant: Secondary | ICD-10-CM | POA: Diagnosis not present

## 2017-08-21 DIAGNOSIS — I10 Essential (primary) hypertension: Secondary | ICD-10-CM | POA: Diagnosis not present

## 2017-08-21 DIAGNOSIS — Z940 Kidney transplant status: Secondary | ICD-10-CM | POA: Diagnosis not present

## 2017-08-21 DIAGNOSIS — E876 Hypokalemia: Secondary | ICD-10-CM | POA: Diagnosis not present

## 2017-08-21 DIAGNOSIS — E039 Hypothyroidism, unspecified: Secondary | ICD-10-CM | POA: Diagnosis not present

## 2017-08-21 DIAGNOSIS — Z79899 Other long term (current) drug therapy: Secondary | ICD-10-CM | POA: Diagnosis not present

## 2017-08-28 ENCOUNTER — Other Ambulatory Visit: Payer: Self-pay

## 2017-08-28 DIAGNOSIS — E1143 Type 2 diabetes mellitus with diabetic autonomic (poly)neuropathy: Secondary | ICD-10-CM

## 2017-08-28 MED ORDER — INSULIN GLARGINE 100 UNIT/ML SOLOSTAR PEN: PEN_INJECTOR | SUBCUTANEOUS | 5 refills | Status: DC

## 2017-08-28 NOTE — Telephone Encounter (Signed)
Insulin Glargine (LANTUS SOLOSTAR) 100 UNIT/ML Solostar Pen   Refill request @ [facility] on [address]

## 2017-09-04 DIAGNOSIS — E113393 Type 2 diabetes mellitus with moderate nonproliferative diabetic retinopathy without macular edema, bilateral: Secondary | ICD-10-CM | POA: Diagnosis not present

## 2017-09-04 DIAGNOSIS — H353134 Nonexudative age-related macular degeneration, bilateral, advanced atrophic with subfoveal involvement: Secondary | ICD-10-CM | POA: Diagnosis not present

## 2017-09-04 DIAGNOSIS — H25812 Combined forms of age-related cataract, left eye: Secondary | ICD-10-CM | POA: Diagnosis not present

## 2017-09-04 DIAGNOSIS — H25811 Combined forms of age-related cataract, right eye: Secondary | ICD-10-CM | POA: Diagnosis not present

## 2017-09-09 ENCOUNTER — Ambulatory Visit: Payer: Medicare Other | Admitting: Dietician

## 2017-09-12 DIAGNOSIS — H25811 Combined forms of age-related cataract, right eye: Secondary | ICD-10-CM | POA: Diagnosis not present

## 2017-09-12 DIAGNOSIS — H2511 Age-related nuclear cataract, right eye: Secondary | ICD-10-CM | POA: Diagnosis not present

## 2017-09-12 DIAGNOSIS — Z01818 Encounter for other preprocedural examination: Secondary | ICD-10-CM | POA: Diagnosis not present

## 2017-09-16 ENCOUNTER — Other Ambulatory Visit: Payer: Self-pay | Admitting: Student in an Organized Health Care Education/Training Program

## 2017-09-16 DIAGNOSIS — E1143 Type 2 diabetes mellitus with diabetic autonomic (poly)neuropathy: Secondary | ICD-10-CM

## 2017-09-16 MED ORDER — INSULIN GLARGINE 100 UNIT/ML SOLOSTAR PEN: PEN_INJECTOR | SUBCUTANEOUS | 5 refills | Status: DC

## 2017-09-16 NOTE — Telephone Encounter (Signed)
Per Jeani Hawking at [facility] at [facility], need a DX code for patient diabetes

## 2017-09-16 NOTE — Telephone Encounter (Signed)
Lantus ordered 08/28/17, pls resend with dx code. Thank you!

## 2017-09-19 DIAGNOSIS — H353232 Exudative age-related macular degeneration, bilateral, with inactive choroidal neovascularization: Secondary | ICD-10-CM | POA: Diagnosis not present

## 2017-10-14 ENCOUNTER — Encounter: Payer: Medicare Other | Admitting: Student in an Organized Health Care Education/Training Program

## 2017-10-17 ENCOUNTER — Telehealth: Payer: Self-pay | Admitting: Student in an Organized Health Care Education/Training Program

## 2017-10-18 ENCOUNTER — Ambulatory Visit (INDEPENDENT_AMBULATORY_CARE_PROVIDER_SITE_OTHER): Payer: Medicare Other | Admitting: Internal Medicine

## 2017-10-18 ENCOUNTER — Telehealth: Payer: Self-pay | Admitting: Dietician

## 2017-10-18 ENCOUNTER — Ambulatory Visit (HOSPITAL_COMMUNITY)
Admission: RE | Admit: 2017-10-18 | Discharge: 2017-10-18 | Disposition: A | Discharge status: Home / Self Care | Payer: Medicare Other | Source: Ambulatory Visit | Attending: Student in an Organized Health Care Education/Training Program | Admitting: Student in an Organized Health Care Education/Training Program

## 2017-10-18 VITALS — BP 132/88 | HR 107 | Temp 98.2°F | Wt 229.5 lb

## 2017-10-18 DIAGNOSIS — I120 Hypertensive chronic kidney disease with stage 5 chronic kidney disease or end stage renal disease: Secondary | ICD-10-CM | POA: Diagnosis not present

## 2017-10-18 DIAGNOSIS — Q799 Congenital malformation of musculoskeletal system, unspecified: Secondary | ICD-10-CM | POA: Diagnosis not present

## 2017-10-18 DIAGNOSIS — Z87891 Personal history of nicotine dependence: Secondary | ICD-10-CM | POA: Diagnosis not present

## 2017-10-18 DIAGNOSIS — M1811 Unilateral primary osteoarthritis of first carpometacarpal joint, right hand: Secondary | ICD-10-CM | POA: Diagnosis not present

## 2017-10-18 DIAGNOSIS — M19041 Primary osteoarthritis, right hand: Secondary | ICD-10-CM | POA: Insufficient documentation

## 2017-10-18 DIAGNOSIS — M778 Other enthesopathies, not elsewhere classified: Secondary | ICD-10-CM

## 2017-10-18 DIAGNOSIS — N186 End stage renal disease: Secondary | ICD-10-CM

## 2017-10-18 DIAGNOSIS — M779 Enthesopathy, unspecified: Secondary | ICD-10-CM

## 2017-10-18 DIAGNOSIS — Z7952 Long term (current) use of systemic steroids: Secondary | ICD-10-CM | POA: Diagnosis not present

## 2017-10-18 DIAGNOSIS — E2839 Other primary ovarian failure: Secondary | ICD-10-CM | POA: Diagnosis not present

## 2017-10-18 DIAGNOSIS — M19031 Primary osteoarthritis, right wrist: Secondary | ICD-10-CM | POA: Diagnosis not present

## 2017-10-18 DIAGNOSIS — E039 Hypothyroidism, unspecified: Secondary | ICD-10-CM | POA: Diagnosis not present

## 2017-10-18 DIAGNOSIS — Z940 Kidney transplant status: Secondary | ICD-10-CM

## 2017-10-18 MED ORDER — PREDNISONE 10 MG PO TABS: 10.0000 mg | ORAL_TABLET | Freq: Every day | ORAL | 0 refills | Status: AC

## 2017-10-18 NOTE — Telephone Encounter (Signed)
Hello, Thank you so much for touching base with patient and keeping me informed.

## 2017-10-18 NOTE — Assessment & Plan Note (Addendum)
Assessment: Patient here with persistent pain and swelling of dorsal aspect of right hand since Jan. She was at the grocery store loading a case of bottled water onto the conveyor belt when it slipped off the counter. She was able to catch the case with her right hand and notes her hand did not hit the ground. She developed immediate pain with swelling of dorsal hand and digits 2, 3 and 4 developing over the following few days. Since then, she's had resolution finger swelling and improved swelling on back of hand, but still experiences pain. She has no prior history of similar episodes. Has been taking tylenol at home which helps somewhat. She's been doing "hand exercises" at home which have not helped. She is R-handed and is having difficulty not using it.   Exam shows edema of dorsal hand over the 2nd, 3rd and 4th metacarpals. There is also boggy edema overlying 3rd MCP joint as well as TTP. No pinpoint tenderness of carpals or metacarpals. She has intact ROM, strength and sensation.   Plan: Suspect extensor tenosynovitis related to trauma. That being said, she is post-menopausal on chronic steroids without prior DEXA in chart and could have a fragility fracture. Will order plain films to r/o fracture and treat with steroids as patient unable to take NSAIDs due to renal transplant. She was also advised to rest hand as much as she's able, and not to do hand exercises. -Follow-up plain films -Prednisone 10mg  daily x 5 days, to resume 5mg  daily after course -Ordering DEXA

## 2017-10-18 NOTE — Patient Instructions (Signed)
It was great meeting you today! I'm sorry you are still having hand pain.   I think you have inflammation of your tendons in the back of your hand. This is called "tendonitis" and is treated with anti-inflammatory medicines. Unfortunately, we cannot use NSAIDs due to the kidney transplant, so we will treat you with a briefly increased dose of steroids.   I have sent a prescription to your pharmacy for Prednisone 10mg  daily for 5 days. DO NOT take your other Prednisone 5mg  prescription while you are on this regimen.   I have also ordered x-rays of your hand to make sure there is no underlying fracture.

## 2017-10-18 NOTE — Telephone Encounter (Signed)
Steroid-Induced Hyperglycemia Prevention and Management [name] is a 72 y.o. female who meets criteria for [facility] glucose monitoring program (diabetes patient prescribed short course of steroids).  Called Ms. Wooton and she is not planning on taking the prednisone until next week  Because it costs 10$ and she cannot buy it until next week. She also mentioned that she'd wait to get results of her other tests.  I advised her to check her blood sugar two times a day if she does take it and to call us if her blood sugars are > 200 mg/dl.    Follow up next week to see if she is taking steroids.     Butch Penny Plyler 4:52 PM 10/18/2017

## 2017-10-18 NOTE — Assessment & Plan Note (Signed)
Assessment: Patient post-menopausal and on chronic prednisone due to history of renal transplant. I could not find evidence for prior DEXA in chart but she is at risk for osteoporosis and associated fragility fractures.   Plan: Have ordered DEXA scan for evaluation as she would benefit from treatment to reduce future fracture risk.

## 2017-10-18 NOTE — Progress Notes (Signed)
   CC: Right hand pain, swelling  HPI:  [name] is a 72 y.o. F with history of ESRD with renal transplant on chronic immunosuppression, HTN and hypothyroidism and who presents today for evaluation of persistent pain and swelling of her R-hand since January.   For details regarding today's visit and the status of their chronic medical issues, please refer to the assessment and plan.  Past Medical History:  Diagnosis Date  . AAA (abdominal aortic aneurysm) (HCC)    3.8 cm Infrarenal managed at [facility]  . Clotting disorder ([place])    PE, IVC filter placed at [facility]  . Diabetes mellitus   . Hypertension   . Hypothyroidism   . Morbid obesity (HCC)    BMI 34  . Renal transplant recipient    [facility]   Review of Systems:   General: Denies fevers, chills, weight loss Cardiac: Denies CP, SOB Abd: Denies abdominal pain, changes in bowels Extremities: Admits to hand pain and swelling. Admits to subjective weakness. Denies numbness or tingling.   Physical Exam: General: Alert, in no acute distress. Pleasant and conversant HEENT: Normocephalic, atraumatic.No scleral icterus. No hoarseness or dysarthria  Cardiac: RRR, no murmur Pulmonary: Normal WOB on RA. Able to speak in complete sentences Extremities: Warm, perfused. Right hand: Inspection with appreciable edema overlying the 2nd, 3rd and 4th metacarpals with boggy edema over 3rd MCP joint. AROM intact but slow due to pain. Admits to TTP, but no focal area of exquisite tenderness. No bruising, erythema or wound.   Vitals:   10/18/17 1322  BP: 132/88  Pulse: (!) 107  Temp: 98.2 F (36.8 C)  TempSrc: Oral  SpO2: 99%  Weight: 229 lb 8 oz (104.1 kg)   Body mass index is 35.94 kg/m.  Assessment & Plan:   See Encounters Tab for problem based charting.  Patient seen with Dr. Eppie Gibson

## 2017-10-21 NOTE — Progress Notes (Signed)
I saw and evaluated the patient. I personally confirmed the key portions of Dr. Rober Minion history and exam and reviewed pertinent patient test results. The assessment, diagnosis, and plan were formulated together and I agree with the documentation in the resident's note.  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

## 2017-11-14 ENCOUNTER — Encounter: Payer: Self-pay | Admitting: Student in an Organized Health Care Education/Training Program

## 2017-11-26 ENCOUNTER — Inpatient Hospital Stay: Admission: RE | Admit: 2017-11-26 | Payer: Medicare Other | Source: Ambulatory Visit

## 2017-11-26 ENCOUNTER — Ambulatory Visit: Payer: Medicare Other

## 2018-01-07 NOTE — Addendum Note (Signed)
Addended by: Hulan Fray on: 01/07/2018 06:21 PM   Modules accepted: Orders

## 2018-01-25 DIAGNOSIS — E213 Hyperparathyroidism, unspecified: Secondary | ICD-10-CM | POA: Diagnosis not present

## 2018-01-25 DIAGNOSIS — Z940 Kidney transplant status: Secondary | ICD-10-CM | POA: Diagnosis not present

## 2018-01-25 DIAGNOSIS — N189 Chronic kidney disease, unspecified: Secondary | ICD-10-CM | POA: Diagnosis not present

## 2018-01-25 DIAGNOSIS — Z79899 Other long term (current) drug therapy: Secondary | ICD-10-CM | POA: Diagnosis not present

## 2018-02-08 DIAGNOSIS — N189 Chronic kidney disease, unspecified: Secondary | ICD-10-CM | POA: Diagnosis not present

## 2018-04-09 DIAGNOSIS — H2512 Age-related nuclear cataract, left eye: Secondary | ICD-10-CM | POA: Diagnosis not present

## 2018-04-09 DIAGNOSIS — Z01818 Encounter for other preprocedural examination: Secondary | ICD-10-CM | POA: Diagnosis not present

## 2018-04-09 DIAGNOSIS — H25812 Combined forms of age-related cataract, left eye: Secondary | ICD-10-CM | POA: Diagnosis not present

## 2018-04-14 ENCOUNTER — Other Ambulatory Visit: Payer: Self-pay

## 2018-04-14 ENCOUNTER — Ambulatory Visit (INDEPENDENT_AMBULATORY_CARE_PROVIDER_SITE_OTHER): Payer: Medicare Other | Admitting: Student in an Organized Health Care Education/Training Program

## 2018-04-14 ENCOUNTER — Ambulatory Visit: Payer: Medicare Other | Admitting: Pharmacist

## 2018-04-14 ENCOUNTER — Encounter: Payer: Self-pay | Admitting: Student in an Organized Health Care Education/Training Program

## 2018-04-14 ENCOUNTER — Other Ambulatory Visit: Payer: Self-pay | Admitting: Pharmacist

## 2018-04-14 VITALS — BP 125/79 | HR 79 | Temp 98.1°F | Wt 215.7 lb

## 2018-04-14 DIAGNOSIS — E039 Hypothyroidism, unspecified: Secondary | ICD-10-CM | POA: Diagnosis not present

## 2018-04-14 DIAGNOSIS — E1143 Type 2 diabetes mellitus with diabetic autonomic (poly)neuropathy: Secondary | ICD-10-CM

## 2018-04-14 DIAGNOSIS — M47812 Spondylosis without myelopathy or radiculopathy, cervical region: Secondary | ICD-10-CM

## 2018-04-14 DIAGNOSIS — Z87891 Personal history of nicotine dependence: Secondary | ICD-10-CM | POA: Diagnosis not present

## 2018-04-14 DIAGNOSIS — G8929 Other chronic pain: Secondary | ICD-10-CM

## 2018-04-14 DIAGNOSIS — E663 Overweight: Secondary | ICD-10-CM

## 2018-04-14 DIAGNOSIS — I10 Essential (primary) hypertension: Secondary | ICD-10-CM | POA: Diagnosis not present

## 2018-04-14 DIAGNOSIS — Z79899 Other long term (current) drug therapy: Secondary | ICD-10-CM

## 2018-04-14 DIAGNOSIS — Z794 Long term (current) use of insulin: Secondary | ICD-10-CM

## 2018-04-14 DIAGNOSIS — Z23 Encounter for immunization: Secondary | ICD-10-CM

## 2018-04-14 DIAGNOSIS — Z6833 Body mass index (BMI) 33.0-33.9, adult: Secondary | ICD-10-CM | POA: Diagnosis not present

## 2018-04-14 DIAGNOSIS — Z940 Kidney transplant status: Secondary | ICD-10-CM | POA: Diagnosis not present

## 2018-04-14 LAB — POCT GLYCOSYLATED HEMOGLOBIN (HGB A1C): Hemoglobin A1C: 6.9 % — AB (ref 4.0–5.6)

## 2018-04-14 LAB — GLUCOSE, CAPILLARY: GLUCOSE-CAPILLARY: 72 mg/dL (ref 70–99)

## 2018-04-14 MED ORDER — DICLOFENAC SODIUM 1 % TD GEL: 2.0000 g | Freq: Two times a day (BID) | TRANSDERMAL | 1 refills | Status: DC

## 2018-04-14 NOTE — Assessment & Plan Note (Signed)
Acute complaints, but seems to be a chronic problem for the last 5 months.  No red flag symptoms.  Good strength in her hands with no weakness.  Normal reflexes.  No rashes.  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  Currently not function limiting.  We talked about natural progression, reasonable expectations.  I advised to use Voltaren topical gel twice daily as needed for the pain, Tylenol as well.  No NSAIDs because of renal transplants.  No need for imaging right now given normal exam and no red flags, but we can pursue MRI in the future if pain worsens to become more function limiting.

## 2018-04-14 NOTE — Assessment & Plan Note (Signed)
Symptomatically stable.  Plan to check TSH today.

## 2018-04-14 NOTE — Assessment & Plan Note (Signed)
Blood pressure initially elevated when walking into the clinic, but recheck at the end of visit was 125/69.  Plan is to continue to monitor her blood pressure, currently not requiring antihypertensives.  She is overweight, and she may need antihypertensives in the future.  Will monitor at each visit.

## 2018-04-14 NOTE — Patient Instructions (Signed)
I think your neck pain is coming from arthritis of your spine.  This is a chronic problem likely will have good days and bad days.  For now on want you to use the Voltaren gel on your neck twice a day as needed for the pain.  I also referred you to physical therapy to work on strengthening and range of motion exercises.

## 2018-04-14 NOTE — Progress Notes (Signed)
   Assessment and Plan:  See Encounters tab for problem-based medical decision making.   __________________________________________________________  HPI:   72 year old woman here for her follow-up of diabetes.  Has an acute complaint today of neck pain.  Has been going on since May.  She points to the back of her neck and to the right side of her trapezius muscle as the point of pain.  It is ups and downs.  Pain does not radiate down either arm.  She does report some weakness in her hands.  It to harder for her open pill bottles now but has been in the past.  No fevers or chills.  No personal history of cancer.  No night sweats.  No recent falls or other trauma.  Has not had neck pain like this before.  Reports that the pain is moderate and bothersome.  Does not currently limit her functional capacity.  She is still living.  Thinks that it may have been strained when lifting something heavy.   Reports good compliance with insulin, using Lantus 35 units once daily.  Denies any hypoglycemia.  Has not been back to see me since February.  __________________________________________________________  Problem List: Patient Active Problem List   Diagnosis Date Noted  . Hyperlipidemia associated with type 2 diabetes mellitus ([place]) 07/30/2011    Priority: High  . AAA (abdominal aortic aneurysm) without rupture ([place]) 07/09/2011    Priority: High  . 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 Hypertension 06/01/2014    Priority: Medium  . Diabetic neuropathy ([place]) 05/08/2013    Priority: Medium  . History of kidney transplant 05/08/2013    Priority: Medium  . Osteoarthritis cervical spine 04/14/2018    Priority: Low  . 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 Lipodermatosclerosis 05/10/2015    Priority: Low  . Health care maintenance 06/08/2014    Priority: Low  . Hypothyroidism 06/01/2014   Priority: Low  . Hepatic steatosis 08/06/2013    Priority: Low    Medications: Reconciled today in Epic __________________________________________________________  Physical Exam:  Vital Signs: Vitals:   04/14/18 1050  BP: (!) 146/73  Pulse: 98  Temp: 98.1 F (36.7 C)  TempSrc: Oral  SpO2: 98%  Weight: 215 lb 11.2 oz (97.8 kg)    Gen: Well appearing, NAD Neck: No cervical LAD, No thyromegaly or nodules, No JVD. Neuro: Normal strength in bilateral upper extremities, normal upper extremity reflexes, normal sensation in her hands. CV: RRR, no murmurs Pulm: Normal effort, CTA throughout, no wheezing Abd: Soft, NT, ND, no hernia appreciated Ext: Warm, no edema, normal joints

## 2018-04-14 NOTE — Assessment & Plan Note (Signed)
Historically well controlled.  Will check hemoglobin A1c today.  Continue with Lantus 35 units daily.  We stopped her small dose of mealtime insulin at her last visit.  Advised that I want to see her more frequently, at least every 3 months to monitor her hemoglobin A1c.

## 2018-04-15 ENCOUNTER — Encounter: Payer: Self-pay | Admitting: Student in an Organized Health Care Education/Training Program

## 2018-04-15 ENCOUNTER — Telehealth: Payer: Self-pay | Admitting: Dietician

## 2018-04-15 DIAGNOSIS — E1143 Type 2 diabetes mellitus with diabetic autonomic (poly)neuropathy: Secondary | ICD-10-CM

## 2018-04-15 DIAGNOSIS — Z794 Long term (current) use of insulin: Principal | ICD-10-CM

## 2018-04-15 LAB — TSH: TSH: 0.927 u[IU]/mL (ref 0.450–4.500)

## 2018-04-15 LAB — BMP8+ANION GAP
Anion Gap: 18 mmol/L (ref 10.0–18.0)
BUN / CREAT RATIO: 22 (ref 12–28)
BUN: 34 mg/dL — ABNORMAL HIGH (ref 8–27)
CALCIUM: 10 mg/dL (ref 8.7–10.3)
CHLORIDE: 101 mmol/L (ref 96–106)
CO2: 20 mmol/L (ref 20–29)
Creatinine, Ser: 1.52 mg/dL — ABNORMAL HIGH (ref 0.57–1.00)
GFR calc Af Amer: 40 mL/min/{1.73_m2} — ABNORMAL LOW (ref >59)
GFR calc non Af Amer: 34 mL/min/{1.73_m2} — ABNORMAL LOW (ref >59)
GLUCOSE: 78 mg/dL (ref 65–99)
POTASSIUM: 4.4 mmol/L (ref 3.5–5.2)
SODIUM: 139 mmol/L (ref 134–144)

## 2018-04-15 MED ORDER — ACCU-CHEK SOFTCLIX LANCETS MISC: 12 refills | Status: DC

## 2018-04-15 MED ORDER — GLUCOSE BLOOD VI STRP: ORAL_STRIP | 11 refills | Status: DC

## 2018-04-15 NOTE — Telephone Encounter (Signed)
Helped her reset her meter. Her test strips were outdated, but she used them anyway to check her blood sugar. The result was: 73 mg/dl. She asked for new testing supply prescriptions.  I suggested she buy new batteries for her meter.   She asked about the keto diet for weight loss. We talked about eating 3 healthy meals a day with smaller portions for weight loss. I invited her to the diabetes group this month as we'll be working on meal plans. She agreed to have me mail her a flyer.

## 2018-04-24 ENCOUNTER — Ambulatory Visit: Payer: Medicare Other

## 2018-04-28 DIAGNOSIS — H25812 Combined forms of age-related cataract, left eye: Secondary | ICD-10-CM | POA: Diagnosis not present

## 2018-04-28 DIAGNOSIS — H2512 Age-related nuclear cataract, left eye: Secondary | ICD-10-CM | POA: Diagnosis not present

## 2018-04-29 ENCOUNTER — Other Ambulatory Visit: Payer: Self-pay

## 2018-04-29 ENCOUNTER — Ambulatory Visit
Payer: Medicare Other | Attending: Student in an Organized Health Care Education/Training Program | Admitting: Physical Therapy

## 2018-04-29 ENCOUNTER — Encounter: Payer: Self-pay | Admitting: Physical Therapy

## 2018-04-29 DIAGNOSIS — M6281 Muscle weakness (generalized): Secondary | ICD-10-CM | POA: Insufficient documentation

## 2018-04-29 DIAGNOSIS — M542 Cervicalgia: Secondary | ICD-10-CM | POA: Diagnosis not present

## 2018-04-29 DIAGNOSIS — R29898 Other symptoms and signs involving the musculoskeletal system: Secondary | ICD-10-CM | POA: Diagnosis not present

## 2018-04-29 NOTE — Therapy (Addendum)
[place], [place], [address] Phone: [phone]   Fax:  [phone]  Physical Therapy Evaluation  Patient Details  Name: [name] MRN: [id number] Date of Birth: 1946-06-10 Referring Provider (PT): Dr Lalla Brothers   Encounter Date: 04/29/2018  PT End of Session - 04/29/18 0934    Visit Number  1    Number of Visits  8    Date for PT Re-Evaluation  05/27/18    PT Start Time  0934    PT Stop Time  1029    PT Time Calculation (min)  55 min       Past Medical History:  Diagnosis Date  . AAA (abdominal aortic aneurysm) (HCC)    3.8 cm Infrarenal managed at [facility]  . Clotting disorder ([place])    PE, IVC filter placed at [facility]  . Diabetes mellitus   . Hypertension   . Hypothyroidism   . Morbid obesity (HCC)    BMI 34  . Renal transplant recipient    [facility]    Past Surgical History:  Procedure Laterality Date  . ABDOMINAL AORTIC ANEURYSM REPAIR    . KIDNEY TRANSPLANT      There were no vitals filed for this visit.   Subjective Assessment - 04/29/18 0937    Subjective  pt reports insidious onset of Rt sided arm pain in May, it has moved up into the neck. Started as a deep itch - tried deep pressure to release it.  Now she has more pain in the neck and upper shoulder Rt side. Is using a pain cream and has some relief.      Diagnostic tests  none    Patient Stated Goals  get it so her upper shoulders feel the same on both sides, get rid of the tightness and soreness.     Currently in Pain?  No/denies    Pain Descriptors / Indicators  Tightness   stiffness   Aggravating Factors   lying down to rest, putting pressure into the muscle     Pain Relieving Factors  tylenol and muscle cream         [facility] PT Assessment - 04/29/18 0001      Assessment   Medical Diagnosis  cervical spondylosis    Referring Provider (PT)  Dr Lalla Brothers    Onset Date/Surgical Date  10/27/17    Hand Dominance  Right    Next MD Visit  3 months    Prior Therapy  not for neck      Precautions   Precautions  None      Balance Screen   Has the patient fallen in the past 6 months  Yes    How many times?  1   yesterday - moved to a townhouse has stairs now, missed last   Has the patient had a decrease in activity level because of a fear of falling?   No    Is the patient reluctant to leave their home because of a fear of falling?   No      Prior Function   Level of Independence  Independent    Vocation  Retired;On disability    Leisure  watch TV      Observation/Other Assessments   Focus on Therapeutic Outcomes (FOTO)   39% limited      Posture/Postural Control   Posture/Postural Control  Postural limitations    Postural Limitations  Forward head;Rounded Shoulders;Increased thoracic kyphosis  ROM / Strength   AROM / PROM / Strength  AROM;Strength      AROM   AROM Assessment Site  Shoulder;Cervical    Right/Left Shoulder  --   WNL   Cervical Flexion  WNL pulling Rt upper shoulder    Cervical Extension  55    Cervical - Right Rotation  62 with tightness    Cervical - Left Rotation  70      Strength   Overall Strength Comments  mid back 4/5     Strength Assessment Site  Shoulder;Elbow    Right/Left Shoulder  --   grossly WNL   Right/Left Elbow  --   WNL     Palpation   Spinal mobility  NA at this visit - had cataract surgery yesterday in [place] eye.     Palpation comment  a lot of tightness in Rt upper trap/levator, slight into Rt cervical paraspinals.       Special Tests   Other special tests  (-) spurlings.                 Objective measurements completed on examination: See above findings.      [place] Adult PT Treatment/Exercise - 04/29/18 0001      Exercises   Exercises  --   HEP per handout, VC for form            PT Education - 04/29/18 1215    Education Details  HEP     Person(s) Educated  Patient    Methods   Explanation;Demonstration;Handout    Comprehension  Returned demonstration;Verbalized understanding          PT Long Term Goals - 04/29/18 1218      PT LONG TERM GOAL #1   Title  I with advanced HEP ( 05/27/18)     Time  4    Period  Weeks    Status  New    Target Date  05/27/18      PT LONG TERM GOAL #2   Title  improve Rt cervical rotation =/> 75 degrees to assist with looking over her shoulder ( 05/27/18)     Time  4    Period  Weeks    Status  New    Target Date  05/27/18      PT LONG TERM GOAL #3   Title  improve upper back strength =/> 5-/5 to support her body with IADLs ( 05/27/18)     Time  4    Period  Weeks    Status  New    Target Date  05/27/18      PT LONG TERM GOAL #4   Title  report =/> 75% reduction in feelings of tightness/pain in the Rt upper shoulder/neck ( 05/27/18)     Time  4    Period  Weeks    Status  New    Target Date  05/27/18      PT LONG TERM GOAL #5   Title  improve FOTO =/< 35% limited ( 05/27/18)     Time  4    Period  Weeks    Status  New    Target Date  05/27/18             Plan - 04/29/18 1215    Clinical Impression Statement  72 yo female with onset of Rt upper shoulder/neck pain.  She has slight tightness in cervical rotation, upper body weakness with some postural changes along with significant  tightness into the Rt upper trap and levator.  She would benefit from PT to help decrease tightness and restore PLOF.     Clinical Presentation  Stable    Clinical Decision Making  Low    Rehab Potential  Good    PT Frequency  2x / week    PT Duration  4 weeks    PT Treatment/Interventions  Spinal Manipulations;Dry needling;Neuromuscular re-education;Manual techniques;Moist Heat;Traction;Therapeutic activities;Patient/family education;Taping;Ultrasound;Cryotherapy;Electrical Stimulation;Therapeutic exercise    PT Next Visit Plan  manual work to Rt upper trap area, postural correction ex, modalities PRN    Consulted and Agree with  Plan of Care  Patient       Patient will benefit from skilled therapeutic intervention in order to improve the following deficits and impairments:  Pain, Postural dysfunction, Increased muscle spasms, Decreased range of motion, Decreased strength  Visit Diagnosis: Cervicalgia - Plan: PT plan of care cert/re-cert  Muscle weakness (generalized) - Plan: PT plan of care cert/re-cert  Other symptoms and signs involving the musculoskeletal system - Plan: PT plan of care cert/re-cert     Problem List Patient Active Problem List   Diagnosis Date Noted  . Osteoarthritis cervical spine 04/14/2018  . 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 Lipodermatosclerosis 05/10/2015  . Health care maintenance 06/08/2014  . Hypothyroidism 06/01/2014  . Hypertension 06/01/2014  . Hepatic steatosis 08/06/2013  . Diabetic neuropathy ([place]) 05/08/2013  . History of kidney transplant 05/08/2013  . Hyperlipidemia associated with type 2 diabetes mellitus ([place]) 07/30/2011  . AAA (abdominal aortic aneurysm) without rupture ([place]) 07/09/2011  . 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   Jeral Pinch, PT 06/11/18 1:43 PM

## 2018-05-02 DIAGNOSIS — I8311 Varicose veins of right lower extremity with inflammation: Secondary | ICD-10-CM | POA: Diagnosis not present

## 2018-05-02 DIAGNOSIS — L680 Hirsutism: Secondary | ICD-10-CM | POA: Diagnosis not present

## 2018-05-02 DIAGNOSIS — I8312 Varicose veins of left lower extremity with inflammation: Secondary | ICD-10-CM | POA: Diagnosis not present

## 2018-05-02 DIAGNOSIS — I8310 Varicose veins of unspecified lower extremity with inflammation: Secondary | ICD-10-CM | POA: Diagnosis not present

## 2018-05-02 DIAGNOSIS — Z940 Kidney transplant status: Secondary | ICD-10-CM | POA: Diagnosis not present

## 2018-05-02 DIAGNOSIS — L821 Other seborrheic keratosis: Secondary | ICD-10-CM | POA: Diagnosis not present

## 2018-05-02 DIAGNOSIS — Z79899 Other long term (current) drug therapy: Secondary | ICD-10-CM | POA: Diagnosis not present

## 2018-05-05 ENCOUNTER — Other Ambulatory Visit: Payer: Self-pay | Admitting: Internal Medicine

## 2018-05-08 ENCOUNTER — Other Ambulatory Visit: Payer: Self-pay | Admitting: Family Medicine

## 2018-05-08 ENCOUNTER — Other Ambulatory Visit: Payer: Self-pay | Admitting: Internal Medicine

## 2018-05-08 DIAGNOSIS — K219 Gastro-esophageal reflux disease without esophagitis: Secondary | ICD-10-CM

## 2018-05-09 ENCOUNTER — Encounter

## 2018-05-09 DIAGNOSIS — Z940 Kidney transplant status: Secondary | ICD-10-CM | POA: Diagnosis not present

## 2018-05-09 DIAGNOSIS — I714 Abdominal aortic aneurysm, without rupture: Secondary | ICD-10-CM | POA: Diagnosis not present

## 2018-05-09 DIAGNOSIS — I10 Essential (primary) hypertension: Secondary | ICD-10-CM | POA: Diagnosis not present

## 2018-05-09 DIAGNOSIS — Z4822 Encounter for aftercare following kidney transplant: Secondary | ICD-10-CM | POA: Diagnosis not present

## 2018-05-09 DIAGNOSIS — D649 Anemia, unspecified: Secondary | ICD-10-CM | POA: Diagnosis not present

## 2018-05-09 DIAGNOSIS — Z794 Long term (current) use of insulin: Secondary | ICD-10-CM | POA: Diagnosis not present

## 2018-05-09 DIAGNOSIS — E119 Type 2 diabetes mellitus without complications: Secondary | ICD-10-CM | POA: Diagnosis not present

## 2018-05-09 DIAGNOSIS — Z7952 Long term (current) use of systemic steroids: Secondary | ICD-10-CM | POA: Diagnosis not present

## 2018-05-09 DIAGNOSIS — Z79899 Other long term (current) drug therapy: Secondary | ICD-10-CM | POA: Diagnosis not present

## 2018-05-09 DIAGNOSIS — D8989 Other specified disorders involving the immune mechanism, not elsewhere classified: Secondary | ICD-10-CM | POA: Diagnosis not present

## 2018-05-13 NOTE — Addendum Note (Signed)
Addended by: Hulan Fray on: 05/13/2018 06:07 PM   Modules accepted: Orders

## 2018-05-27 ENCOUNTER — Ambulatory Visit: Payer: Medicare Other | Admitting: Physical Therapy

## 2018-05-31 ENCOUNTER — Other Ambulatory Visit: Payer: Self-pay | Admitting: Family Medicine

## 2018-05-31 ENCOUNTER — Other Ambulatory Visit: Payer: Self-pay | Admitting: Internal Medicine

## 2018-05-31 DIAGNOSIS — K219 Gastro-esophageal reflux disease without esophagitis: Secondary | ICD-10-CM

## 2018-06-12 ENCOUNTER — Other Ambulatory Visit: Payer: Self-pay | Admitting: Internal Medicine

## 2018-07-16 DIAGNOSIS — Z4822 Encounter for aftercare following kidney transplant: Secondary | ICD-10-CM | POA: Diagnosis not present

## 2018-07-16 DIAGNOSIS — Z79899 Other long term (current) drug therapy: Secondary | ICD-10-CM | POA: Diagnosis not present

## 2018-07-17 ENCOUNTER — Other Ambulatory Visit: Payer: Self-pay | Admitting: Student in an Organized Health Care Education/Training Program

## 2018-07-17 DIAGNOSIS — K219 Gastro-esophageal reflux disease without esophagitis: Secondary | ICD-10-CM

## 2018-07-17 MED ORDER — PANTOPRAZOLE SODIUM 40 MG PO TBEC: 40.0000 mg | DELAYED_RELEASE_TABLET | Freq: Every day | ORAL | 3 refills | Status: DC

## 2018-07-17 NOTE — Telephone Encounter (Signed)
Needs refill on pantoprazole (PROTONIX) 40 MG tablet at [place], [place] AT [place] ; pt contact [phone]

## 2018-07-18 DIAGNOSIS — Z95828 Presence of other vascular implants and grafts: Secondary | ICD-10-CM | POA: Diagnosis not present

## 2018-07-18 DIAGNOSIS — I714 Abdominal aortic aneurysm, without rupture: Secondary | ICD-10-CM | POA: Diagnosis not present

## 2018-07-18 DIAGNOSIS — I723 Aneurysm of iliac artery: Secondary | ICD-10-CM | POA: Diagnosis not present

## 2018-07-18 DIAGNOSIS — Z9889 Other specified postprocedural states: Secondary | ICD-10-CM | POA: Diagnosis not present

## 2018-07-21 ENCOUNTER — Encounter: Payer: Self-pay | Admitting: *Deleted

## 2018-07-22 ENCOUNTER — Telehealth: Payer: Self-pay

## 2018-07-22 NOTE — Telephone Encounter (Signed)
90 with 3 refills sent to [facility] on 06/03/2018. Call placed to pharmacy. They have this rx and will get ready for patient. Pharmacy will notify patient when ready. Hubbard Hartshorn, RN, BSN

## 2018-07-22 NOTE — Telephone Encounter (Signed)
furosemide (LASIX) 40 MG tablet, refill request @  [place] [phone] - [place], [place] AT [address] [phone] (Phone) [phone] (Fax)

## 2018-07-24 DIAGNOSIS — H02839 Dermatochalasis of unspecified eye, unspecified eyelid: Secondary | ICD-10-CM | POA: Diagnosis not present

## 2018-07-24 DIAGNOSIS — H353134 Nonexudative age-related macular degeneration, bilateral, advanced atrophic with subfoveal involvement: Secondary | ICD-10-CM | POA: Diagnosis not present

## 2018-07-28 ENCOUNTER — Ambulatory Visit
Payer: Medicare Other | Attending: Student in an Organized Health Care Education/Training Program | Admitting: Physical Therapy

## 2018-07-28 ENCOUNTER — Encounter: Payer: Self-pay | Admitting: Physical Therapy

## 2018-07-28 DIAGNOSIS — M542 Cervicalgia: Secondary | ICD-10-CM | POA: Insufficient documentation

## 2018-07-28 DIAGNOSIS — M6281 Muscle weakness (generalized): Secondary | ICD-10-CM | POA: Diagnosis not present

## 2018-07-28 DIAGNOSIS — R29898 Other symptoms and signs involving the musculoskeletal system: Secondary | ICD-10-CM | POA: Diagnosis not present

## 2018-07-28 NOTE — Therapy (Signed)
[place], [place], [address] Phone: [phone]   Fax:  [phone]  Physical Therapy Evaluation  Patient Details  Name: [name] MRN: [id number] Date of Birth: 1945-11-29 Referring Provider (PT): Dr Lalla Brothers   Encounter Date: 07/28/2018  PT End of Session - 07/28/18 1007    Visit Number  1    Number of Visits  12    Date for PT Re-Evaluation  09/08/18    Authorization Type  10th visit progress note     PT Start Time  0927    PT Stop Time  1019    PT Time Calculation (min)  52 min    Activity Tolerance  Patient tolerated treatment well       Past Medical History:  Diagnosis Date  . AAA (abdominal aortic aneurysm) (HCC)    3.8 cm Infrarenal managed at [facility]  . Clotting disorder ([place])    PE, IVC filter placed at [facility]  . Diabetes mellitus   . Hypertension   . Hypothyroidism   . Morbid obesity (HCC)    BMI 34  . Renal transplant recipient    [facility]    Past Surgical History:  Procedure Laterality Date  . ABDOMINAL AORTIC ANEURYSM REPAIR    . KIDNEY TRANSPLANT      There were no vitals filed for this visit.   Subjective Assessment - 07/28/18 0934    Subjective  Pt went out of town after her last visit here to see family and came back after the New Year.  She has also been under medical care for an aneurysm - having second opinion on this mid Februeary.  she has been working on cervical retraction at home    Pertinent History  kidney transplant    Diagnostic tests  none    Patient Stated Goals  get it so her upper shoulders feel the same on both sides, get rid of the tightness and soreness. play voilin     Currently in Pain?  No/denies   worse with lying down and turning head,  gentle motion helps        [facility] PT Assessment - 07/28/18 0001      Assessment   Medical Diagnosis  cervical spondylosis    Referring Provider (PT)  Dr Lalla Brothers    Onset  Date/Surgical Date  10/27/17    Hand Dominance  Right    Next MD Visit  2 months    Prior Therapy  yes      Precautions   Precautions  None      Balance Screen   Has the patient fallen in the past 6 months  Yes    How many times?  1   as previous   Has the patient had a decrease in activity level because of a fear of falling?   No    Is the patient reluctant to leave their home because of a fear of falling?   No      Prior Function   Level of Independence  Independent    Vocation  Retired;On disability    Leisure  watch TV      Cognition   Overall Cognitive Status  Within Functional Limits for tasks assessed      Observation/Other Assessments   Focus on Therapeutic Outcomes (FOTO)   39% limited      Posture/Postural Control   Posture/Postural Control  Postural limitations    Postural Limitations  Forward  head;Rounded Shoulders;Increased thoracic kyphosis      ROM / Strength   AROM / PROM / Strength  AROM;Strength      AROM   AROM Assessment Site  Shoulder;Cervical    Right/Left Shoulder  --   bilat WNL   Cervical Flexion  20 pain Rt upper shoulder    Cervical Extension  WNL    Cervical - Right Rotation  52    Cervical - Left Rotation  67      Strength   Overall Strength Comments  mid back 4-/5 with some pain    Right/Left Shoulder  --   WNL   Right/Left Elbow  --   WNL     Palpation   Spinal mobility  hypomobile through c-spine     Palpation comment  tighte with large trigger point in Rt upper trap and levator, tightness in Rt SCM      Special Tests   Other special tests  (-) spurlings.                 Objective measurements completed on examination: See above findings.      [place] Adult PT Treatment/Exercise - 07/28/18 0001      Exercises   Exercises  Neck      Neck Exercises: Seated   Neck Retraction  10 reps   reviewed form for tucking chin   Shoulder Rolls  Backwards;10 reps    Other Seated Exercise  scap retraction x 10        Modalities   Modalities  Moist Heat      Moist Heat Therapy   Number Minutes Moist Heat  15 Minutes    Moist Heat Location  Cervical;Shoulder   Rt side seated     Neck Exercises: Stretches   Upper Trapezius Stretch  Left;Right;30 seconds    Other Neck Stretches  low and mid doorway stretch             PT Education - 07/28/18 1002    Education Details  previous HEP and new exercise.     Person(s) Educated  Patient    Methods  Explanation;Demonstration;Handout    Comprehension  Verbal cues required;Returned demonstration;Verbalized understanding          PT Long Term Goals - 07/28/18 1209      PT LONG TERM GOAL #1   Title  I with advanced HEP ( 09/08/2018)     Time  6    Period  Weeks    Status  New    Target Date  09/08/18      PT LONG TERM GOAL #2   Title  improve Rt cervical rotation =/> 75 degrees to assist with looking over her shoulder ( 09/08/2018)     Time  6    Period  Weeks    Status  New    Target Date  09/08/18      PT LONG TERM GOAL #3   Title  improve upper back strength =/> 5-/5 to allow her to play violin with no more than 2/10 pain ( 09/08/2018)     Time  6    Period  Weeks    Status  New    Target Date  09/08/18      PT LONG TERM GOAL #4   Title  report =/> 75% reduction in feelings of tightness/pain in the Rt upper shoulder/neck ( 09/08/2018)     Time  6    Period  Weeks  Status  New    Target Date  09/08/18      PT LONG TERM GOAL #5   Title  improve FOTO =/< 37% limited ( 09/08/2018)     Time  6    Period  Weeks    Status  New    Target Date  09/08/18             Plan - 07/28/18 1204    Clinical Impression Statement  73 yo female returns to therapy for her neck and Rt upper shoulder pain.  She went out of town after her last visit and didn't return until after the New Year.  She had performed some of her exercises while away however when she demo'd them she was not performing them correctly.  Correct technique was reviewed  today.  Her cervical ROM is more limited, shoulder ROM WNL, she has some weakness in her upper back and significant tightness in her Rt upper trap/levator and SCM.  She would benefit from PT to decrease the tightness and pain and restore function.  she wishes to start violin lessons and was encouraged to wait until March.      History and Personal Factors relevant to plan of care:  kidney transplant years ago, aneurysm - currently getting a second opinion.     Clinical Presentation  Stable    Clinical Decision Making  Low    Rehab Potential  Excellent    PT Frequency  2x / week    PT Duration  6 weeks    PT Treatment/Interventions  Spinal Manipulations;Dry needling;Neuromuscular re-education;Manual techniques;Moist Heat;Traction;Therapeutic activities;Patient/family education;Taping;Ultrasound;Cryotherapy;Electrical Stimulation;Therapeutic exercise;Iontophoresis 4mg /ml Dexamethasone;Passive range of motion    PT Next Visit Plan  manual work to Rt upper trap area, postural correction ex, modalities PRN    Consulted and Agree with Plan of Care  Patient       Patient will benefit from skilled therapeutic intervention in order to improve the following deficits and impairments:  Pain, Postural dysfunction, Increased muscle spasms, Decreased range of motion, Decreased strength, Impaired UE functional use  Visit Diagnosis: Cervicalgia - Plan: PT plan of care cert/re-cert  Muscle weakness (generalized) - Plan: PT plan of care cert/re-cert  Other symptoms and signs involving the musculoskeletal system - Plan: PT plan of care cert/re-cert     Problem List Patient Active Problem List   Diagnosis Date Noted  . Osteoarthritis cervical spine 04/14/2018  . 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 Lipodermatosclerosis 05/10/2015  . Health care maintenance 06/08/2014  . Hypothyroidism 06/01/2014  . Hypertension 06/01/2014  . Hepatic steatosis 08/06/2013  . Diabetic  neuropathy ([place]) 05/08/2013  . History of kidney transplant 05/08/2013  . Hyperlipidemia associated with type 2 diabetes mellitus ([place]) 07/30/2011  . AAA (abdominal aortic aneurysm) without rupture ([place]) 07/09/2011  . 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 [place], [place], [address] Phone: [phone]   Fax:  [phone]  Name: [name] MRN: [id number] Date of Birth: 03-05-1946

## 2018-07-28 NOTE — Patient Instructions (Signed)
  Plus printed out previous HEP and performed.

## 2018-07-30 DIAGNOSIS — M21612 Bunion of left foot: Secondary | ICD-10-CM | POA: Diagnosis not present

## 2018-07-30 DIAGNOSIS — M21611 Bunion of right foot: Secondary | ICD-10-CM | POA: Diagnosis not present

## 2018-07-30 DIAGNOSIS — M2042 Other hammer toe(s) (acquired), left foot: Secondary | ICD-10-CM | POA: Diagnosis not present

## 2018-07-30 DIAGNOSIS — M2041 Other hammer toe(s) (acquired), right foot: Secondary | ICD-10-CM | POA: Diagnosis not present

## 2018-08-01 ENCOUNTER — Ambulatory Visit: Payer: Medicare Other | Admitting: Physical Therapy

## 2018-08-04 ENCOUNTER — Other Ambulatory Visit: Payer: Self-pay | Admitting: Student in an Organized Health Care Education/Training Program

## 2018-08-04 ENCOUNTER — Telehealth: Payer: Self-pay | Admitting: Dietician

## 2018-08-04 ENCOUNTER — Telehealth: Payer: Self-pay

## 2018-08-04 ENCOUNTER — Encounter: Payer: Medicare Other | Admitting: Physical Therapy

## 2018-08-04 NOTE — Telephone Encounter (Signed)
Requesting a referral to podiatry.

## 2018-08-05 NOTE — Telephone Encounter (Signed)
[name]'s call was transferred to me because she asked about a meter problem that she thinks is her meter battery. I told her about a recall of meters and offered the toll free number to call, but [name] says she has never replaced her meter battery and prefers to try that first.

## 2018-08-06 ENCOUNTER — Telehealth: Payer: Self-pay | Admitting: *Deleted

## 2018-08-06 NOTE — Telephone Encounter (Signed)
PATIENT SEEN AT [facility] FEB. 2020.

## 2018-08-08 ENCOUNTER — Other Ambulatory Visit: Payer: Self-pay | Admitting: Student in an Organized Health Care Education/Training Program

## 2018-08-08 ENCOUNTER — Ambulatory Visit: Payer: Medicare Other | Admitting: Physical Therapy

## 2018-08-08 ENCOUNTER — Encounter: Payer: Self-pay | Admitting: Physical Therapy

## 2018-08-08 DIAGNOSIS — R29898 Other symptoms and signs involving the musculoskeletal system: Secondary | ICD-10-CM | POA: Diagnosis not present

## 2018-08-08 DIAGNOSIS — E1143 Type 2 diabetes mellitus with diabetic autonomic (poly)neuropathy: Secondary | ICD-10-CM

## 2018-08-08 DIAGNOSIS — M6281 Muscle weakness (generalized): Secondary | ICD-10-CM

## 2018-08-08 DIAGNOSIS — M542 Cervicalgia: Secondary | ICD-10-CM | POA: Diagnosis not present

## 2018-08-08 DIAGNOSIS — Z794 Long term (current) use of insulin: Principal | ICD-10-CM

## 2018-08-08 MED ORDER — GLUCOSE BLOOD VI STRP: ORAL_STRIP | 5 refills | Status: DC

## 2018-08-08 NOTE — Telephone Encounter (Signed)
Needs refill on glucose blood (ACCU-CHEK AVIVA) test strip,  Aspirn 81mg  [facility] [phone] - [place], [place] - [place] ;pt contact [phone]   Pls said that she has been waiting all week

## 2018-08-08 NOTE — Therapy (Signed)
[place] [place], [place], [address] Phone: [phone]   Fax:  [phone]  Physical Therapy Treatment  Patient Details  Name: [name] MRN: [id number] Date of Birth: 07-22-1945 Referring Provider (PT): Dr Lalla Brothers   Encounter Date: 08/08/2018  PT End of Session - 08/08/18 0847    Visit Number  2    Number of Visits  12    Date for PT Re-Evaluation  09/08/18    Authorization Type  10th visit progress note     PT Start Time  0802    PT Stop Time  0858    PT Time Calculation (min)  56 min    Activity Tolerance  Patient tolerated treatment well    Behavior During Therapy  [facility] for tasks assessed/performed       Past Medical History:  Diagnosis Date  . AAA (abdominal aortic aneurysm) (HCC)    3.8 cm Infrarenal managed at [facility]  . Clotting disorder ([place])    PE, IVC filter placed at [facility]  . Diabetes mellitus   . Hypertension   . Hypothyroidism   . Morbid obesity (HCC)    BMI 34  . Renal transplant recipient    [facility]    Past Surgical History:  Procedure Laterality Date  . ABDOMINAL AORTIC ANEURYSM REPAIR    . KIDNEY TRANSPLANT      There were no vitals filed for this visit.  Subjective Assessment - 08/08/18 0805    Subjective  Pt reports having some soreness from doing her exercises. Pt reports buying a heating pad to use at home.     Currently in Pain?  No/denies    Pain Descriptors / Indicators  Sore    Aggravating Factors   doing daily activities; picking up things with right hand    Pain Relieving Factors  tylenol                       [facility] Adult PT Treatment/Exercise - 08/08/18 0001      Self-Care   Self-Care  Posture;Other Self-Care Comments    Posture  Importance of upright posture during sitting and not leaning head forward which could be causing her upper traps to be tight.    Other Self-Care Comments   Educated and reviewed effective  stretches for upper traps and levator scap and explained the muscle actions and attachment points.      Neck Exercises: Seated   Neck Retraction  10 reps    Shoulder Rolls  Backwards;10 reps    Other Seated Exercise  scap retraction x 10     Other Seated Exercise  levator scap stretch x3 20 secs      Moist Heat Therapy   Number Minutes Moist Heat  15 Minutes    Moist Heat Location  Cervical;Shoulder      Manual Therapy   Manual Therapy  Soft tissue mobilization    Soft tissue mobilization  MTPR with STM to upper traps with gentle manual traction      Neck Exercises: Stretches   Upper Trapezius Stretch  Left;Right;30 seconds;3 reps    Levator Stretch  Right;20 seconds;3 reps    Other Neck Stretches  low and mid doorway stretch 2x 30 sec                  PT Long Term Goals - 07/28/18 1209      PT LONG TERM GOAL #1   Title  I with advanced HEP ( 09/08/2018)     Time  6    Period  Weeks    Status  New    Target Date  09/08/18      PT LONG TERM GOAL #2   Title  improve Rt cervical rotation =/> 75 degrees to assist with looking over her shoulder ( 09/08/2018)     Time  6    Period  Weeks    Status  New    Target Date  09/08/18      PT LONG TERM GOAL #3   Title  improve upper back strength =/> 5-/5 to allow her to play violin with no more than 2/10 pain ( 09/08/2018)     Time  6    Period  Weeks    Status  New    Target Date  09/08/18      PT LONG TERM GOAL #4   Title  report =/> 75% reduction in feelings of tightness/pain in the Rt upper shoulder/neck ( 09/08/2018)     Time  6    Period  Weeks    Status  New    Target Date  09/08/18      PT LONG TERM GOAL #5   Title  improve FOTO =/< 37% limited ( 09/08/2018)     Time  6    Period  Weeks    Status  New    Target Date  09/08/18            Plan - 08/08/18 0937    Clinical Impression Statement  Ms. Fosdick denies any pain today and did not provide a pain number, but reports some residual soreness from  performing her exercises. PTA reviewed her HEP with her and specified the frequency of performing them. Educated pt on basic anatomy of upper traps and levator scap, along with ways to prevent them from getting tight. PTA provided manual therapy to upper traps and levator scap. Pt reported that her shoulder felt better at end of treatment.     PT Next Visit Plan  manual work to Rt upper trap area, postural correction ex, modalities PRN; update HEP, continue manual therapy    PT Home Exercise Plan  upper trap/levator scap stretch, doorway stretch, scap retractions, shoulder rolls    Consulted and Agree with Plan of Care  Patient     During this treatment session, the therapist was present, participating in and directing the treatment.   Patient will benefit from skilled therapeutic intervention in order to improve the following deficits and impairments:  Pain, Postural dysfunction, Increased muscle spasms, Decreased range of motion, Decreased strength, Impaired UE functional use  Visit Diagnosis: Cervicalgia  Muscle weakness (generalized)  Other symptoms and signs involving the musculoskeletal system     Problem List Patient Active Problem List   Diagnosis Date Noted  . Osteoarthritis cervical spine 04/14/2018  . 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 Lipodermatosclerosis 05/10/2015  . Health care maintenance 06/08/2014  . Hypothyroidism 06/01/2014  . Hypertension 06/01/2014  . Hepatic steatosis 08/06/2013  . Diabetic neuropathy ([place]) 05/08/2013  . History of kidney transplant 05/08/2013  . Hyperlipidemia associated with type 2 diabetes mellitus ([place]) 07/30/2011  . AAA (abdominal aortic aneurysm) without rupture ([place]) 07/09/2011  . 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 [place], [place], [address] Phone: [phone]   Fax:  [phone]  Name: [name] MRN: [id number] Date of Birth: 15-Jan-1946

## 2018-08-11 ENCOUNTER — Ambulatory Visit: Payer: Medicare Other | Admitting: Physical Therapy

## 2018-08-11 DIAGNOSIS — R29898 Other symptoms and signs involving the musculoskeletal system: Secondary | ICD-10-CM

## 2018-08-11 DIAGNOSIS — M542 Cervicalgia: Secondary | ICD-10-CM | POA: Diagnosis not present

## 2018-08-11 DIAGNOSIS — M6281 Muscle weakness (generalized): Secondary | ICD-10-CM

## 2018-08-11 NOTE — Therapy (Signed)
[place], [place], [address] Phone: [phone]   Fax:  [phone]  Physical Therapy Treatment  Patient Details  Name: [name] MRN: [id number] Date of Birth: 07/12/1945 Referring Provider (PT): Dr Lalla Brothers   Encounter Date: 08/11/2018  PT End of Session - 08/11/18 0909    Visit Number  3    Number of Visits  12    Date for PT Re-Evaluation  09/08/18    Authorization Type  10th visit progress note     PT Start Time  0845    PT Stop Time  0940    PT Time Calculation (min)  55 min       Past Medical History:  Diagnosis Date  . AAA (abdominal aortic aneurysm) (HCC)    3.8 cm Infrarenal managed at [facility]  . Clotting disorder ([place])    PE, IVC filter placed at [facility]  . Diabetes mellitus   . Hypertension   . Hypothyroidism   . Morbid obesity (HCC)    BMI 34  . Renal transplant recipient    [facility]    Past Surgical History:  Procedure Laterality Date  . ABDOMINAL AORTIC ANEURYSM REPAIR    . KIDNEY TRANSPLANT      There were no vitals filed for this visit.                    [facility] Adult PT Treatment/Exercise - 08/11/18 0001      Neck Exercises: Seated   Neck Retraction  10 reps    Other Seated Exercise  scap retraction x 10     Other Seated Exercise  yellow band horizontal and ER       Neck Exercises: Supine   Neck Retraction  10 reps    Other Supine Exercise  supine scap squeeze x 10     Other Supine Exercise  supine chin tuck x 10       Moist Heat Therapy   Number Minutes Moist Heat  15 Minutes    Moist Heat Location  Cervical;Shoulder      Neck Exercises: Stretches   Levator Stretch  Right;20 seconds;3 reps    Corner Stretch  2 reps;20 seconds    Other Neck Stretches  AROM side bend, rotation                  PT Long Term Goals - 07/28/18 1209      PT LONG TERM GOAL #1   Title  I with advanced HEP ( 09/08/2018)     Time  6     Period  Weeks    Status  New    Target Date  09/08/18      PT LONG TERM GOAL #2   Title  improve Rt cervical rotation =/> 75 degrees to assist with looking over her shoulder ( 09/08/2018)     Time  6    Period  Weeks    Status  New    Target Date  09/08/18      PT LONG TERM GOAL #3   Title  improve upper back strength =/> 5-/5 to allow her to play violin with no more than 2/10 pain ( 09/08/2018)     Time  6    Period  Weeks    Status  New    Target Date  09/08/18      PT LONG TERM GOAL #4   Title  report =/> 75%  reduction in feelings of tightness/pain in the Rt upper shoulder/neck ( 09/08/2018)     Time  6    Period  Weeks    Status  New    Target Date  09/08/18      PT LONG TERM GOAL #5   Title  improve FOTO =/< 37% limited ( 09/08/2018)     Time  6    Period  Weeks    Status  New    Target Date  09/08/18            Plan - 08/11/18 0924    Clinical Impression Statement  Pt reports improvement however still have stiffness posterior neck and shoulder with right side more bothersome. reviewed and progressed HEP with max cues for posture during therex. Updated HEP with yellow band scap stab, She would benefit from thoraacic mobility.     PT Next Visit Plan  add trunk extension and rotation     PT Home Exercise Plan  upper trap/levator scap stretch, doorway stretch, scap retractions, shoulder rolls, yellow band supine horizontals and ER     Consulted and Agree with Plan of Care  Patient       Patient will benefit from skilled therapeutic intervention in order to improve the following deficits and impairments:  Pain, Postural dysfunction, Increased muscle spasms, Decreased range of motion, Decreased strength, Impaired UE functional use  Visit Diagnosis: Cervicalgia  Muscle weakness (generalized)  Other symptoms and signs involving the musculoskeletal system     Problem List Patient Active Problem List   Diagnosis Date Noted  . Osteoarthritis cervical spine  04/14/2018  . 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 Lipodermatosclerosis 05/10/2015  . Health care maintenance 06/08/2014  . Hypothyroidism 06/01/2014  . Hypertension 06/01/2014  . Hepatic steatosis 08/06/2013  . Diabetic neuropathy ([place]) 05/08/2013  . History of kidney transplant 05/08/2013  . Hyperlipidemia associated with type 2 diabetes mellitus ([place]) 07/30/2011  . AAA (abdominal aortic aneurysm) without rupture ([place]) 07/09/2011  . 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 [place], [place], [address] Phone: [phone]   Fax:  [phone]  Name: CARLYNN LEDUC MRN: [id number] Date of Birth: 01/07/1946

## 2018-08-11 NOTE — Patient Instructions (Signed)
Side Pull: Double Arm   On back, knees bent, feet flat. Arms perpendicular to body, shoulder level, elbows straight but relaxed. Pull arms out to sides, elbows straight. Resistance band comes across collarbones, hands toward floor. Hold momentarily. Slowly return to starting position. Repeat _10-20__ times. Band color __Y___    Shoulder Rotation: Double Arm   On back, knees bent, feet flat, elbows tucked at sides, bent 90, hands palms up. Pull hands apart and down toward floor, keeping elbows near sides. Hold momentarily. Slowly return to starting position. Repeat _10-20__ times. Band color __Y____

## 2018-08-13 DIAGNOSIS — Z940 Kidney transplant status: Secondary | ICD-10-CM | POA: Diagnosis not present

## 2018-08-13 DIAGNOSIS — T82330S Leakage of aortic (bifurcation) graft (replacement), sequela: Secondary | ICD-10-CM | POA: Diagnosis not present

## 2018-08-13 DIAGNOSIS — I714 Abdominal aortic aneurysm, without rupture: Secondary | ICD-10-CM | POA: Diagnosis not present

## 2018-08-13 DIAGNOSIS — I10 Essential (primary) hypertension: Secondary | ICD-10-CM | POA: Diagnosis not present

## 2018-08-13 DIAGNOSIS — Z95828 Presence of other vascular implants and grafts: Secondary | ICD-10-CM | POA: Diagnosis not present

## 2018-08-13 DIAGNOSIS — E785 Hyperlipidemia, unspecified: Secondary | ICD-10-CM | POA: Diagnosis not present

## 2018-08-15 ENCOUNTER — Ambulatory Visit: Payer: Medicare Other | Admitting: Physical Therapy

## 2018-08-18 ENCOUNTER — Encounter: Payer: Self-pay | Admitting: Physical Therapy

## 2018-08-18 ENCOUNTER — Ambulatory Visit: Payer: Medicare Other | Admitting: Physical Therapy

## 2018-08-18 DIAGNOSIS — M542 Cervicalgia: Secondary | ICD-10-CM

## 2018-08-18 DIAGNOSIS — M6281 Muscle weakness (generalized): Secondary | ICD-10-CM | POA: Diagnosis not present

## 2018-08-18 DIAGNOSIS — R29898 Other symptoms and signs involving the musculoskeletal system: Secondary | ICD-10-CM | POA: Diagnosis not present

## 2018-08-18 NOTE — Therapy (Signed)
[place], [place], [address] Phone: [phone]   Fax:  [phone]  Physical Therapy Treatment  Patient Details  Name: [name] MRN: [id number] Date of Birth: 10/15/1945 Referring Provider (PT): Dr Lalla Brothers   Encounter Date: 08/18/2018  PT End of Session - 08/18/18 0946    Visit Number  4    Number of Visits  12    Date for PT Re-Evaluation  09/08/18    Authorization Type  10th visit progress note     PT Start Time  0932    PT Stop Time  1022    PT Time Calculation (min)  50 min       Past Medical History:  Diagnosis Date  . AAA (abdominal aortic aneurysm) (HCC)    3.8 cm Infrarenal managed at [facility]  . Clotting disorder ([place])    PE, IVC filter placed at [facility]  . Diabetes mellitus   . Hypertension   . Hypothyroidism   . Morbid obesity (HCC)    BMI 34  . Renal transplant recipient    [facility]    Past Surgical History:  Procedure Laterality Date  . ABDOMINAL AORTIC ANEURYSM REPAIR    . KIDNEY TRANSPLANT      There were no vitals filed for this visit.  Subjective Assessment - 08/18/18 0933    Subjective  The neck is alot better. I was really sore the after last visit.     Currently in Pain?  No/denies         [facility] PT Assessment - 08/18/18 0001      AROM   Cervical - Right Rotation  70    Cervical - Left Rotation  72                   [facility] Adult PT Treatment/Exercise - 08/18/18 0001      Neck Exercises: Seated   Shoulder Rolls  Backwards;10 reps    Other Seated Exercise  red band horizontal abduction, ER, pullovers       Neck Exercises: Supine   Neck Retraction  10 reps    Other Supine Exercise  supine scap squeeze x 10 , with towel roll along spine     Other Supine Exercise  supine chin tuck x 10       Moist Heat Therapy   Number Minutes Moist Heat  10 Minutes    Moist Heat Location  Cervical      Neck Exercises: Stretches   Upper  Trapezius Stretch  Left;Right;30 seconds;3 reps             PT Education - 08/18/18 1013    Education Details  HEP    Person(s) Educated  Patient    Methods  Explanation;Handout    Comprehension  Verbalized understanding          PT Long Term Goals - 08/18/18 1006      PT LONG TERM GOAL #1   Title  I with advanced HEP ( 09/08/2018)     Baseline  needs min cues     Time  6    Period  Weeks    Status  On-going      PT LONG TERM GOAL #2   Title  improve Rt cervical rotation =/> 75 degrees to assist with looking over her shoulder ( 09/08/2018)     Baseline  70right 72 left     Time  6    Period  Weeks    Status  On-going      PT LONG TERM GOAL #3   Title  improve upper back strength =/> 5-/5 to allow her to play violin with no more than 2/10 pain ( 09/08/2018)     Time  6    Period  Weeks    Status  Unable to assess      PT LONG TERM GOAL #4   Title  report =/> 75% reduction in feelings of tightness/pain in the Rt upper shoulder/neck ( 09/08/2018)     Baseline  60% reduction    Time  6    Period  Weeks    Status  On-going      PT LONG TERM GOAL #5   Title  improve FOTO =/< 37% limited ( 09/08/2018)     Time  6    Period  Weeks    Status  On-going            Plan - 08/18/18 0935    Clinical Impression Statement  pt reports 60% redution in pain and tightness in right shoulder and neck pain. Her AROM for cervical rotation has improved. Her FOTO score was worse depsite her reports of improvement.     PT Next Visit Plan  continue thoracic mobility and scap stab, manual prn    PT Home Exercise Plan  upper trap/levator scap stretch, doorway stretch, scap retractions, shoulder rolls, yellow band supine horizontals and ER     Consulted and Agree with Plan of Care  Patient       Patient will benefit from skilled therapeutic intervention in order to improve the following deficits and impairments:  Pain, Postural dysfunction, Increased muscle spasms, Decreased range  of motion, Decreased strength, Impaired UE functional use  Visit Diagnosis: Cervicalgia  Muscle weakness (generalized)  Other symptoms and signs involving the musculoskeletal system     Problem List Patient Active Problem List   Diagnosis Date Noted  . Osteoarthritis cervical spine 04/14/2018  . 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 Lipodermatosclerosis 05/10/2015  . Health care maintenance 06/08/2014  . Hypothyroidism 06/01/2014  . Hypertension 06/01/2014  . Hepatic steatosis 08/06/2013  . Diabetic neuropathy ([place]) 05/08/2013  . History of kidney transplant 05/08/2013  . Hyperlipidemia associated with type 2 diabetes mellitus ([place]) 07/30/2011  . AAA (abdominal aortic aneurysm) without rupture ([place]) 07/09/2011  . 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

## 2018-08-20 ENCOUNTER — Ambulatory Visit: Payer: Medicare Other | Admitting: Physical Therapy

## 2018-08-20 ENCOUNTER — Telehealth: Payer: Self-pay | Admitting: Physical Therapy

## 2018-08-20 ENCOUNTER — Encounter: Payer: Self-pay | Admitting: Physical Therapy

## 2018-08-20 DIAGNOSIS — R29898 Other symptoms and signs involving the musculoskeletal system: Secondary | ICD-10-CM | POA: Diagnosis not present

## 2018-08-20 DIAGNOSIS — M542 Cervicalgia: Secondary | ICD-10-CM

## 2018-08-20 DIAGNOSIS — M6281 Muscle weakness (generalized): Secondary | ICD-10-CM | POA: Diagnosis not present

## 2018-08-20 NOTE — Therapy (Signed)
[place], [place], [address] Phone: [phone]   Fax:  [phone]  Physical Therapy Treatment  Patient Details  Name: [name] MRN: [id number] Date of Birth: [birth year] Referring Provider (PT): Dr Lalla Brothers   Encounter Date: 08/20/2018  PT End of Session - 08/20/18 0816    Visit Number  5    Number of Visits  12    Date for PT Re-Evaluation  09/08/18    Authorization Type  10th visit progress note     PT Start Time  0815   in late   PT Stop Time  0855    PT Time Calculation (min)  40 min    Activity Tolerance  Patient tolerated treatment well       Past Medical History:  Diagnosis Date  . AAA (abdominal aortic aneurysm) (HCC)    3.8 cm Infrarenal managed at [facility]  . Clotting disorder ([place])    PE, IVC filter placed at [facility]  . Diabetes mellitus   . Hypertension   . Hypothyroidism   . Morbid obesity (HCC)    BMI 34  . Renal transplant recipient    [facility]    Past Surgical History:  Procedure Laterality Date  . ABDOMINAL AORTIC ANEURYSM REPAIR    . KIDNEY TRANSPLANT      There were no vitals filed for this visit.  Subjective Assessment - 08/20/18 0817    Subjective  Pt reports she is doing her HEP some - reports she is sore after her sessions so she takes time off. Pt reports she is having AAA repair on 09/09/2018    Pertinent History  kidney transplant    Patient Stated Goals  get it so her upper shoulders feel the same on both sides, get rid of the tightness and soreness. play voilin     Currently in Pain?  No/denies    Aggravating Factors   turning head to the left                       [facility] Adult PT Treatment/Exercise - 08/20/18 0001      Exercises   Exercises  Neck      Neck Exercises: Machines for Strengthening   UBE (Upper Arm Bike)  L1x3' BWD only      Neck Exercises: Supine   Neck Retraction  10 reps   with head off EOB on  physioball   Other Supine Exercise  10 reps shoulder presses followed by chest opener stretch    Other Supine Exercise  on half bolster, green band, 10 reps shoulder horizonta abduction, ER and overhead        Moist Heat Therapy   Number Minutes Moist Heat  15 Minutes    Moist Heat Location  Cervical      Neck Exercises: Stretches   Other Neck Stretches  doorway low and mid x 30 sec each, VC for form                  PT Long Term Goals - 08/18/18 1006      PT LONG TERM GOAL #1   Title  I with advanced HEP ( 09/08/2018)     Baseline  needs min cues     Time  6    Period  Weeks    Status  On-going      PT LONG TERM GOAL #2   Title  improve Rt cervical rotation =/>  75 degrees to assist with looking over her shoulder ( 09/08/2018)     Baseline  70right 72 left     Time  6    Period  Weeks    Status  On-going      PT LONG TERM GOAL #3   Title  improve upper back strength =/> 5-/5 to allow her to play violin with no more than 2/10 pain ( 09/08/2018)     Time  6    Period  Weeks    Status  Unable to assess      PT LONG TERM GOAL #4   Title  report =/> 75% reduction in feelings of tightness/pain in the Rt upper shoulder/neck ( 09/08/2018)     Baseline  60% reduction    Time  6    Period  Weeks    Status  On-going      PT LONG TERM GOAL #5   Title  improve FOTO =/< 37% limited ( 09/08/2018)     Time  6    Period  Weeks    Status  On-going            Plan - 08/20/18 9379    Clinical Impression Statement  [name] reports she is better than when she started therapy,  has neck pain with Lt rotation now.  Tolerated strengthening/cervical stabiltiy exercises well.  She continues to make improvements.  She will be having surgery to repair a AAA on 09/09/2018    Rehab Potential  Excellent    PT Frequency  2x / week    PT Duration  6 weeks    PT Treatment/Interventions  Spinal Manipulations;Dry needling;Neuromuscular re-education;Manual techniques;Moist  Heat;Traction;Therapeutic activities;Patient/family education;Taping;Ultrasound;Cryotherapy;Electrical Stimulation;Therapeutic exercise;Iontophoresis 4mg /ml Dexamethasone;Passive range of motion    PT Next Visit Plan  progress HEP, continue thoracic mobility and scap stab, manual prn    Consulted and Agree with Plan of Care  Patient       Patient will benefit from skilled therapeutic intervention in order to improve the following deficits and impairments:  Pain, Postural dysfunction, Increased muscle spasms, Decreased range of motion, Decreased strength, Impaired UE functional use  Visit Diagnosis: Cervicalgia  Muscle weakness (generalized)  Other symptoms and signs involving the musculoskeletal system     Problem List Patient Active Problem List   Diagnosis Date Noted  . Osteoarthritis cervical spine 04/14/2018  . 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 Lipodermatosclerosis 05/10/2015  . Health care maintenance 06/08/2014  . Hypothyroidism 06/01/2014  . Hypertension 06/01/2014  . Hepatic steatosis 08/06/2013  . Diabetic neuropathy ([place]) 05/08/2013  . History of kidney transplant 05/08/2013  . Hyperlipidemia associated with type 2 diabetes mellitus ([place]) 07/30/2011  . AAA (abdominal aortic aneurysm) without rupture ([place]) 07/09/2011  . 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) 05/08/2009    Jeral Pinch PT  08/20/2018, 8:46 AM  [facility] [address] [place], [place], [address] Phone: [phone]   Fax:  [phone]  Name: [name] MRN: [id number] Date of Birth: 12/31/1945

## 2018-08-20 NOTE — Telephone Encounter (Signed)
Pt arrived late for appointment as reminder call was being placed.  She was seen and treated.

## 2018-08-22 ENCOUNTER — Encounter: Payer: Medicare Other | Admitting: Physical Therapy

## 2018-08-26 ENCOUNTER — Encounter: Payer: Self-pay | Admitting: Physical Therapy

## 2018-08-26 ENCOUNTER — Ambulatory Visit
Payer: Medicare Other | Attending: Student in an Organized Health Care Education/Training Program | Admitting: Physical Therapy

## 2018-08-26 DIAGNOSIS — M6281 Muscle weakness (generalized): Secondary | ICD-10-CM | POA: Diagnosis not present

## 2018-08-26 DIAGNOSIS — M542 Cervicalgia: Secondary | ICD-10-CM | POA: Diagnosis not present

## 2018-08-26 DIAGNOSIS — R29898 Other symptoms and signs involving the musculoskeletal system: Secondary | ICD-10-CM | POA: Insufficient documentation

## 2018-08-26 NOTE — Therapy (Signed)
[place], [place], [address] Phone: [phone]   Fax:  [phone]  Physical Therapy Treatment  Patient Details  Name: [name] MRN: [id number] Date of Birth: 1946-03-29 Referring Provider (PT): Dr Lalla Brothers   Encounter Date: 08/26/2018  PT End of Session - 08/26/18 0908    Visit Number  6    Number of Visits  12    Date for PT Re-Evaluation  09/08/18    Authorization Type  10th visit progress note     PT Start Time  0842    PT Stop Time  0930    PT Time Calculation (min)  48 min       Past Medical History:  Diagnosis Date  . AAA (abdominal aortic aneurysm) (HCC)    3.8 cm Infrarenal managed at [facility]  . Clotting disorder ([place])    PE, IVC filter placed at [facility]  . Diabetes mellitus   . Hypertension   . Hypothyroidism   . Morbid obesity (HCC)    BMI 34  . Renal transplant recipient    [facility]    Past Surgical History:  Procedure Laterality Date  . ABDOMINAL AORTIC ANEURYSM REPAIR    . KIDNEY TRANSPLANT      There were no vitals filed for this visit.  Subjective Assessment - 08/26/18 0847    Subjective  Pt arrives on the phone trying to make arrangements with social services for transportation to her PT appointment. She reports hitting her head this morning on the car as she was getting in.     Pertinent History  kidney transplant    Currently in Pain?  No/denies    Pain Score  0-No pain   just usual soreness    Pain Location  Neck    Pain Orientation  Posterior    Pain Descriptors / Indicators  Sore    Aggravating Factors   getting better with turning     Pain Relieving Factors  tylenol                       [facility] Adult PT Treatment/Exercise - 08/26/18 0001      Neck Exercises: Machines for Strengthening   UBE (Upper Arm Bike)  L1x4' BWD only      Neck Exercises: Theraband   Shoulder Extension  10 reps    Shoulder Extension Limitations   red     Rows  20 reps    Rows Limitations  red       Neck Exercises: Supine   Neck Retraction  10 reps    Neck Retraction Limitations  towel roll    Other Supine Exercise  supine scap stab series  with towel vertical along spine       Neck Exercises: Sidelying   Other Sidelying Exercise  upper trunk rotations (book opens)       Moist Heat Therapy   Number Minutes Moist Heat  15 Minutes    Moist Heat Location  Cervical      Manual Therapy   Soft tissue mobilization  Right upper trap, suboccipital and paraspinal       Neck Exercises: Stretches   Upper Trapezius Stretch  Left;Right;30 seconds;3 reps    Other Neck Stretches  doorway low and mid x 30 sec each, VC for form                  PT Long Term Goals - 08/18/18 1006  PT LONG TERM GOAL #1   Title  I with advanced HEP ( 09/08/2018)     Baseline  needs min cues     Time  6    Period  Weeks    Status  On-going      PT LONG TERM GOAL #2   Title  improve Rt cervical rotation =/> 75 degrees to assist with looking over her shoulder ( 09/08/2018)     Baseline  70right 72 left     Time  6    Period  Weeks    Status  On-going      PT LONG TERM GOAL #3   Title  improve upper back strength =/> 5-/5 to allow her to play violin with no more than 2/10 pain ( 09/08/2018)     Time  6    Period  Weeks    Status  Unable to assess      PT LONG TERM GOAL #4   Title  report =/> 75% reduction in feelings of tightness/pain in the Rt upper shoulder/neck ( 09/08/2018)     Baseline  60% reduction    Time  6    Period  Weeks    Status  On-going      PT LONG TERM GOAL #5   Title  improve FOTO =/< 37% limited ( 09/08/2018)     Time  6    Period  Weeks    Status  On-going            Plan - 08/26/18 0948    Clinical Impression Statement  Pt reports increased soreness in neck after hitting head on car as she got in this morning. Continued with scapular/ neck  stab and manual for right neck musculature. Difficulty with  right sided neck spasms during chin tucks. Musculature softened after manual soft tissue work.    PT Next Visit Plan  (Having AAA surgery on 09/09/2018? progress HEP, continue thoracic mobility and scap stab, manual prn, add open books to HEP     PT Home Exercise Plan  upper trap/levator scap stretch, doorway stretch, scap retractions, shoulder rolls, yellow band supine horizontals and ER     Consulted and Agree with Plan of Care  Patient       Patient will benefit from skilled therapeutic intervention in order to improve the following deficits and impairments:  Pain, Postural dysfunction, Increased muscle spasms, Decreased range of motion, Decreased strength, Impaired UE functional use  Visit Diagnosis: Cervicalgia  Muscle weakness (generalized)  Other symptoms and signs involving the musculoskeletal system     Problem List Patient Active Problem List   Diagnosis Date Noted  . Osteoarthritis cervical spine 04/14/2018  . 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 Lipodermatosclerosis 05/10/2015  . Health care maintenance 06/08/2014  . Hypothyroidism 06/01/2014  . Hypertension 06/01/2014  . Hepatic steatosis 08/06/2013  . Diabetic neuropathy ([place]) 05/08/2013  . History of kidney transplant 05/08/2013  . Hyperlipidemia associated with type 2 diabetes mellitus ([place]) 07/30/2011  . AAA (abdominal aortic aneurysm) without rupture ([place]) 07/09/2011  . 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 [place], [place], [address] Phone: [phone]   Fax:  [phone]  Name: [name] MRN: [id number] Date of Birth: 25-Nov-1945

## 2018-08-27 ENCOUNTER — Encounter: Payer: Self-pay | Admitting: Physical Therapy

## 2018-08-27 ENCOUNTER — Ambulatory Visit: Payer: Medicare Other | Admitting: Physical Therapy

## 2018-08-27 DIAGNOSIS — R29898 Other symptoms and signs involving the musculoskeletal system: Secondary | ICD-10-CM | POA: Diagnosis not present

## 2018-08-27 DIAGNOSIS — M542 Cervicalgia: Secondary | ICD-10-CM | POA: Diagnosis not present

## 2018-08-27 DIAGNOSIS — M6281 Muscle weakness (generalized): Secondary | ICD-10-CM | POA: Diagnosis not present

## 2018-08-27 NOTE — Therapy (Addendum)
[place], [place], [address] Phone: [phone]   Fax:  [phone]  Physical Therapy Treatment/discharge  Patient Details  Name: [name] MRN: [id number] Date of Birth: [birth year] Referring Provider (PT): Dr Lalla Brothers   Encounter Date: 08/27/2018  PT End of Session - 08/27/18 0847    Visit Number  7    Number of Visits  12    Date for PT Re-Evaluation  09/08/18    Authorization Type  10th visit progress note     PT Start Time  0847    PT Stop Time  0938   heat at end of session   PT Time Calculation (min)  51 min    Activity Tolerance  Patient tolerated treatment well       Past Medical History:  Diagnosis Date  . AAA (abdominal aortic aneurysm) (HCC)    3.8 cm Infrarenal managed at [facility]  . Clotting disorder ([place])    PE, IVC filter placed at [facility]  . Diabetes mellitus   . Hypertension   . Hypothyroidism   . Morbid obesity (HCC)    BMI 34  . Renal transplant recipient    [facility]    Past Surgical History:  Procedure Laterality Date  . ABDOMINAL AORTIC ANEURYSM REPAIR    . KIDNEY TRANSPLANT      There were no vitals filed for this visit.  Subjective Assessment - 08/27/18 0848    Subjective  Pt reports she is feeling better    Currently in Pain?  No/denies         [facility] PT Assessment - 08/27/18 0001      AROM   Cervical - Right Rotation  60    Cervical - Left Rotation  76                   [facility] Adult PT Treatment/Exercise - 08/27/18 0001      Neck Exercises: Machines for Strengthening   UBE (Upper Arm Bike)  L1x4' BWD only      Neck Exercises: Standing   Lift / Chop  20 reps;Theraband    Theraband Level (Lift/Chop)  Level 4 (Blue)    Other Standing Exercises  2x10 row with arms in W position 1# with slight FWD lean, then lateral lifts      Neck Exercises: Supine   Cervical Isometrics  Extension;20 reps;3 secs    Cervical Rotation   Both;10 reps      Modalities   Modalities  Moist Heat      Moist Heat Therapy   Number Minutes Moist Heat  15 Minutes    Moist Heat Location  Cervical      Manual Therapy   Manual Therapy  Joint mobilization;Soft tissue mobilization;Manual Traction    Joint Mobilization  cervical rotational grade II - pt hypomobile at C3/4 with tenderness    Soft tissue mobilization  STM to bilat cervical paraspinals.     Manual Traction  gentle cervical      Neck Exercises: Stretches   Upper Trapezius Stretch  Left;Right;30 seconds;3 reps    Other Neck Stretches  doorway low and mid x 30 sec each, VC for form                  PT Long Term Goals - 08/18/18 1006      PT LONG TERM GOAL #1   Title  I with advanced HEP ( 09/08/2018)     Baseline  needs min cues     Time  6    Period  Weeks    Status  On-going      PT LONG TERM GOAL #2   Title  improve Rt cervical rotation =/> 75 degrees to assist with looking over her shoulder ( 09/08/2018)     Baseline  70right 72 left     Time  6    Period  Weeks    Status  On-going      PT LONG TERM GOAL #3   Title  improve upper back strength =/> 5-/5 to allow her to play violin with no more than 2/10 pain ( 09/08/2018)     Time  6    Period  Weeks    Status  Unable to assess      PT LONG TERM GOAL #4   Title  report =/> 75% reduction in feelings of tightness/pain in the Rt upper shoulder/neck ( 09/08/2018)     Baseline  60% reduction    Time  6    Period  Weeks    Status  On-going      PT LONG TERM GOAL #5   Title  improve FOTO =/< 37% limited ( 09/08/2018)     Time  6    Period  Weeks    Status  On-going            Plan - 08/27/18 1110    Clinical Impression Statement  Jonnie is feeling better today than yesterday.  She does continue with postural changes that place extra stress on her neck.  She is tolerating cervical stability exercise well.     Rehab Potential  Excellent    PT Frequency  2x / week    PT Duration  6 weeks     PT Next Visit Plan  (Having AAA surgery on 09/09/2018? progress HEP, continue thoracic mobility and scap stab, manual prn, add open books to HEP     PT Home Exercise Plan  upper trap/levator scap stretch, doorway stretch, scap retractions, shoulder rolls, yellow band supine horizontals and ER     Consulted and Agree with Plan of Care  Patient       Patient will benefit from skilled therapeutic intervention in order to improve the following deficits and impairments:  Pain, Postural dysfunction, Increased muscle spasms, Decreased range of motion, Decreased strength, Impaired UE functional use  Visit Diagnosis: Cervicalgia  Muscle weakness (generalized)  Other symptoms and signs involving the musculoskeletal system     Problem List Patient Active Problem List   Diagnosis Date Noted  . Osteoarthritis cervical spine 04/14/2018  . 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 Lipodermatosclerosis 05/10/2015  . Health care maintenance 06/08/2014  . Hypothyroidism 06/01/2014  . Hypertension 06/01/2014  . Hepatic steatosis 08/06/2013  . Diabetic neuropathy ([place]) 05/08/2013  . History of kidney transplant 05/08/2013  . Hyperlipidemia associated with type 2 diabetes mellitus ([place]) 07/30/2011  . AAA (abdominal aortic aneurysm) without rupture ([place]) 07/09/2011  . 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 Patient is being discharged due to not returning since the last visit.  ?????     Jeral Pinch, PT 02/17/19 11:43 AM

## 2018-09-04 ENCOUNTER — Ambulatory Visit: Payer: Medicare Other | Admitting: Physical Therapy

## 2018-09-05 ENCOUNTER — Ambulatory Visit: Payer: Medicare Other | Admitting: Physical Therapy

## 2018-09-08 ENCOUNTER — Other Ambulatory Visit: Payer: Self-pay | Admitting: Student in an Organized Health Care Education/Training Program

## 2018-09-09 DIAGNOSIS — Z940 Kidney transplant status: Secondary | ICD-10-CM | POA: Diagnosis not present

## 2018-09-09 DIAGNOSIS — E119 Type 2 diabetes mellitus without complications: Secondary | ICD-10-CM | POA: Diagnosis present

## 2018-09-09 DIAGNOSIS — Z86718 Personal history of other venous thrombosis and embolism: Secondary | ICD-10-CM | POA: Diagnosis not present

## 2018-09-09 DIAGNOSIS — Z794 Long term (current) use of insulin: Secondary | ICD-10-CM | POA: Diagnosis not present

## 2018-09-09 DIAGNOSIS — Z79890 Hormone replacement therapy (postmenopausal): Secondary | ICD-10-CM | POA: Diagnosis not present

## 2018-09-09 DIAGNOSIS — Z8679 Personal history of other diseases of the circulatory system: Secondary | ICD-10-CM | POA: Diagnosis not present

## 2018-09-09 DIAGNOSIS — Z7952 Long term (current) use of systemic steroids: Secondary | ICD-10-CM | POA: Diagnosis not present

## 2018-09-09 DIAGNOSIS — Z87891 Personal history of nicotine dependence: Secondary | ICD-10-CM | POA: Diagnosis not present

## 2018-09-09 DIAGNOSIS — K219 Gastro-esophageal reflux disease without esophagitis: Secondary | ICD-10-CM | POA: Diagnosis present

## 2018-09-09 DIAGNOSIS — E079 Disorder of thyroid, unspecified: Secondary | ICD-10-CM | POA: Diagnosis present

## 2018-09-09 DIAGNOSIS — Z79899 Other long term (current) drug therapy: Secondary | ICD-10-CM | POA: Diagnosis not present

## 2018-09-09 DIAGNOSIS — L309 Dermatitis, unspecified: Secondary | ICD-10-CM | POA: Diagnosis present

## 2018-09-09 DIAGNOSIS — I10 Essential (primary) hypertension: Secondary | ICD-10-CM | POA: Diagnosis present

## 2018-09-09 DIAGNOSIS — T82330A Leakage of aortic (bifurcation) graft (replacement), initial encounter: Secondary | ICD-10-CM | POA: Diagnosis present

## 2018-09-09 DIAGNOSIS — N289 Disorder of kidney and ureter, unspecified: Secondary | ICD-10-CM | POA: Diagnosis present

## 2018-09-09 DIAGNOSIS — Z7982 Long term (current) use of aspirin: Secondary | ICD-10-CM | POA: Diagnosis not present

## 2018-09-09 DIAGNOSIS — Z95828 Presence of other vascular implants and grafts: Secondary | ICD-10-CM | POA: Diagnosis not present

## 2018-09-09 DIAGNOSIS — Z8349 Family history of other endocrine, nutritional and metabolic diseases: Secondary | ICD-10-CM | POA: Diagnosis not present

## 2018-09-09 DIAGNOSIS — T82330S Leakage of aortic (bifurcation) graft (replacement), sequela: Secondary | ICD-10-CM | POA: Diagnosis not present

## 2018-09-09 DIAGNOSIS — E785 Hyperlipidemia, unspecified: Secondary | ICD-10-CM | POA: Diagnosis present

## 2018-09-10 DIAGNOSIS — T82330A Leakage of aortic (bifurcation) graft (replacement), initial encounter: Secondary | ICD-10-CM | POA: Diagnosis not present

## 2018-09-10 DIAGNOSIS — E785 Hyperlipidemia, unspecified: Secondary | ICD-10-CM | POA: Diagnosis not present

## 2018-09-10 DIAGNOSIS — E119 Type 2 diabetes mellitus without complications: Secondary | ICD-10-CM | POA: Diagnosis not present

## 2018-09-10 DIAGNOSIS — Z940 Kidney transplant status: Secondary | ICD-10-CM | POA: Diagnosis not present

## 2018-09-10 DIAGNOSIS — E079 Disorder of thyroid, unspecified: Secondary | ICD-10-CM | POA: Diagnosis not present

## 2018-09-10 DIAGNOSIS — N289 Disorder of kidney and ureter, unspecified: Secondary | ICD-10-CM | POA: Diagnosis not present

## 2018-12-03 DIAGNOSIS — N261 Atrophy of kidney (terminal): Secondary | ICD-10-CM | POA: Diagnosis not present

## 2018-12-03 DIAGNOSIS — Z9889 Other specified postprocedural states: Secondary | ICD-10-CM | POA: Diagnosis not present

## 2018-12-03 DIAGNOSIS — Z8679 Personal history of other diseases of the circulatory system: Secondary | ICD-10-CM | POA: Diagnosis not present

## 2018-12-03 DIAGNOSIS — N289 Disorder of kidney and ureter, unspecified: Secondary | ICD-10-CM | POA: Diagnosis not present

## 2018-12-03 DIAGNOSIS — I714 Abdominal aortic aneurysm, without rupture: Secondary | ICD-10-CM | POA: Diagnosis not present

## 2018-12-03 DIAGNOSIS — T82330S Leakage of aortic (bifurcation) graft (replacement), sequela: Secondary | ICD-10-CM | POA: Diagnosis not present

## 2018-12-03 DIAGNOSIS — I723 Aneurysm of iliac artery: Secondary | ICD-10-CM | POA: Diagnosis not present

## 2018-12-04 ENCOUNTER — Encounter: Payer: Self-pay | Admitting: *Deleted

## 2018-12-05 ENCOUNTER — Ambulatory Visit (INDEPENDENT_AMBULATORY_CARE_PROVIDER_SITE_OTHER): Payer: Medicare Other | Admitting: Internal Medicine

## 2018-12-05 ENCOUNTER — Other Ambulatory Visit: Payer: Self-pay

## 2018-12-05 VITALS — BP 131/81 | HR 80 | Temp 99.7°F | Ht 67.0 in | Wt 212.2 lb

## 2018-12-05 DIAGNOSIS — E1143 Type 2 diabetes mellitus with diabetic autonomic (poly)neuropathy: Secondary | ICD-10-CM | POA: Diagnosis not present

## 2018-12-05 DIAGNOSIS — I10 Essential (primary) hypertension: Secondary | ICD-10-CM

## 2018-12-05 DIAGNOSIS — H900 Conductive hearing loss, bilateral: Secondary | ICD-10-CM | POA: Diagnosis not present

## 2018-12-05 DIAGNOSIS — Z87891 Personal history of nicotine dependence: Secondary | ICD-10-CM

## 2018-12-05 DIAGNOSIS — Z794 Long term (current) use of insulin: Secondary | ICD-10-CM

## 2018-12-05 DIAGNOSIS — H9012 Conductive hearing loss, unilateral, left ear, with unrestricted hearing on the contralateral side: Secondary | ICD-10-CM

## 2018-12-05 LAB — POCT GLYCOSYLATED HEMOGLOBIN (HGB A1C): Hemoglobin A1C: 6.7 % — AB (ref 4.0–5.6)

## 2018-12-05 MED ORDER — LANTUS SOLOSTAR 100 UNIT/ML ~~LOC~~ SOPN: PEN_INJECTOR | SUBCUTANEOUS | 5 refills | Status: DC

## 2018-12-05 NOTE — Progress Notes (Signed)
   CC: hearing loss   HPI:  [name] is a 73 y.o. F with insulin dependent type 2 DM, HTN presenting for evaluation of hearing loss and diabetes f/u.   Please see encounter tab for full details of HPI    Past Medical History:  Diagnosis Date  . AAA (abdominal aortic aneurysm) (HCC)    3.8 cm Infrarenal managed at [facility]  . Clotting disorder ([place])    PE, IVC filter placed at [facility]  . Diabetes mellitus   . Hypertension   . Hypothyroidism   . Morbid obesity (HCC)    BMI 34  . Renal transplant recipient    [facility]    Physical Exam:  Vitals:   12/05/18 0931  BP: 131/81  Pulse: 80  Temp: 99.7 F (37.6 C)  TempSrc: Oral  SpO2: 99%  Weight: 212 lb 3.2 oz (96.3 kg)  Height: 5\' 7"  (1.702 m)   Gen: well appearing, NAD HENT: TMs intact bilaterally with good light reflex, minimal amount of cerumen. Air conduction is greater than bone bilaterally. Weber localizes to left ear  Cardiac: RRR Pulm: CTAB  Assessment & Plan:   See Encounters Tab for problem based charting.  Patient discussed with Dr. Daryll Drown

## 2018-12-05 NOTE — Patient Instructions (Addendum)
It was nice seeing you today. Thank you for choosing [facility] for your Primary Care.   Today we talked about:  1) Hearing loss: I think you may need hearing aids. I have referred you to ENT for formal hearing tests. You can also try sinus irrigation 1-2 times a day for the next week and see if that makes a difference. If you notice worsening hearing loss of deafness please come back and see [place].    FOLLOW-UP INSTRUCTIONS When: next available with PCP For: diabetes What to bring: all medications   Please contact the clinic if you have any problems, or need to be seen sooner.   BUFFERED ISOTONIC SALINE NASAL IRRIGATION  The Benefits:  1. When you irrigate, the isotonic saline (salt water) acts as a solvent and washes the mucus crusts and other debris from your nose.  2. This decongests and improves the airflow into your nose. The sinus passages begin to open.  3. Studies have also shown that a salt water and an alkaline (baking soda) irrigation solution improves nasal membrane cell function (mucociliary flow of mucus debris).  The Recipe:  1. Choose a 1-quart glass jar that is thoroughly cleansed.  2. Fill with sterile or distilled water, or you can boil water from the tap.  3. Add 1 to 2 heaping teaspoons of "pickling/canning/sea" salt (NOT table salt as it contains a large number of additives). This salt is available at the grocery store in the food canning section.  4. Add 1 teaspoon of Arm & Hammer Baking Soda (pure bicarbonate).  5. Mix ingredients together and store at room temperature. Discard after one week. If you find this solution too strong, you may decrease the amount of salt added to 1 to 1  teaspoons. With children it is often best to start with a milder solution and advance slowly. Irrigate with 240 ml (8 oz) twice daily.  The Instructions:  You should plan to irrigate your nose with buffered isotonic saline 2 times per day. Many people prefer to warm  the solution slightly in the microwave - but be sure that the solution is NOT HOT. Stand over the sink (some do this in the shower) and squirt the solution into each side of your nose, keeping your mouth open. This allows you to spit the saltwater out of your mouth. It will not harm you if you swallow a little.  If you have been told to use a nasal steroid such as Flonase, Nasonex, or Nasacort, you should always use isortonic saline solution first, then use your nasal steroid product. The nasal steroid is much more effective when sprayed onto clean nasal membranes and the steroid medicine will reach deeper into the nose.  Most people experience a little burning sensation the first few times they use a isotonic saline solution, but this usually goes away within a few days.

## 2018-12-08 DIAGNOSIS — H902 Conductive hearing loss, unspecified: Secondary | ICD-10-CM | POA: Insufficient documentation

## 2018-12-08 LAB — GLUCOSE, CAPILLARY: Glucose-Capillary: 69 mg/dL — ABNORMAL LOW (ref 70–99)

## 2018-12-08 NOTE — Assessment & Plan Note (Signed)
Did not bring glucometer today. She checks her sugar prior to breakfast and sometimes again at bedtime and reports CBGs range 78-140. Denies symptomatic hypoglycemia. Reports compliance with lantus 35U nightly. She has not seen her retina specialist this year.   - a1c 6.7, at goal  - continue lantus 35U qhs, requesting refill today - encouraged her to schedule appt for diabetic eye exam

## 2018-12-08 NOTE — Assessment & Plan Note (Signed)
Describes gradual hearing loss over the last 4 months, worse in the left ear. Denies sudden onset and denies complete hearing loss. Denies tinnitus, ear pain or trauma. Has tried hydrogen peroxide and q-tips which helped at first but not anymore. 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 She also describes ear fullness and puff eyes. Allergies could be causing some degree of eustachian tube dysfunction   - refer to ENT for audiology testing and hearing aids - recommend daily sinus irrigation followed by daily flonase

## 2018-12-09 NOTE — Progress Notes (Signed)
Internal Medicine Clinic Attending  Case discussed with Dr. Vogel  at the time of the visit.  We reviewed the resident's history and exam and pertinent patient test results.  I agree with the assessment, diagnosis, and plan of care documented in the resident's note.  

## 2018-12-09 NOTE — Addendum Note (Signed)
Addended by: Gilles Chiquito B on: 12/09/2018 11:18 AM   Modules accepted: Level of Service

## 2018-12-22 ENCOUNTER — Encounter: Payer: Medicare Other | Admitting: Student in an Organized Health Care Education/Training Program

## 2019-01-05 ENCOUNTER — Encounter: Payer: Medicare Other | Admitting: Student in an Organized Health Care Education/Training Program

## 2019-01-14 ENCOUNTER — Telehealth: Payer: Self-pay | Admitting: *Deleted

## 2019-01-14 DIAGNOSIS — E119 Type 2 diabetes mellitus without complications: Secondary | ICD-10-CM | POA: Diagnosis not present

## 2019-01-14 DIAGNOSIS — I8310 Varicose veins of unspecified lower extremity with inflammation: Secondary | ICD-10-CM | POA: Diagnosis not present

## 2019-01-14 DIAGNOSIS — Z86718 Personal history of other venous thrombosis and embolism: Secondary | ICD-10-CM | POA: Diagnosis not present

## 2019-01-14 DIAGNOSIS — D649 Anemia, unspecified: Secondary | ICD-10-CM | POA: Diagnosis not present

## 2019-01-14 DIAGNOSIS — Z792 Long term (current) use of antibiotics: Secondary | ICD-10-CM | POA: Diagnosis not present

## 2019-01-14 DIAGNOSIS — Z7951 Long term (current) use of inhaled steroids: Secondary | ICD-10-CM | POA: Diagnosis not present

## 2019-01-14 DIAGNOSIS — E039 Hypothyroidism, unspecified: Secondary | ICD-10-CM | POA: Diagnosis not present

## 2019-01-14 DIAGNOSIS — Z9889 Other specified postprocedural states: Secondary | ICD-10-CM | POA: Diagnosis not present

## 2019-01-14 DIAGNOSIS — Z940 Kidney transplant status: Secondary | ICD-10-CM | POA: Diagnosis not present

## 2019-01-14 DIAGNOSIS — N183 Chronic kidney disease, stage 3 (moderate): Secondary | ICD-10-CM | POA: Diagnosis not present

## 2019-01-14 DIAGNOSIS — I714 Abdominal aortic aneurysm, without rupture: Secondary | ICD-10-CM | POA: Diagnosis not present

## 2019-01-14 DIAGNOSIS — K219 Gastro-esophageal reflux disease without esophagitis: Secondary | ICD-10-CM | POA: Diagnosis not present

## 2019-01-14 DIAGNOSIS — Z794 Long term (current) use of insulin: Secondary | ICD-10-CM | POA: Diagnosis not present

## 2019-01-14 DIAGNOSIS — Z4822 Encounter for aftercare following kidney transplant: Secondary | ICD-10-CM | POA: Diagnosis not present

## 2019-01-14 DIAGNOSIS — I129 Hypertensive chronic kidney disease with stage 1 through stage 4 chronic kidney disease, or unspecified chronic kidney disease: Secondary | ICD-10-CM | POA: Diagnosis not present

## 2019-01-14 DIAGNOSIS — I10 Essential (primary) hypertension: Secondary | ICD-10-CM | POA: Diagnosis not present

## 2019-01-14 DIAGNOSIS — Z79899 Other long term (current) drug therapy: Secondary | ICD-10-CM | POA: Diagnosis not present

## 2019-01-14 DIAGNOSIS — E876 Hypokalemia: Secondary | ICD-10-CM | POA: Diagnosis not present

## 2019-01-14 NOTE — Telephone Encounter (Signed)
LVM FOR PATIENT TO RETURN CALL TO CLINIC. PATIENT NO SHOWED FOR HER APPOINTMENT. PATIENT NEEDS TO CALL  DR BATES OFFICE TO R/S APPOINTMENT @ [phone]. OR LET us KNOW IF SHE IS WANTING [place] TO CANCEL THIS REFERRAL.

## 2019-01-15 ENCOUNTER — Telehealth: Payer: Self-pay | Admitting: Student in an Organized Health Care Education/Training Program

## 2019-01-15 DIAGNOSIS — M21619 Bunion of unspecified foot: Secondary | ICD-10-CM

## 2019-01-15 NOTE — Telephone Encounter (Signed)
[place]. Referral placed. Patient also has DM so will be good to get in with podiatry for feet care.

## 2019-01-15 NOTE — Telephone Encounter (Signed)
-----   Message from [place] sent at 01/15/2019 10:44 AM EDT ----- Regarding: PODIATRY REFERRAL DR Evette Doffing, PATIENT CALLED REQUESTING FOOT REFERRAL FOR [facility]. THANKS LELA

## 2019-01-15 NOTE — Addendum Note (Signed)
Addended by: Hulan Fray on: 01/15/2019 10:49 AM   Modules accepted: Orders

## 2019-01-22 ENCOUNTER — Ambulatory Visit: Payer: Medicare Other | Admitting: Podiatry

## 2019-01-26 DIAGNOSIS — Z940 Kidney transplant status: Secondary | ICD-10-CM | POA: Diagnosis not present

## 2019-01-26 DIAGNOSIS — Z79899 Other long term (current) drug therapy: Secondary | ICD-10-CM | POA: Diagnosis not present

## 2019-01-26 DIAGNOSIS — N183 Chronic kidney disease, stage 3 (moderate): Secondary | ICD-10-CM | POA: Diagnosis not present

## 2019-01-26 DIAGNOSIS — D631 Anemia in chronic kidney disease: Secondary | ICD-10-CM | POA: Diagnosis not present

## 2019-01-26 DIAGNOSIS — I10 Essential (primary) hypertension: Secondary | ICD-10-CM | POA: Diagnosis not present

## 2019-01-29 ENCOUNTER — Ambulatory Visit (INDEPENDENT_AMBULATORY_CARE_PROVIDER_SITE_OTHER): Payer: Medicare Other | Admitting: Podiatry

## 2019-01-29 ENCOUNTER — Ambulatory Visit (INDEPENDENT_AMBULATORY_CARE_PROVIDER_SITE_OTHER): Payer: Medicare Other

## 2019-01-29 ENCOUNTER — Other Ambulatory Visit: Payer: Self-pay | Admitting: Podiatry

## 2019-01-29 ENCOUNTER — Encounter: Payer: Self-pay | Admitting: Podiatry

## 2019-01-29 ENCOUNTER — Other Ambulatory Visit: Payer: Self-pay

## 2019-01-29 VITALS — BP 161/88 | HR 96 | Temp 97.8°F

## 2019-01-29 DIAGNOSIS — M21611 Bunion of right foot: Secondary | ICD-10-CM | POA: Diagnosis not present

## 2019-01-29 DIAGNOSIS — M21619 Bunion of unspecified foot: Secondary | ICD-10-CM

## 2019-01-29 DIAGNOSIS — L600 Ingrowing nail: Secondary | ICD-10-CM

## 2019-01-29 DIAGNOSIS — M21612 Bunion of left foot: Secondary | ICD-10-CM

## 2019-01-29 DIAGNOSIS — M2042 Other hammer toe(s) (acquired), left foot: Secondary | ICD-10-CM

## 2019-01-29 DIAGNOSIS — M2041 Other hammer toe(s) (acquired), right foot: Secondary | ICD-10-CM

## 2019-01-29 NOTE — Patient Instructions (Addendum)

## 2019-02-03 NOTE — Progress Notes (Signed)
Subjective:   Patient ID: [name], female   DOB: 73 y.o.   MRN: [id number]   HPI Patient states the right foot have been having trouble with the toenail and is also concerned about the position of the toe and whether he might have bone deformity.  States the nails been bothering her for few months and she tried to trim it and soak it and patient does not currently smoke and likes to be active   Review of Systems  All other systems reviewed and are negative.       Objective:  Physical Exam Vitals signs and nursing note reviewed.  Constitutional:      Appearance: She is well-developed.  Pulmonary:     Effort: Pulmonary effort is normal.  Musculoskeletal: Normal range of motion.  Skin:    General: Skin is warm.  Neurological:     Mental Status: She is alert.     Neurovascular status was found to be intact muscle strength was found to be adequate with patient found to have moderate deviation of the right big toe against the second toe with incurvated nail bed right hallux medial border that is painful when pressed with no redness or drainage noted.  Patient is found to have good digital perfusion well oriented x3     Assessment:  Ingrown toenail deformity right hallux with pain along with structural deformity of a moderate nature     Plan:  H&P conditions reviewed and today I recommended correction the nail explained procedure risk and she signed consent form.  I infiltrated the right hallux 60 mg like Marcaine mixture under sterile conditions I remove the medial border exposed matrix and applied phenol 3 applications 30 seconds followed by alcohol lavage sterile dressing.  Gave instructions on soaks and reappoint  X-ray indicates moderate structural deformity with no indications of other pathology

## 2019-02-11 DIAGNOSIS — H353134 Nonexudative age-related macular degeneration, bilateral, advanced atrophic with subfoveal involvement: Secondary | ICD-10-CM | POA: Diagnosis not present

## 2019-02-12 ENCOUNTER — Telehealth: Payer: Self-pay | Admitting: Pharmacist

## 2019-02-12 NOTE — Telephone Encounter (Signed)
Diabetes Management Follow Up [name] is a 73 y.o. female who was contacted for DM management. Identity was verified using date of birth.  Diabetes medications: Lantus 35 units daily Patient correctly reports DM regimen and adherence  Since pt is 72, and BG is pretty well controlled, ideally, we would like to decrease her insulin. She is not a candidate for an SGLT2i or metformin given her GFR but a GLP1 agonist seems reasonable in this patient. Dr. Evette Doffing approved insulin dose reduction if pt agrees to iniaite a GLP1. A/P Blood glucose control: stable  Interventions today: - Discussed diabetes medications with the patient today and the reason why we are interested in decreasing her insulin given her age and risk of hypoglycemia. Pt verbalized understanding and even asked if it was reasonable for her to be able to d/c insulin altogether. I told her that it depended on her BG control and that we would decrease her insulin by ~50% when starting the GLP1 and can continue to f/u with her to adjust her insulin requirements as needed  Advised to contact clinic or seek medical attention if concerns or symptoms arise  Will notify PCP of findings  The patient verbalized understanding of information provided by repeating back concepts discussed.  Follow-up Pt will f/u with pharmacist in clinic to initiate GLP1 agonist and decrease insulin   Kennon Holter, PharmD PGY1 Ambulatory Care Pharmacy Resident [employer] Phone: [phone]

## 2019-02-12 NOTE — Telephone Encounter (Signed)
Great.  Thank you for your help.

## 2019-02-20 ENCOUNTER — Telehealth: Payer: Self-pay | Admitting: Pharmacist

## 2019-02-20 NOTE — Telephone Encounter (Signed)
LVM as a visit reminder for 8/31 @ 0900

## 2019-02-23 ENCOUNTER — Ambulatory Visit: Payer: Medicare Other | Admitting: Pharmacist

## 2019-05-15 ENCOUNTER — Other Ambulatory Visit: Payer: Self-pay | Admitting: *Deleted

## 2019-05-15 MED ORDER — LEVOTHYROXINE SODIUM 100 MCG PO TABS: 100.0000 ug | ORAL_TABLET | Freq: Every day | ORAL | 3 refills | Status: DC

## 2019-05-15 NOTE — Telephone Encounter (Signed)
Received call from Judson Roch, Pharmacist at [facility] requesting refill on levothyroxine 100 mcg. States patient has 3 days left. Patient last seen in [facility] 12/05/2018 but last OV with PCP was 04/14/2018. Judson Roch will notify patient to call [facility] to schedule OV. Will also route to [employer] to assist patient in scheduling first available appt with PCP. Hubbard Hartshorn, BSN, RN-BC

## 2019-05-29 ENCOUNTER — Telehealth: Payer: Self-pay | Admitting: Dietician

## 2019-05-29 NOTE — Telephone Encounter (Signed)
Ms Kesselman calls and leaves a message that she is supposed to call me about "a meter she is supposed to receive that has patches that is  in place of Lantus shots she is supposed to be taking every night- 35 mg.  She says someone named Estill Bamberg called her saying Dr. Evette Doffing signed an order and it is ready to be shipped.  Estill Bamberg told her she would call her every two weeks about using the patches. Ms Fotopoulos says on her message she is not sure she wants to be bothered with them. " Spoke with Ms Goga, who states she does not want to use this and asks if she has to. I informed her she does not have to use the Freestyle libre CGM and can continue to check her blood sugar the way she is now and should contiue her insulin as ordered by Dr. Evette Doffing. She agreed to try to find Amanda's phone number to call her and tell her not to ship it. She appreciated that we would fax Estill Bamberg to cancel the order for the Freestyle Libre CGM.  Updated her address per her request.

## 2019-06-03 DIAGNOSIS — Z9889 Other specified postprocedural states: Secondary | ICD-10-CM | POA: Diagnosis not present

## 2019-06-03 DIAGNOSIS — I714 Abdominal aortic aneurysm, without rupture: Secondary | ICD-10-CM | POA: Diagnosis not present

## 2019-06-03 DIAGNOSIS — I723 Aneurysm of iliac artery: Secondary | ICD-10-CM | POA: Diagnosis not present

## 2019-06-03 DIAGNOSIS — Z8679 Personal history of other diseases of the circulatory system: Secondary | ICD-10-CM | POA: Diagnosis not present

## 2019-06-05 DIAGNOSIS — H353134 Nonexudative age-related macular degeneration, bilateral, advanced atrophic with subfoveal involvement: Secondary | ICD-10-CM | POA: Diagnosis not present

## 2019-06-05 DIAGNOSIS — H35033 Hypertensive retinopathy, bilateral: Secondary | ICD-10-CM | POA: Diagnosis not present

## 2019-06-05 DIAGNOSIS — H43822 Vitreomacular adhesion, left eye: Secondary | ICD-10-CM | POA: Diagnosis not present

## 2019-06-05 DIAGNOSIS — H43811 Vitreous degeneration, right eye: Secondary | ICD-10-CM | POA: Diagnosis not present

## 2019-07-07 ENCOUNTER — Encounter: Payer: Self-pay | Admitting: *Deleted

## 2019-07-08 DIAGNOSIS — I714 Abdominal aortic aneurysm, without rupture: Secondary | ICD-10-CM | POA: Diagnosis not present

## 2019-07-08 DIAGNOSIS — I10 Essential (primary) hypertension: Secondary | ICD-10-CM | POA: Diagnosis not present

## 2019-07-08 DIAGNOSIS — E871 Hypo-osmolality and hyponatremia: Secondary | ICD-10-CM | POA: Diagnosis not present

## 2019-07-08 DIAGNOSIS — I879 Disorder of vein, unspecified: Secondary | ICD-10-CM | POA: Diagnosis not present

## 2019-07-08 DIAGNOSIS — Z79899 Other long term (current) drug therapy: Secondary | ICD-10-CM | POA: Diagnosis not present

## 2019-07-08 DIAGNOSIS — Z7952 Long term (current) use of systemic steroids: Secondary | ICD-10-CM | POA: Diagnosis not present

## 2019-07-08 DIAGNOSIS — D8989 Other specified disorders involving the immune mechanism, not elsewhere classified: Secondary | ICD-10-CM | POA: Diagnosis not present

## 2019-07-08 DIAGNOSIS — Z940 Kidney transplant status: Secondary | ICD-10-CM | POA: Diagnosis not present

## 2019-07-08 DIAGNOSIS — Z4822 Encounter for aftercare following kidney transplant: Secondary | ICD-10-CM | POA: Diagnosis not present

## 2019-07-08 DIAGNOSIS — Z794 Long term (current) use of insulin: Secondary | ICD-10-CM | POA: Diagnosis not present

## 2019-07-08 DIAGNOSIS — D649 Anemia, unspecified: Secondary | ICD-10-CM | POA: Diagnosis not present

## 2019-07-08 DIAGNOSIS — E119 Type 2 diabetes mellitus without complications: Secondary | ICD-10-CM | POA: Diagnosis not present

## 2019-07-08 DIAGNOSIS — Z9889 Other specified postprocedural states: Secondary | ICD-10-CM | POA: Diagnosis not present

## 2019-07-14 ENCOUNTER — Telehealth: Payer: Self-pay | Admitting: *Deleted

## 2019-07-14 ENCOUNTER — Other Ambulatory Visit: Payer: Self-pay | Admitting: *Deleted

## 2019-07-14 DIAGNOSIS — K219 Gastro-esophageal reflux disease without esophagitis: Secondary | ICD-10-CM

## 2019-07-14 MED ORDER — PANTOPRAZOLE SODIUM 40 MG PO TBEC: 40.0000 mg | DELAYED_RELEASE_TABLET | Freq: Every day | ORAL | 3 refills | Status: DC

## 2019-07-14 MED ORDER — FUROSEMIDE 40 MG PO TABS: 40.0000 mg | ORAL_TABLET | Freq: Every day | ORAL | 3 refills | Status: DC

## 2019-07-14 NOTE — Telephone Encounter (Signed)
Furosemide refill sent 

## 2019-07-17 ENCOUNTER — Telehealth: Payer: Self-pay | Admitting: Student in an Organized Health Care Education/Training Program

## 2019-07-17 NOTE — Telephone Encounter (Signed)
Spoke with this patient.  Patient unable to remember who she saw before and is requesting some suggestions. Mailed patient a List of Dentist that accept her Medicare/Medicaid.

## 2019-07-17 NOTE — Telephone Encounter (Signed)
Pt is needing a referral to dentist; pt contact [phone]

## 2019-08-10 ENCOUNTER — Encounter: Payer: Medicare Other | Admitting: Student in an Organized Health Care Education/Training Program

## 2019-08-12 DIAGNOSIS — Z79899 Other long term (current) drug therapy: Secondary | ICD-10-CM | POA: Diagnosis not present

## 2019-08-12 DIAGNOSIS — N1832 Chronic kidney disease, stage 3b: Secondary | ICD-10-CM | POA: Diagnosis not present

## 2019-08-12 DIAGNOSIS — Z7952 Long term (current) use of systemic steroids: Secondary | ICD-10-CM | POA: Diagnosis not present

## 2019-08-12 DIAGNOSIS — Z940 Kidney transplant status: Secondary | ICD-10-CM | POA: Diagnosis not present

## 2019-08-12 DIAGNOSIS — E039 Hypothyroidism, unspecified: Secondary | ICD-10-CM | POA: Diagnosis not present

## 2019-08-12 DIAGNOSIS — N281 Cyst of kidney, acquired: Secondary | ICD-10-CM | POA: Diagnosis not present

## 2019-08-12 DIAGNOSIS — E1369 Other specified diabetes mellitus with other specified complication: Secondary | ICD-10-CM | POA: Diagnosis not present

## 2019-08-12 DIAGNOSIS — I8310 Varicose veins of unspecified lower extremity with inflammation: Secondary | ICD-10-CM | POA: Diagnosis not present

## 2019-08-12 DIAGNOSIS — I723 Aneurysm of iliac artery: Secondary | ICD-10-CM | POA: Diagnosis not present

## 2019-08-12 DIAGNOSIS — Z794 Long term (current) use of insulin: Secondary | ICD-10-CM | POA: Diagnosis not present

## 2019-08-12 DIAGNOSIS — Z792 Long term (current) use of antibiotics: Secondary | ICD-10-CM | POA: Diagnosis not present

## 2019-08-12 DIAGNOSIS — T82898D Other specified complication of vascular prosthetic devices, implants and grafts, subsequent encounter: Secondary | ICD-10-CM | POA: Diagnosis not present

## 2019-08-12 DIAGNOSIS — Z8639 Personal history of other endocrine, nutritional and metabolic disease: Secondary | ICD-10-CM | POA: Diagnosis not present

## 2019-08-12 DIAGNOSIS — Z4822 Encounter for aftercare following kidney transplant: Secondary | ICD-10-CM | POA: Diagnosis not present

## 2019-08-12 DIAGNOSIS — Z9889 Other specified postprocedural states: Secondary | ICD-10-CM | POA: Diagnosis not present

## 2019-08-12 DIAGNOSIS — I129 Hypertensive chronic kidney disease with stage 1 through stage 4 chronic kidney disease, or unspecified chronic kidney disease: Secondary | ICD-10-CM | POA: Diagnosis not present

## 2019-08-12 DIAGNOSIS — Z23 Encounter for immunization: Secondary | ICD-10-CM | POA: Diagnosis not present

## 2019-08-12 DIAGNOSIS — D631 Anemia in chronic kidney disease: Secondary | ICD-10-CM | POA: Diagnosis not present

## 2019-08-12 DIAGNOSIS — Z8719 Personal history of other diseases of the digestive system: Secondary | ICD-10-CM | POA: Diagnosis not present

## 2019-08-12 DIAGNOSIS — I714 Abdominal aortic aneurysm, without rupture: Secondary | ICD-10-CM | POA: Diagnosis not present

## 2019-08-23 ENCOUNTER — Ambulatory Visit: Payer: Medicare Other

## 2019-08-29 ENCOUNTER — Ambulatory Visit: Payer: Medicare Other | Attending: Internal Medicine

## 2019-08-29 DIAGNOSIS — Z23 Encounter for immunization: Secondary | ICD-10-CM | POA: Insufficient documentation

## 2019-08-29 NOTE — Progress Notes (Signed)
   Covid-19 Vaccination Clinic  Name:  [name]    MRN: [id number] DOB: 11/10/1945  08/29/2019  [name] was observed post Covid-19 immunization for 15 minutes without incident. She was provided with Vaccine Information Sheet and instruction to access the V-Safe system.   [name] was instructed to call 911 with any severe reactions post vaccine: Marland Kitchen Difficulty breathing  . Swelling of face and throat  . A fast heartbeat  . A bad rash all over body  . Dizziness and weakness   Immunizations Administered    Name Date Dose VIS Date Route   Pfizer COVID-19 Vaccine 08/29/2019  3:32 PM 0.3 mL 06/05/2019 Intramuscular   Manufacturer: [place]   Lot: [id number]   [place]: [id number]

## 2019-09-25 DIAGNOSIS — Z940 Kidney transplant status: Secondary | ICD-10-CM | POA: Diagnosis not present

## 2019-09-25 DIAGNOSIS — N183 Chronic kidney disease, stage 3 unspecified: Secondary | ICD-10-CM | POA: Diagnosis not present

## 2019-09-25 DIAGNOSIS — I10 Essential (primary) hypertension: Secondary | ICD-10-CM | POA: Diagnosis not present

## 2019-09-25 DIAGNOSIS — Z79899 Other long term (current) drug therapy: Secondary | ICD-10-CM | POA: Diagnosis not present

## 2019-09-25 DIAGNOSIS — D631 Anemia in chronic kidney disease: Secondary | ICD-10-CM | POA: Diagnosis not present

## 2019-09-29 ENCOUNTER — Ambulatory Visit: Payer: Medicare Other | Attending: Internal Medicine

## 2019-09-29 DIAGNOSIS — Z23 Encounter for immunization: Secondary | ICD-10-CM

## 2019-09-29 NOTE — Progress Notes (Signed)
   Covid-19 Vaccination Clinic  Name:  [name]    MRN: [id number] DOB: [birth year]  09/29/2019  [name] was observed post Covid-19 immunization for 30 minutes based on pre-vaccination screening without incident. She was provided with Vaccine Information Sheet and instruction to access the V-Safe system.   [name] was instructed to call 911 with any severe reactions post vaccine: Marland Kitchen Difficulty breathing  . Swelling of face and throat  . A fast heartbeat  . A bad rash all over body  . Dizziness and weakness   Immunizations Administered    Name Date Dose VIS Date Route   Pfizer COVID-19 Vaccine 09/29/2019  8:47 AM 0.3 mL 06/05/2019 Intramuscular   Manufacturer: [place]   Lot: [id number]   [place]: [id number]

## 2019-10-12 ENCOUNTER — Encounter: Payer: Medicare Other | Admitting: Student in an Organized Health Care Education/Training Program

## 2019-10-21 DIAGNOSIS — Z940 Kidney transplant status: Secondary | ICD-10-CM | POA: Diagnosis not present

## 2019-10-21 DIAGNOSIS — I10 Essential (primary) hypertension: Secondary | ICD-10-CM | POA: Diagnosis not present

## 2019-10-21 DIAGNOSIS — N183 Chronic kidney disease, stage 3 unspecified: Secondary | ICD-10-CM | POA: Diagnosis not present

## 2019-10-21 DIAGNOSIS — Z79899 Other long term (current) drug therapy: Secondary | ICD-10-CM | POA: Diagnosis not present

## 2019-10-21 DIAGNOSIS — D631 Anemia in chronic kidney disease: Secondary | ICD-10-CM | POA: Diagnosis not present

## 2019-11-10 ENCOUNTER — Other Ambulatory Visit: Payer: Self-pay

## 2019-11-10 ENCOUNTER — Encounter: Payer: Self-pay | Admitting: Internal Medicine

## 2019-11-10 ENCOUNTER — Ambulatory Visit (INDEPENDENT_AMBULATORY_CARE_PROVIDER_SITE_OTHER): Payer: Medicare Other | Admitting: Internal Medicine

## 2019-11-10 DIAGNOSIS — L989 Disorder of the skin and subcutaneous tissue, unspecified: Secondary | ICD-10-CM | POA: Insufficient documentation

## 2019-11-10 NOTE — Progress Notes (Signed)
   CC: back pimple  HPI:  [name] is a 74 y.o. female with PMHx as listed below presenting with a back pimple for one month duration. She denies any fevers, chills, edema or erythema in surrounding area. Please see problem based charting for further assessment and plan.   Past Medical History:  Diagnosis Date  . AAA (abdominal aortic aneurysm) (HCC)    3.8 cm Infrarenal managed at [facility]  . Clotting disorder ([place])    PE, IVC filter placed at [facility]  . Diabetes mellitus   . Hypertension   . Hypothyroidism   . Morbid obesity (HCC)    BMI 34  . Renal transplant recipient    [facility]   Review of Systems:  Negative except as stated in HPI.   Physical Exam:  Vitals:   11/10/19 0858  BP: (!) 144/86  Pulse: 82  Temp: 98.2 F (36.8 C)  TempSrc: Oral  SpO2: 100%  Weight: 228 lb 12.8 oz (103.8 kg)  Height: 5\' 7"  (1.702 m)   Physical Exam Constitutional:      General: She is not in acute distress.    Appearance: Normal appearance.  Cardiovascular:     Rate and Rhythm: Normal rate and regular rhythm.     Pulses: Normal pulses.     Heart sounds: Normal heart sounds.  Pulmonary:     Effort: Pulmonary effort is normal. No respiratory distress.     Breath sounds: Normal breath sounds. No wheezing, rhonchi or rales.  Musculoskeletal:        General: No swelling or tenderness. Normal range of motion.  Skin:    General: Skin is warm and dry.     Capillary Refill: Capillary refill takes less than 2 seconds.     Coloration: Skin is not jaundiced.     Findings: Lesion present. No bruising, erythema or rash.     Comments: See below   Neurological:     General: No focal deficit present.     Mental Status: She is alert and oriented to person, place, and time. Mental status is at baseline.     Sensory: No sensory deficit.     Motor: No weakness.        Assessment & Plan:   See Encounters Tab for problem based charting.  Patient discussed with Dr.  Rebeca Alert

## 2019-11-10 NOTE — Patient Instructions (Addendum)
[name],  It was a pleasure seeing you in clinic. Today we discussed:   Back pimple: It looks like the bump on your back is healing well. You may continue to bandage it as needed. Please try to avoid scratching or picking at it. You may shower regularly. We will continue to monitor this at your follow up visit with your PCP.  If it becomes more painful or you develop any fevers or chills or redness around the area, please let us know.   Please contact us if you have any questions or concerns.  Thank you!

## 2019-11-10 NOTE — Assessment & Plan Note (Signed)
Patient presented with 1 month history of a small lesion on her right lower back.  She notes that she was recently living in [place] and believes that she might have been bit by something. She notes that she has had some bloody discharge from the area, especially when picking at it.  She notes that this is happened 2-3 times especially when the scab comes off.  Patient believes that there is something inside.  She denies any fevers, chills, pain, erythema, changes in sensation in the area. On examination, 1 to 2 mm lesion in the right lower back that appears to be scabbed over.  No surrounding edema, erythema, tenderness.  On removal of scab, healthy pink granulation tissue underneath without bleeding.  Patient advised to continue dry bandage and avoid itching in the area.  Return precautions provided.  We will continue to monitor.

## 2019-11-13 NOTE — Progress Notes (Signed)
Internal Medicine Clinic Attending  Case discussed with Dr. Aslam at the time of the visit.  We reviewed the resident's history and exam and pertinent patient test results.  I agree with the assessment, diagnosis, and plan of care documented in the resident's note.  Balin Vandegrift, M.D., Ph.D.  

## 2019-11-30 ENCOUNTER — Encounter: Payer: Medicare Other | Admitting: Student in an Organized Health Care Education/Training Program

## 2019-12-02 ENCOUNTER — Other Ambulatory Visit: Payer: Self-pay

## 2019-12-02 DIAGNOSIS — E1143 Type 2 diabetes mellitus with diabetic autonomic (poly)neuropathy: Secondary | ICD-10-CM

## 2019-12-02 MED ORDER — BD PEN NEEDLE NANO U/F 32G X 4 MM MISC: 1 refills | Status: DC

## 2019-12-02 MED ORDER — ACCU-CHEK SOFTCLIX LANCETS MISC: 12 refills | Status: AC

## 2019-12-02 MED ORDER — ACCU-CHEK AVIVA VI STRP: ORAL_STRIP | 5 refills | Status: AC

## 2019-12-02 NOTE — Addendum Note (Signed)
Addended by: Velora Heckler on: 12/02/2019 11:34 AM   Modules accepted: Orders

## 2019-12-02 NOTE — Telephone Encounter (Signed)
Need refill on Insulin Pen Needle (BD PEN NEEDLE NANO U/F) 32G X 4 MM MISC     [facility] [phone] - [place], [address] - [address]

## 2019-12-02 NOTE — Telephone Encounter (Signed)
ACCU-CHEK SOFTCLIX LANCETS lancets  glucose blood (ACCU-CHEK AVIVA) test strip, refill request @  [facility] [phone] - [place], [place] AT [address] [phone] (Phone) [phone] (Fax)

## 2020-01-08 IMAGING — DX DG WRIST COMPLETE 3+V*R*
4 series · 4 of 4 positions shown · non-contrast
Comparison: None.

CLINICAL DATA: Right hand and wrist pain since injury in [REDACTED].

EXAM:
RIGHT HAND - COMPLETE 3+ VIEW; RIGHT WRIST - COMPLETE 3+ VIEW

[wrist ap]
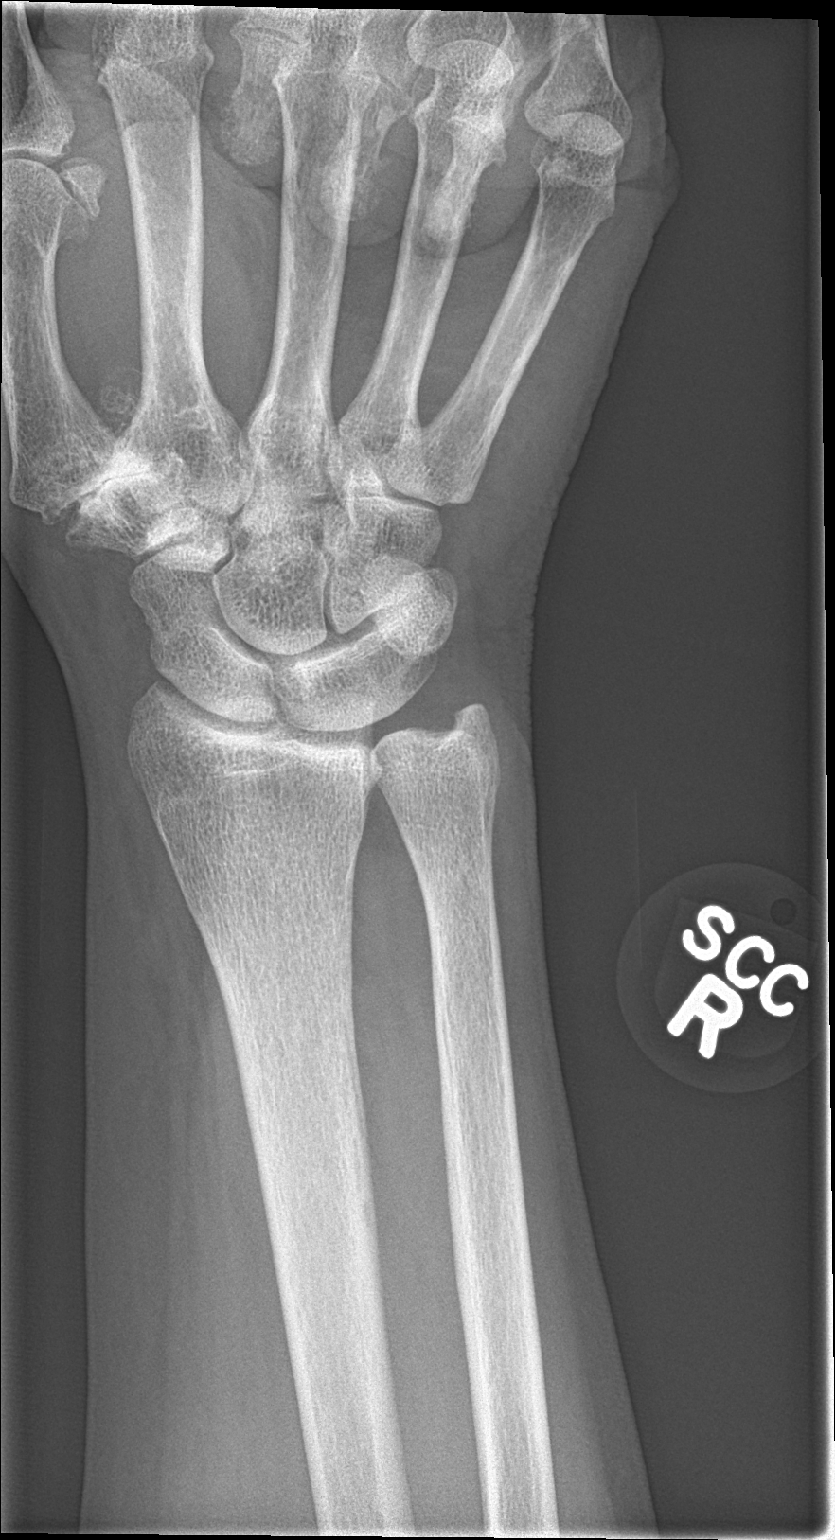

[wrist obl]
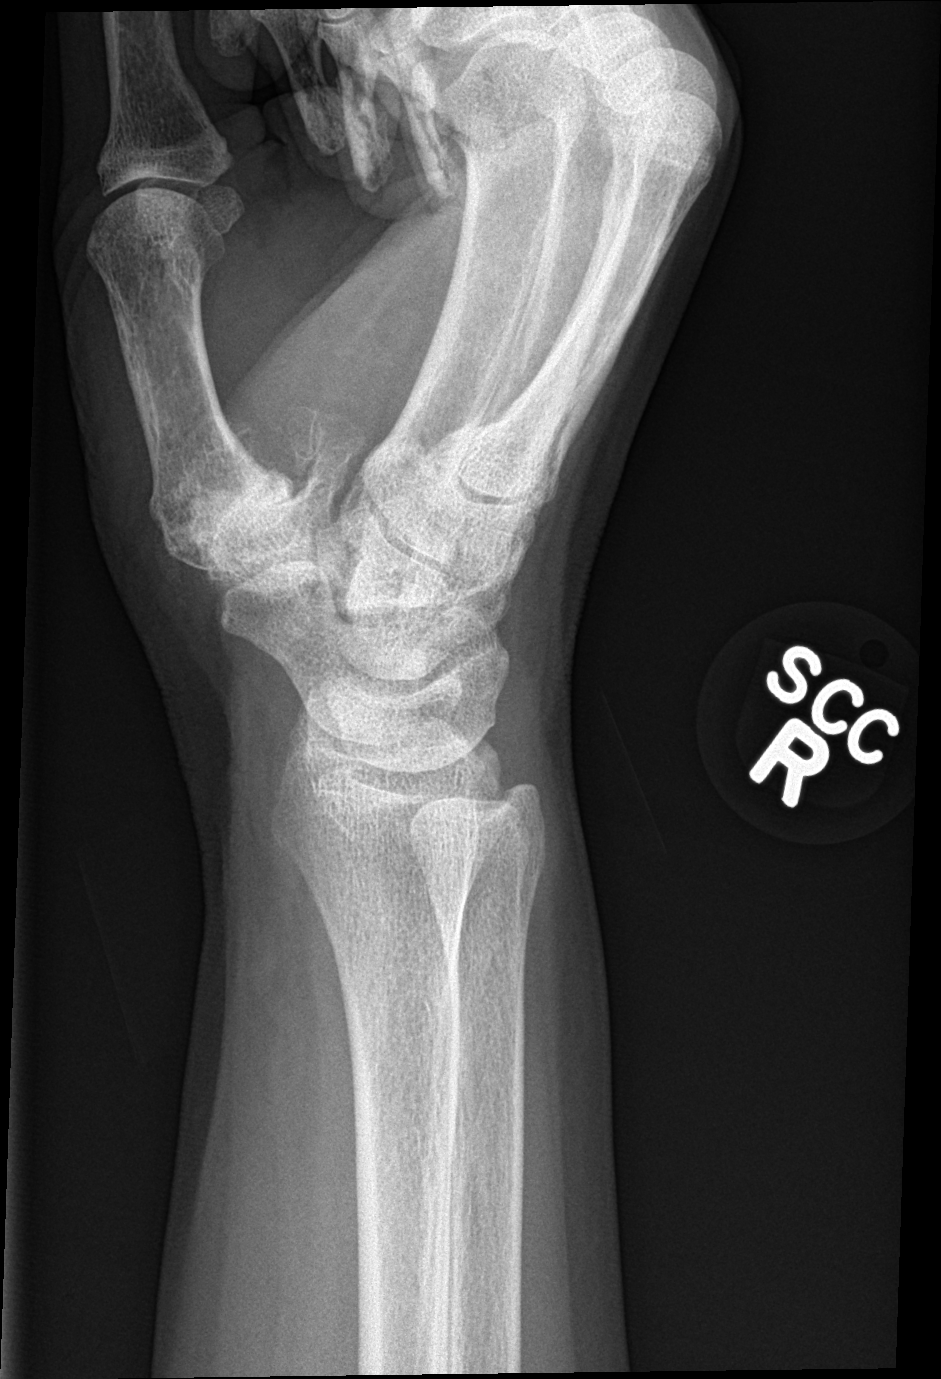

[wrist lat]
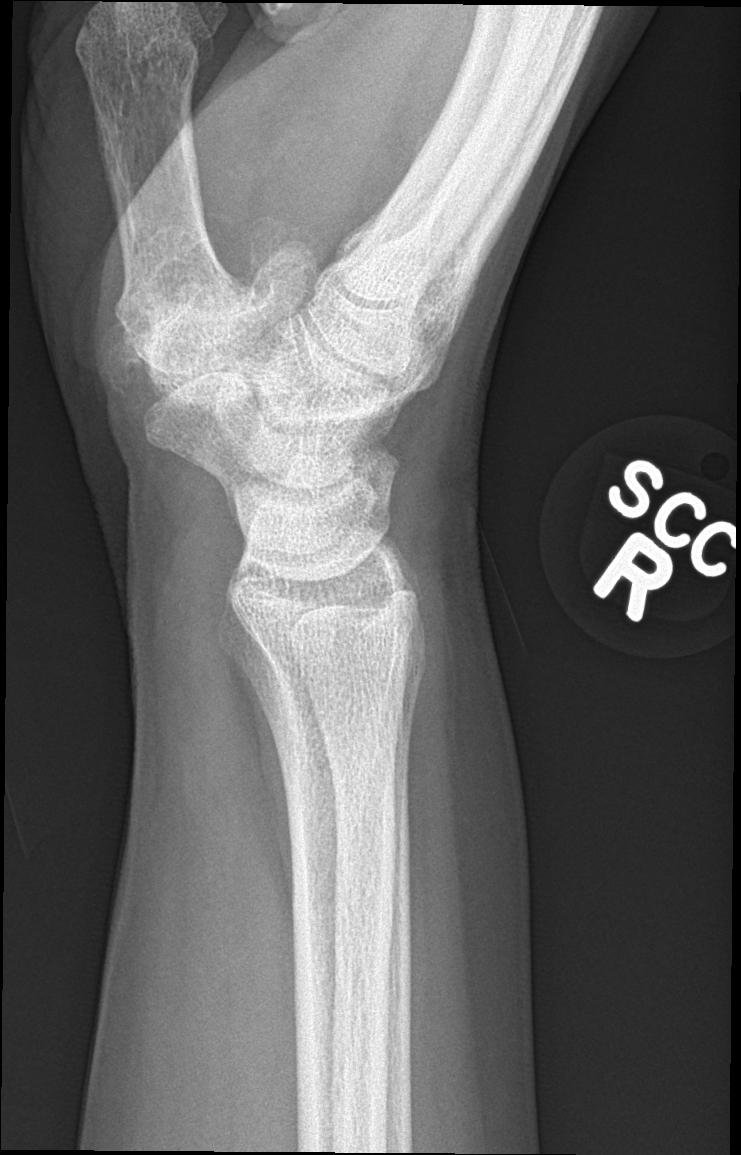

[wrist navicular]
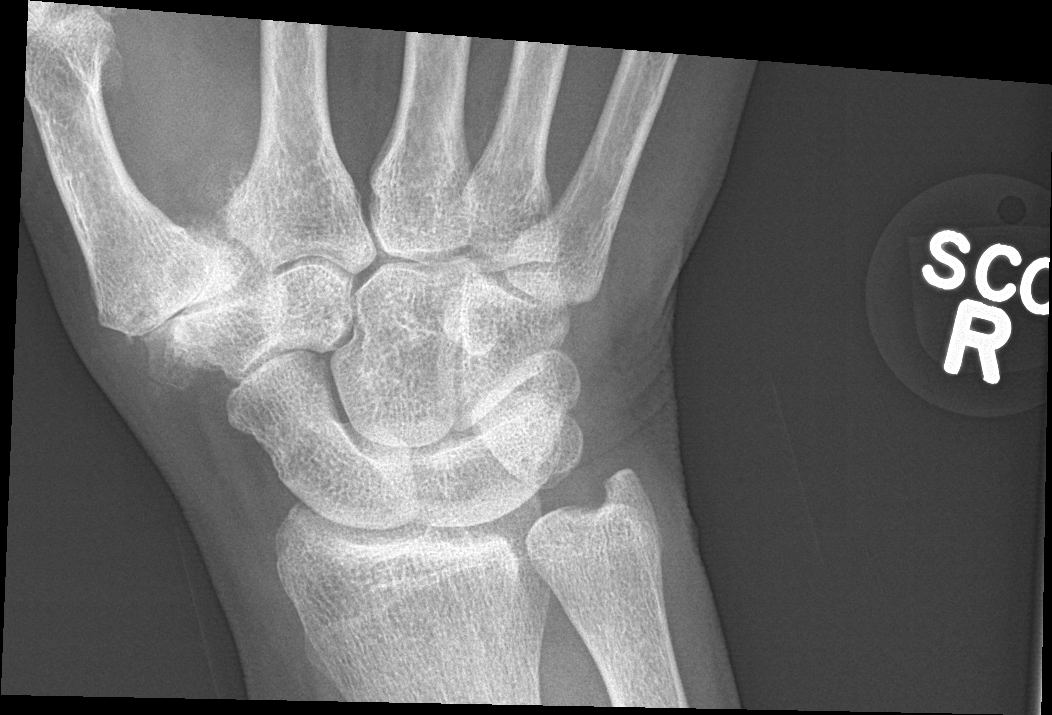

[4 of 4 positions shown; findings below may reference images not displayed]

FINDINGS: There is no evidence of fracture or dislocation. Severe
osteoarthritis of the first CMC joint. Lunotriquetral coalition.
Soft tissues are unremarkable.
IMPRESSION: 1.  No acute osseous abnormality.
2. Severe osteoarthritis of the first CMC joint.

## 2020-01-13 ENCOUNTER — Encounter: Payer: Medicare Other | Admitting: Internal Medicine

## 2020-01-18 ENCOUNTER — Ambulatory Visit (INDEPENDENT_AMBULATORY_CARE_PROVIDER_SITE_OTHER): Payer: Medicare Other | Admitting: Internal Medicine

## 2020-01-18 ENCOUNTER — Encounter: Payer: Self-pay | Admitting: Internal Medicine

## 2020-01-18 ENCOUNTER — Other Ambulatory Visit: Payer: Self-pay

## 2020-01-18 VITALS — BP 132/76 | HR 89 | Temp 98.3°F | Ht 67.0 in | Wt 225.4 lb

## 2020-01-18 DIAGNOSIS — H9012 Conductive hearing loss, unilateral, left ear, with unrestricted hearing on the contralateral side: Secondary | ICD-10-CM | POA: Diagnosis not present

## 2020-01-18 DIAGNOSIS — E1143 Type 2 diabetes mellitus with diabetic autonomic (poly)neuropathy: Secondary | ICD-10-CM

## 2020-01-18 LAB — GLUCOSE, CAPILLARY: Glucose-Capillary: 76 mg/dL (ref 70–99)

## 2020-01-18 MED ORDER — LEVOCETIRIZINE DIHYDROCHLORIDE 5 MG PO TABS: 5.0000 mg | ORAL_TABLET | Freq: Every evening | ORAL | 2 refills | Status: DC

## 2020-01-18 MED ORDER — FLUTICASONE PROPIONATE 50 MCG/ACT NA SUSP: 2.0000 | Freq: Every day | NASAL | 2 refills | Status: DC

## 2020-01-18 NOTE — Assessment & Plan Note (Signed)
Patient presents with 2 weeks of progressive hearing loss in the left ear. She denies pain, trauma, tinnitus, fevers, chills. She has noticed thicker wax when she has tried to remove with q tips. On exam, she cannot hear finger rub on the right. And rinne test is consistent with conductive hearing loss. She has some ear wax that is obstructing view of TM.  Both ears were flushed in office today. Also prescribed flonase and xyzal for chronic allergic rhinitis that may be contributing to eustachian tube dysfunction.  If these interventions do not improve her symptoms, will refer to ENT.

## 2020-01-18 NOTE — Progress Notes (Signed)
Acute Office Visit  Subjective:    Patient ID: [name], female    DOB: [birth year], 74 y.o.   MRN: [id number]  Chief Complaint  Patient presents with  . Decreased hearing in left ear         HPI Patient is in today for progressive hearing loss of left ear. Please see problem based charting for further details.   Past Medical History:  Diagnosis Date  . AAA (abdominal aortic aneurysm) (HCC)    3.8 cm Infrarenal managed at [facility]  . Clotting disorder ([place])    PE, IVC filter placed at [facility]  . Diabetes mellitus   . Hypertension   . Hypothyroidism   . Morbid obesity (HCC)    BMI 34  . Renal transplant recipient    [facility]    Past Surgical History:  Procedure Laterality Date  . ABDOMINAL AORTIC ANEURYSM REPAIR    . KIDNEY TRANSPLANT      No family history on file.  Social History   Socioeconomic History  . Marital status: Divorced    Spouse name: Not on file  . Number of children: Not on file  . Years of education: 2 yrs coll  . Highest education level: Not on file  Occupational History  . Not on file  Tobacco Use  . Smoking status: Former Smoker    Quit date: 03/02/2009    Years since quitting: 10.8  . Smokeless tobacco: Never Used  . Tobacco comment: Quit x 78yrs.  Substance and Sexual Activity  . Alcohol use: Yes    Alcohol/week: 0.0 standard drinks    Comment: Wine sometimes.  . Drug use: No  . Sexual activity: Not on file  Other Topics Concern  . Not on file  Social History Narrative  . Not on file   Social Determinants of Health   Financial Resource Strain:   . Difficulty of Paying Living Expenses:   Food Insecurity:   . Worried About Charity fundraiser in the Last Year:   . Arboriculturist in the Last Year:   Transportation Needs:   . Film/video editor (Medical):   Marland Kitchen Lack of Transportation (Non-Medical):   Physical Activity:   . Days of Exercise per Week:   . Minutes of Exercise per Session:   Stress:   . Feeling  of Stress :   Social Connections:   . Frequency of Communication with Friends and Family:   . Frequency of Social Gatherings with Friends and Family:   . Attends Religious Services:   . Active Member of Clubs or Organizations:   . Attends Archivist Meetings:   Marland Kitchen Marital Status:   Intimate Partner Violence:   . Fear of Current or Ex-Partner:   . Emotionally Abused:   Marland Kitchen Physically Abused:   . Sexually Abused:     Outpatient Medications Prior to Visit  Medication Sig Dispense Refill  . Accu-Chek Softclix Lancets lancets Check blood sugar up to 3 times a day  as instructed 100 each 12  . Cholecalciferol 1000 UNITS tablet Take 1,000 Units by mouth.    . diclofenac sodium (VOLTAREN) 1 % GEL Apply 2 g topically 2 (two) times daily. 100 g 1  . furosemide (LASIX) 40 MG tablet Take 1 tablet (40 mg total) by mouth daily. 90 tablet 3  . glucose blood (ACCU-CHEK AVIVA) test strip Use to test blood sugars three times a day. Dx code:E11.9 100 each 5  . Insulin Glargine (  LANTUS SOLOSTAR) 100 UNIT/ML Solostar Pen INJECT 35 UNITS INTO SKIN DAILY AT 10 PM 45 mL 5  . Insulin Pen Needle (BD PEN NEEDLE NANO U/F) 32G X 4 MM MISC USE TO GIVE INSULIN 4 TIMES A DAY 300 each 1  . levothyroxine (SYNTHROID) 100 MCG tablet Take 1 tablet (100 mcg total) by mouth daily. 90 tablet 3  . Multiple Vitamin (MULTIVITAMIN WITH MINERALS) TABS tablet Take 1 tablet by mouth daily.    . mycophenolate (CELLCEPT) 250 MG capsule Take 2 capsules (500 mg total) by mouth 2 (two) times daily. 60 capsule 3  . pantoprazole (PROTONIX) 40 MG tablet Take 1 tablet (40 mg total) by mouth daily. 90 tablet 3  . pentoxifylline (TRENTAL) 400 MG CR tablet Take 400 mg by mouth 2 (two) times daily.  0  . potassium chloride (KLOR-CON) 8 MEQ tablet Take 8 mEq by mouth 3 (three) times a week. Mon/Wed/Fri    . predniSONE (DELTASONE) 5 MG tablet TAKE 1 TABLET BY MOUTH DAILY.    Marland Kitchen sulfamethoxazole-trimethoprim (BACTRIM,SEPTRA) 400-80 MG per  tablet Take 1 tablet by mouth every Monday, Wednesday, and Friday.    . tacrolimus (PROGRAF) 1 MG capsule Take 5 capsules (5 mg total) by mouth 2 (two) times daily. Takes 4.5mg  total twice daily 300 capsule 11  . trimethoprim-polymyxin b (POLYTRIM) ophthalmic solution Place 1 drop into both eyes every 4 (four) hours. 10 mL 0  . valACYclovir (VALTREX) 1000 MG tablet Take 1 tablet (1,000 mg total) by mouth daily. For 5 days as needed for rash on buttocks. 30 tablet 2   No facility-administered medications prior to visit.    Allergies  Allergen Reactions  . Hectorol [Doxercalciferol] Anaphylaxis and Shortness Of Breath  . Latex Anaphylaxis    discharge  . Other     Thin toilet tissue and medical paper table covering causes rash.  . Vitamin D Analogs Anaphylaxis and Shortness Of Breath    Could not catch breath and had bad reaction  . Coumadin [Warfarin Sodium] Other (See Comments)    Hemorhage. Has IVC filter  . Lisinopril Cough    Review of Systems  Constitutional: Negative for chills and fever.  HENT: Positive for postnasal drip and rhinorrhea. Negative for ear pain, sore throat, tinnitus and trouble swallowing.   Neurological: Negative for dizziness and headaches.       Objective:    Physical Exam Constitutional:      General: She is not in acute distress.    Appearance: Normal appearance.  HENT:     Right Ear: Hearing and tympanic membrane normal. No tenderness.     Left Ear: Decreased hearing noted. No tenderness. There is impacted cerumen.     Ears:     Left Rinne: BC > AC. Neurological:     Mental Status: She is alert.     BP (!) 132/76 (BP Location: Left Arm, Patient Position: Sitting, Cuff Size: Large)   Pulse 89   Temp 98.3 F (36.8 C) (Oral)   Ht 5\' 7"  (1.702 m)   Wt (!) 225 lb 6.4 oz (102.2 kg)   SpO2 99% Comment: room air  BMI 35.30 kg/m  Wt Readings from Last 3 Encounters:  01/18/20 (!) 225 lb 6.4 oz (102.2 kg)  11/10/19 228 lb 12.8 oz (103.8 kg)    12/05/18 212 lb 3.2 oz (96.3 kg)    Health Maintenance Due  Topic Date Due  . Hepatitis C Screening  Never done  . TETANUS/TDAP  Never done  .  COLONOSCOPY  Never done  . DEXA SCAN  Never done  . PNA vac Low Risk Adult (2 of 2 - PPSV23) 07/22/2015  . URINE MICROALBUMIN  09/22/2015  . OPHTHALMOLOGY EXAM  07/19/2016  . MAMMOGRAM  09/07/2016  . HEMOGLOBIN A1C  03/07/2019    There are no preventive care reminders to display for this patient.   Lab Results  Component Value Date   TSH 0.927 04/14/2018   Lab Results  Component Value Date   WBC 7.6 06/01/2014   HGB 11.9 (L) 06/01/2014   HCT 37.8 06/01/2014   MCV 93.6 06/01/2014   PLT 273 06/01/2014   Lab Results  Component Value Date   NA 139 04/14/2018   K 4.4 04/14/2018   CO2 20 04/14/2018   GLUCOSE 78 04/14/2018   BUN 34 (H) 04/14/2018   CREATININE 1.52 (H) 04/14/2018   BILITOT 0.6 06/01/2014   ALKPHOS 101 06/01/2014   AST 31 06/01/2014   ALT 52 (H) 06/01/2014   PROT 7.0 06/01/2014   ALBUMIN 4.2 06/01/2014   CALCIUM 10.0 04/14/2018   ANIONGAP 15 05/20/2014   Lab Results  Component Value Date   CHOL  11/30/2006    172        ATP III CLASSIFICATION:  <200     mg/dL   Desirable  200-239  mg/dL   Borderline High  >=240    mg/dL   High   Lab Results  Component Value Date   HDL 41 11/30/2006   Lab Results  Component Value Date   LDLCALC (H) 11/30/2006    113        Total Cholesterol/HDL:CHD Risk Coronary Heart Disease Risk Table                     Men   Women  1/2 Average Risk   3.4   3.3   Lab Results  Component Value Date   TRIG 88 11/30/2006   Lab Results  Component Value Date   CHOLHDL 4.2 11/30/2006   Lab Results  Component Value Date   HGBA1C 6.7 (A) 12/05/2018       Assessment & Plan:   Problem List Items Addressed This Visit      Nervous and Auditory   Conductive hearing loss    Patient presents with 2 weeks of progressive hearing loss in the left ear. She denies pain, trauma,  tinnitus, fevers, chills. She has noticed thicker wax when she has tried to remove with q tips. On exam, she cannot hear finger rub on the right. And rinne test is consistent with conductive hearing loss. She has some ear wax that is obstructing view of TM.  Both ears were flushed in office today. Also prescribed flonase and xyzal for chronic allergic rhinitis that may be contributing to eustachian tube dysfunction.  If these interventions do not improve her symptoms, will refer to ENT.           Meds ordered this encounter  Medications  . fluticasone (FLONASE) 50 MCG/ACT nasal spray    Sig: Place 2 sprays into both nostrils daily.    Dispense:  16 g    Refill:  2  . levocetirizine (XYZAL) 5 MG tablet    Sig: Take 1 tablet (5 mg total) by mouth every evening.    Dispense:  30 tablet    Refill:  2     Marylee Belzer D Tekeshia Klahr, DO

## 2020-01-18 NOTE — Patient Instructions (Addendum)
[name],  It was a pleasure meeting you.   Today we discussed your decreased hearing on the left which is likely due to some wax build up. We are flushing them today. If this happens in the future, try to avoid q tips.   I'd also like you to try a daily nasal spray and allergy pill for your chronic sinus drainage.   Please follow-up with your primary care doctor for a visit to address your other medical conditions.   Take care, Dr. Koleen Distance

## 2020-01-19 NOTE — Progress Notes (Signed)
Internal Medicine Clinic Attending  Case discussed with Dr. Bloomfield  At the time of the visit.  We reviewed the resident's history and exam and pertinent patient test results.  I agree with the assessment, diagnosis, and plan of care documented in the resident's note.  

## 2020-02-16 DIAGNOSIS — Z940 Kidney transplant status: Secondary | ICD-10-CM | POA: Diagnosis not present

## 2020-02-16 DIAGNOSIS — L853 Xerosis cutis: Secondary | ICD-10-CM | POA: Diagnosis not present

## 2020-02-16 DIAGNOSIS — I8312 Varicose veins of left lower extremity with inflammation: Secondary | ICD-10-CM | POA: Diagnosis not present

## 2020-02-16 DIAGNOSIS — I8311 Varicose veins of right lower extremity with inflammation: Secondary | ICD-10-CM | POA: Diagnosis not present

## 2020-02-23 DIAGNOSIS — H353134 Nonexudative age-related macular degeneration, bilateral, advanced atrophic with subfoveal involvement: Secondary | ICD-10-CM | POA: Diagnosis not present

## 2020-02-23 DIAGNOSIS — H35033 Hypertensive retinopathy, bilateral: Secondary | ICD-10-CM | POA: Diagnosis not present

## 2020-02-23 DIAGNOSIS — H43822 Vitreomacular adhesion, left eye: Secondary | ICD-10-CM | POA: Diagnosis not present

## 2020-02-23 DIAGNOSIS — H43811 Vitreous degeneration, right eye: Secondary | ICD-10-CM | POA: Diagnosis not present

## 2020-03-04 ENCOUNTER — Inpatient Hospital Stay (HOSPITAL_COMMUNITY)
Admission: EM | Admit: 2020-03-04 | Discharge: 2020-03-06 | DRG: 178 | Disposition: A | Discharge status: Home Health | Payer: Medicare Other | Attending: Internal Medicine | Admitting: Internal Medicine

## 2020-03-04 ENCOUNTER — Other Ambulatory Visit: Payer: Self-pay

## 2020-03-04 ENCOUNTER — Observation Stay (HOSPITAL_COMMUNITY): Payer: Medicare Other

## 2020-03-04 ENCOUNTER — Encounter (HOSPITAL_COMMUNITY): Payer: Self-pay

## 2020-03-04 DIAGNOSIS — E1122 Type 2 diabetes mellitus with diabetic chronic kidney disease: Secondary | ICD-10-CM | POA: Diagnosis present

## 2020-03-04 DIAGNOSIS — N1832 Chronic kidney disease, stage 3b: Secondary | ICD-10-CM | POA: Diagnosis present

## 2020-03-04 DIAGNOSIS — N179 Acute kidney failure, unspecified: Secondary | ICD-10-CM | POA: Diagnosis present

## 2020-03-04 DIAGNOSIS — Z95828 Presence of other vascular implants and grafts: Secondary | ICD-10-CM

## 2020-03-04 DIAGNOSIS — Z940 Kidney transplant status: Secondary | ICD-10-CM | POA: Diagnosis not present

## 2020-03-04 DIAGNOSIS — E871 Hypo-osmolality and hyponatremia: Secondary | ICD-10-CM | POA: Diagnosis not present

## 2020-03-04 DIAGNOSIS — Z888 Allergy status to other drugs, medicaments and biological substances status: Secondary | ICD-10-CM

## 2020-03-04 DIAGNOSIS — E039 Hypothyroidism, unspecified: Secondary | ICD-10-CM | POA: Diagnosis present

## 2020-03-04 DIAGNOSIS — Z87891 Personal history of nicotine dependence: Secondary | ICD-10-CM

## 2020-03-04 DIAGNOSIS — Z794 Long term (current) use of insulin: Secondary | ICD-10-CM

## 2020-03-04 DIAGNOSIS — R509 Fever, unspecified: Secondary | ICD-10-CM | POA: Diagnosis not present

## 2020-03-04 DIAGNOSIS — Z7982 Long term (current) use of aspirin: Secondary | ICD-10-CM

## 2020-03-04 DIAGNOSIS — I714 Abdominal aortic aneurysm, without rupture: Secondary | ICD-10-CM | POA: Diagnosis present

## 2020-03-04 DIAGNOSIS — Z79890 Hormone replacement therapy (postmenopausal): Secondary | ICD-10-CM

## 2020-03-04 DIAGNOSIS — Z86718 Personal history of other venous thrombosis and embolism: Secondary | ICD-10-CM

## 2020-03-04 DIAGNOSIS — I82401 Acute embolism and thrombosis of unspecified deep veins of right lower extremity: Secondary | ICD-10-CM | POA: Diagnosis present

## 2020-03-04 DIAGNOSIS — U071 COVID-19: Secondary | ICD-10-CM | POA: Diagnosis not present

## 2020-03-04 DIAGNOSIS — J029 Acute pharyngitis, unspecified: Secondary | ICD-10-CM | POA: Diagnosis not present

## 2020-03-04 DIAGNOSIS — Z66 Do not resuscitate: Secondary | ICD-10-CM | POA: Diagnosis present

## 2020-03-04 DIAGNOSIS — Z79899 Other long term (current) drug therapy: Secondary | ICD-10-CM

## 2020-03-04 DIAGNOSIS — Z91040 Latex allergy status: Secondary | ICD-10-CM

## 2020-03-04 DIAGNOSIS — D689 Coagulation defect, unspecified: Secondary | ICD-10-CM | POA: Diagnosis not present

## 2020-03-04 DIAGNOSIS — Z7952 Long term (current) use of systemic steroids: Secondary | ICD-10-CM

## 2020-03-04 DIAGNOSIS — I129 Hypertensive chronic kidney disease with stage 1 through stage 4 chronic kidney disease, or unspecified chronic kidney disease: Secondary | ICD-10-CM | POA: Diagnosis present

## 2020-03-04 DIAGNOSIS — N183 Chronic kidney disease, stage 3 unspecified: Secondary | ICD-10-CM | POA: Diagnosis present

## 2020-03-04 DIAGNOSIS — J439 Emphysema, unspecified: Secondary | ICD-10-CM | POA: Diagnosis not present

## 2020-03-04 DIAGNOSIS — J9811 Atelectasis: Secondary | ICD-10-CM | POA: Diagnosis not present

## 2020-03-04 LAB — BASIC METABOLIC PANEL
Anion gap: 14 (ref 5–15)
BUN: 28 mg/dL — ABNORMAL HIGH (ref 8–23)
CO2: 24 mmol/L (ref 22–32)
Calcium: 9.9 mg/dL (ref 8.9–10.3)
Chloride: 95 mmol/L — ABNORMAL LOW (ref 98–111)
Creatinine, Ser: 2.13 mg/dL — ABNORMAL HIGH (ref 0.44–1.00)
GFR calc Af Amer: 26 mL/min — ABNORMAL LOW (ref >60)
GFR calc non Af Amer: 23 mL/min — ABNORMAL LOW (ref >60)
Glucose, Bld: 137 mg/dL — ABNORMAL HIGH (ref 70–99)
Potassium: 4.3 mmol/L (ref 3.5–5.1)
Sodium: 133 mmol/L — ABNORMAL LOW (ref 135–145)

## 2020-03-04 LAB — CBC
HCT: 39.1 % (ref 36.0–46.0)
Hemoglobin: 12.3 g/dL (ref 12.0–15.0)
MCH: 29.1 pg (ref 26.0–34.0)
MCHC: 31.5 g/dL (ref 30.0–36.0)
MCV: 92.4 fL (ref 80.0–100.0)
Platelets: 293 10*3/uL (ref 150–400)
RBC: 4.23 MIL/uL (ref 3.87–5.11)
RDW: 15.6 % — ABNORMAL HIGH (ref 11.5–15.5)
WBC: 7.3 10*3/uL (ref 4.0–10.5)
nRBC: 0 % (ref 0.0–0.2)

## 2020-03-04 LAB — CBG MONITORING, ED: Glucose-Capillary: 126 mg/dL — ABNORMAL HIGH (ref 70–99)

## 2020-03-04 LAB — SARS CORONAVIRUS 2 BY RT PCR (HOSPITAL ORDER, PERFORMED IN ~~LOC~~ HOSPITAL LAB): SARS Coronavirus 2: POSITIVE — AB

## 2020-03-04 LAB — GROUP A STREP BY PCR: Group A Strep by PCR: NOT DETECTED

## 2020-03-04 MED ORDER — TACROLIMUS 1 MG PO CAPS
4.0000 mg | ORAL_CAPSULE | Freq: Every day | ORAL | Status: DC
  Administered 2020-03-05 (×2): 4 mg via ORAL
  Filled 2020-03-04 (×4): qty 4

## 2020-03-04 MED ORDER — METHYLPREDNISOLONE SODIUM SUCC 125 MG IJ SOLR: 125.0000 mg | Freq: Once | INTRAMUSCULAR | Status: DC | PRN

## 2020-03-04 MED ORDER — ALBUTEROL SULFATE HFA 108 (90 BASE) MCG/ACT IN AERS
2.0000 | INHALATION_SPRAY | Freq: Once | RESPIRATORY_TRACT | Status: AC | PRN
  Administered 2020-03-06: 2 via RESPIRATORY_TRACT
  Filled 2020-03-04: qty 6.7

## 2020-03-04 MED ORDER — FAMOTIDINE IN NACL 20-0.9 MG/50ML-% IV SOLN: 20.0000 mg | Freq: Once | INTRAVENOUS | Status: DC | PRN

## 2020-03-04 MED ORDER — LACTATED RINGERS IV BOLUS
500.0000 mL | Freq: Once | INTRAVENOUS | Status: AC
  Administered 2020-03-04: 500 mL via INTRAVENOUS

## 2020-03-04 MED ORDER — INSULIN GLARGINE 100 UNIT/ML ~~LOC~~ SOLN
15.0000 [IU] | SUBCUTANEOUS | Status: AC
  Administered 2020-03-04: 15 [IU] via SUBCUTANEOUS
  Filled 2020-03-04: qty 0.15

## 2020-03-04 MED ORDER — SODIUM CHLORIDE 0.9 % IV SOLN
1200.0000 mg | Freq: Once | INTRAVENOUS | Status: AC
  Administered 2020-03-05: 1200 mg via INTRAVENOUS
  Filled 2020-03-04 (×2): qty 10

## 2020-03-04 MED ORDER — DIPHENHYDRAMINE HCL 50 MG/ML IJ SOLN: 50.0000 mg | Freq: Once | INTRAMUSCULAR | Status: DC | PRN

## 2020-03-04 MED ORDER — EPINEPHRINE 0.3 MG/0.3ML IJ SOAJ: 0.3000 mg | Freq: Once | INTRAMUSCULAR | Status: DC | PRN

## 2020-03-04 MED ORDER — SODIUM CHLORIDE 0.9 % IV SOLN
INTRAVENOUS | Status: DC | PRN
  Administered 2020-03-05: 1000 mL via INTRAVENOUS

## 2020-03-04 MED ORDER — MYCOPHENOLATE MOFETIL 250 MG PO CAPS
500.0000 mg | ORAL_CAPSULE | Freq: Two times a day (BID) | ORAL | Status: DC
  Administered 2020-03-05 – 2020-03-06 (×4): 500 mg via ORAL
  Filled 2020-03-04 (×6): qty 2

## 2020-03-04 NOTE — ED Provider Notes (Signed)
[facility] EMERGENCY DEPARTMENT Provider Note   CSN: [id number] Arrival date & time: 03/04/20  1330     History Chief Complaint  Patient presents with   Fever    [name] is a 74 y.o. female past medical history of insulin-dependent diabetes, hypertension, hypothyroidism, obesity, AAA, renal transplant recipient in 2010 presents the ED today for myalgias, headache, nausea, sore throat gradually worsening over 3-day interval.  Patient denies known sick contacts and is notably fully vaccinated against COVID-19.  She says the most concerning symptom is her sore throat, but states that she is not been able to eat and drink well because of this, most recently it is improved a little and she has been tolerating p.o. today.  States the reason for presentation today was a fever of 100.5 this morning.  The history is provided by the patient.  Illness Quality:  Sore throat, fever Severity:  Moderate Onset quality:  Gradual Duration:  4 days Timing:  Constant Progression:  Worsening Chronicity:  New Associated symptoms: cough, fatigue, fever, headaches, myalgias and sore throat   Associated symptoms: no abdominal pain, no chest pain, no rash, no shortness of breath and no vomiting        Past Medical History:  Diagnosis Date   AAA (abdominal aortic aneurysm) (HCC)    3.8 cm Infrarenal managed at [facility] ([place])    PE, IVC filter placed at [facility]   Diabetes mellitus    Hypertension    Hypothyroidism    Morbid obesity ([place])    BMI 34   Renal transplant recipient    [facility]    Patient Active Problem List   Diagnosis Date Noted   Acute renal failure superimposed on stage 3b chronic kidney disease ([place]) 03/04/2020   Acute on chronic renal failure ([place]) 03/04/2020   Benign skin lesion of lower back 11/10/2019   Conductive hearing loss 12/08/2018   Osteoarthritis cervical spine 04/14/2018   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  Social History   Tobacco Use   Smoking status: Former Smoker    Quit date: 03/02/2009    Years since quitting: 11.0   Smokeless tobacco: Never Used   Tobacco comment: Quit x 48yrs.  Substance Use Topics   Alcohol use: Yes    Alcohol/week: 0.0 standard drinks    Comment: Wine sometimes.   Drug use: No    Home Medications  Prior to Admission medications   Medication Sig Start Date End Date Taking? Authorizing Provider  amLODipine (NORVASC) 5 MG tablet Take 5 mg by mouth at bedtime. 12/28/19  Yes [provider]  aspirin EC 81 MG tablet Take 81 mg by mouth at bedtime. Swallow whole.   Yes [provider]  cholecalciferol (VITAMIN D3) 25 MCG (1000 UNIT) tablet Take 1,000 Units by mouth daily.   Yes [provider]  furosemide (LASIX) 40 MG tablet Take 1 tablet (40 mg total) by mouth daily. 07/14/19  Yes Axel Filler, MD  Insulin Glargine (LANTUS SOLOSTAR) 100 UNIT/ML Solostar Pen INJECT 35 UNITS INTO SKIN DAILY AT 10 PM Patient taking differently: Inject 35 Units into the skin at bedtime.  12/05/18  Yes Isabelle Course, MD    levothyroxine (SYNTHROID) 100 MCG tablet Take 1 tablet (100 mcg total) by mouth daily. 05/15/19  Yes Axel Filler, MD  Magnesium 500 MG TABS Take 500 mg by mouth 2 (two) times daily.   Yes [provider]  Multiple Vitamin (MULTIVITAMIN WITH MINERALS) TABS tablet Take 1 tablet by mouth daily.   Yes [provider]  Multiple Vitamins-Minerals (PRESERVISION AREDS) CAPS Take 1 capsule by mouth 2 (two) times daily.   Yes [provider]  mycophenolate (CELLCEPT) 250 MG capsule Take 2 capsules (500 mg total) by mouth 2 (two) times daily. 05/20/14  Yes Burns, Alexa R, MD  pantoprazole (PROTONIX) 40 MG tablet Take 1 tablet (40 mg total) by mouth daily. Patient taking differently: Take 40 mg by mouth See admin instructions. Take one tablet (40 mg) by mouth every morning, may take a 2nd tablet (40 mg) as needed for acid reflux/pain 07/14/19  Yes Axel Filler, MD  pentoxifylline (TRENTAL) 400 MG CR tablet Take 400 mg by mouth 2 (two) times daily. 03/13/16  Yes [provider]  predniSONE (DELTASONE) 5 MG tablet Take 5 mg by mouth daily with breakfast.  01/20/19  Yes [provider]  sulfamethoxazole-trimethoprim (BACTRIM,SEPTRA) 400-80 MG per tablet Take 1 tablet by mouth every Monday, Wednesday, and Friday.   Yes [provider]  tacrolimus (PROGRAF) 1 MG capsule Take 5 capsules (5 mg total) by mouth 2 (two) times daily. Takes 4.5mg  total twice daily Patient taking differently: Take 4-5 mg by mouth See admin instructions. Take 5 capsules (5 mg) by mouth every morning and take 4 capsules (4 mg) every night 08/19/17  Yes Axel Filler, MD  Accu-Chek Softclix Lancets lancets Check blood sugar up to 3 times a day  as instructed 12/02/19   Axel Filler, MD  diclofenac sodium (VOLTAREN) 1 % GEL Apply 2 g topically 2 (two) times daily. Patient not taking: Reported on 03/04/2020 04/14/18   Axel Filler, MD  fluticasone  [facility]) 50 MCG/ACT nasal spray Place 2 sprays into both nostrils daily. Patient not taking: Reported on 03/04/2020 01/18/20 01/17/21  Modena Nunnery D, DO  glucose blood (ACCU-CHEK AVIVA) test strip Use to test blood sugars three times a day. Dx code:E11.9 12/02/19   Axel Filler, MD  Insulin Pen Needle (BD PEN NEEDLE NANO U/F) 32G X 4 MM MISC USE TO GIVE INSULIN 4 TIMES A DAY 12/02/19   Axel Filler, MD  levocetirizine (XYZAL) 5 MG tablet Take 1 tablet (5 mg total) by mouth every evening. Patient not taking: Reported on 03/04/2020 01/18/20   Modena Nunnery D, DO  valACYclovir (VALTREX) 1000 MG tablet Take 1 tablet (1,000 mg total) by mouth  daily. For 5 days as needed for rash on buttocks. Patient not taking: Reported on 03/04/2020 08/19/17   Axel Filler, MD    Allergies    Hectorol [doxercalciferol], Latex, Other, Vitamin d analogs, Coumadin [warfarin sodium], and Lisinopril  Review of Systems   Review of Systems  Constitutional: Positive for chills, fatigue and fever.  HENT: Positive for sore throat. Negative for facial swelling and voice change.   Eyes: Negative for redness and visual disturbance.  Respiratory: Positive for cough. Negative for shortness of breath.   Cardiovascular: Negative for chest pain and palpitations.  Gastrointestinal: Negative for abdominal pain and vomiting.  Genitourinary: Negative for difficulty urinating and dysuria.  Musculoskeletal: Positive for myalgias. Negative for gait problem and joint swelling.  Skin: Negative for rash and wound.  Neurological: Positive for headaches. Negative for dizziness.  Psychiatric/Behavioral: Negative for confusion and suicidal ideas.    Physical Exam Updated Vital Signs BP 121/75 (BP Location: Right Arm)    Pulse (!) 102    Temp 100 F (37.8 C) (Oral)    Resp 16    Ht 5\' 7"  (1.702 m)    SpO2 94%    BMI 35.30 kg/m   Physical Exam Constitutional:      General: She is not in acute  distress. HENT:     Head: Normocephalic and atraumatic.     Mouth/Throat:     Mouth: Mucous membranes are moist.     Pharynx: Oropharynx is clear.     Comments: Mild oropharyngeal erythema without exudate or tonsillitis Eyes:     General: No scleral icterus.    Pupils: Pupils are equal, round, and reactive to light.  Cardiovascular:     Rate and Rhythm: Normal rate and regular rhythm.     Pulses: Normal pulses.  Pulmonary:     Effort: Pulmonary effort is normal. No respiratory distress.  Abdominal:     General: There is no distension.     Tenderness: There is no abdominal tenderness.  Musculoskeletal:        General: No tenderness or deformity.     Cervical back: Normal range of motion and neck supple.  Neurological:     General: No focal deficit present.     Mental Status: She is alert and oriented to person, place, and time.  Psychiatric:        Mood and Affect: Mood normal.        Behavior: Behavior normal.     ED Results / Procedures / Treatments   Labs (all labs ordered are listed, but only abnormal results are displayed) Labs Reviewed  SARS CORONAVIRUS 2 BY RT PCR ([facility]) - Abnormal; Notable for the following components:      Result Value   SARS Coronavirus 2 POSITIVE (*)    All other components within normal limits  CBC - Abnormal; Notable for the following components:   RDW 15.6 (*)    All other components within normal limits  BASIC METABOLIC PANEL - Abnormal; Notable for the following components:   Sodium 133 (*)    Chloride 95 (*)    Glucose, Bld 137 (*)    BUN 28 (*)    Creatinine, Ser 2.13 (*)    GFR calc non Af Amer 23 (*)    GFR calc Af Amer 26 (*)    All other components within normal limits  CBG MONITORING, ED - Abnormal; Notable for the following components:   Glucose-Capillary 126 (*)  All other components within normal limits  GROUP A STREP BY PCR  CULTURE, BLOOD (ROUTINE X 2)  CULTURE, BLOOD  (ROUTINE X 2)  LACTIC ACID, PLASMA  LACTIC ACID, PLASMA  D-DIMER, QUANTITATIVE (NOT AT [facility])  PROCALCITONIN  LACTATE DEHYDROGENASE  FERRITIN  TRIGLYCERIDES  FIBRINOGEN  C-REACTIVE PROTEIN    EKG None  Radiology No results found.  Procedures Procedures (including critical care time)  Medications Ordered in ED Medications  mycophenolate (CELLCEPT) capsule 500 mg (has no administration in time range)  tacrolimus (PROGRAF) capsule 4 mg (has no administration in time range)  casirivimab-imdevimab (REGEN-COV) 1,200 mg in sodium chloride 0.9 % 110 mL IVPB (has no administration in time range)  0.9 %  sodium chloride infusion (has no administration in time range)  diphenhydrAMINE (BENADRYL) injection 50 mg (has no administration in time range)  famotidine (PEPCID) IVPB 20 mg premix (has no administration in time range)  methylPREDNISolone sodium succinate (SOLU-MEDROL) 125 mg/2 mL injection 125 mg (has no administration in time range)  albuterol (VENTOLIN HFA) 108 (90 Base) MCG/ACT inhaler 2 puff (has no administration in time range)  EPINEPHrine (EPI-PEN) injection 0.3 mg (has no administration in time range)  lactated ringers bolus 500 mL (500 mLs Intravenous New Bag/Given 03/04/20 2308)  insulin glargine (LANTUS) injection 15 Units (15 Units Subcutaneous Given 03/04/20 2244)    ED Course  I have reviewed the triage vital signs and the nursing notes.  Pertinent labs & imaging results that were available during my care of the patient were reviewed by me and considered in my medical decision making (see chart for details).    MDM Rules/Calculators/A&P                          Differential diagnosis considered: COVID-19, viral illness, acute hypoxic respiratory failure, electrolyte disturbance, dehydration, AKI, stroke pharyngitis  Patient presenting to the ED for sore throat, headache, myalgias, dry cough for the last 3 to 4 days, her major concern is that she had a fever of  100.5 this morning and given her chronic immunosuppression has been told to present to the ED for fevers.  On arrival she is well-appearing, mildly tachycardic otherwise normal vital signs, temperature of 100.0.  No oxygen requirement this time.  Triage labs and vitals reviewed significant for positive COVID-19 testing as well as what appears to be a slight AKI with creatinine 2.15 up from 1.75 on most recent check at the end of June.  This is most concerning and the fact that she has a single transplanted kidney suspect is likely due to relative dehydration of the last few days considering patient states that she has not been able to tolerate p.o. well.  Is not neutropenic  Patient ambulated throughout the room without desaturating below 95% this time I do not feel that she requires admission from a respiratory failure standpoint, and may be a ideal candidate for monoclonal antibody infusion.  We will administer 500 cc of LR now, with no consolidation on chest x-ray do not feel that she would benefit from antibiotics and I have low suspicion for PE given the lack of chest pain entirely.  Internal medicine teaching service consulted for admission given AKI in the setting of transplanted kidney with air agree with plan for [place] antibody and fusion which was ordered as well as hydration, will admit to their service.  Necessary home medications ordered per patient request.  Labs reviewed and interpreted by myself with significant  findings above. Imaging reviewed by myself and interpreted by radiologist.  Case and plan above discussed with my attending Dr. Gilford Raid   Final Clinical Impression(s) / ED Diagnoses Final diagnoses:  COVID-19  AKI (acute kidney injury) [facility])  Renal transplant recipient    Rx / DC Orders ED Discharge Orders    None     Labs, studies and imaging reviewed by myself and considered in medical decision making if ordered. Imaging interpreted by radiology. Pt was  discussed with my attending, Dr. Gilford Raid  Electronically signed by:  Roderic Palau [place]9/10/202111:20 PM       Renold Genta, MD 03/04/20 1914    Isla Pence, MD 03/05/20 1558

## 2020-03-04 NOTE — ED Triage Notes (Signed)
Emergency Medicine Provider Triage Evaluation Note  [name] , a 74 y.o. female  was evaluated in triage.  Pt complains of nasal congestion and sore throat.  Tells me symptoms have been ongoing for around a month and that she was seen for the symptoms somewhere previously and was prescribed a nasal spray.  Denies difficulty swallowing, shortness of breath, chest pain.  No known sick contacts.  She is fully vaccinated against Covid.  Review of Systems  Positive: Sore throat, nasal congestion, cough Negative: Shortness of breath, chest pain  Physical Exam  BP (!) 141/74   Pulse (!) 113   Temp 100 F (37.8 C) (Oral)   Resp 18   Ht 5\' 7"  (1.702 m)   SpO2 93%   BMI 35.30 kg/m  Gen:   Awake, no distress   HEENT:  Atraumatic, tolerating secretions without difficulty.  Sounds audibly congested.  Posterior oropharynx without tonsillar hypertrophy, exudates or uvular deviation. Resp:  Normal effort  Cardiac:  Tachycardic Abd:   Nondistended, nontender  MSK:   Moves extremities without difficulty  Neuro:  Speech clear   Medical Decision Making  Medically screening exam initiated at 2:27 PM.  Appropriate orders placed.  [name] was informed that the remainder of the evaluation will be completed by another provider, this initial triage assessment does not replace that evaluation, and the importance of remaining in the ED until their evaluation is complete.  Clinical Impression  No evidence of airway impingement.  Tolerating secretions without difficulty.  Patient exhibits low-grade fevers and tachycardia in the ED, will obtain Covid test and strep test.   Renita Papa, PA-C 03/04/20 1429

## 2020-03-04 NOTE — ED Triage Notes (Signed)
Pt arrives to ED w/ c/o generalized body aches, fever, and sore throat that started yesterday.

## 2020-03-05 DIAGNOSIS — I129 Hypertensive chronic kidney disease with stage 1 through stage 4 chronic kidney disease, or unspecified chronic kidney disease: Secondary | ICD-10-CM | POA: Diagnosis present

## 2020-03-05 DIAGNOSIS — Z95828 Presence of other vascular implants and grafts: Secondary | ICD-10-CM | POA: Diagnosis not present

## 2020-03-05 DIAGNOSIS — M2550 Pain in unspecified joint: Secondary | ICD-10-CM | POA: Diagnosis not present

## 2020-03-05 DIAGNOSIS — R52 Pain, unspecified: Secondary | ICD-10-CM | POA: Diagnosis not present

## 2020-03-05 DIAGNOSIS — Z79890 Hormone replacement therapy (postmenopausal): Secondary | ICD-10-CM | POA: Diagnosis not present

## 2020-03-05 DIAGNOSIS — Z888 Allergy status to other drugs, medicaments and biological substances status: Secondary | ICD-10-CM | POA: Diagnosis not present

## 2020-03-05 DIAGNOSIS — Z66 Do not resuscitate: Secondary | ICD-10-CM | POA: Diagnosis present

## 2020-03-05 DIAGNOSIS — I714 Abdominal aortic aneurysm, without rupture: Secondary | ICD-10-CM | POA: Diagnosis present

## 2020-03-05 DIAGNOSIS — I82401 Acute embolism and thrombosis of unspecified deep veins of right lower extremity: Secondary | ICD-10-CM | POA: Diagnosis present

## 2020-03-05 DIAGNOSIS — Z87891 Personal history of nicotine dependence: Secondary | ICD-10-CM | POA: Diagnosis not present

## 2020-03-05 DIAGNOSIS — Z79899 Other long term (current) drug therapy: Secondary | ICD-10-CM | POA: Diagnosis not present

## 2020-03-05 DIAGNOSIS — Z91040 Latex allergy status: Secondary | ICD-10-CM | POA: Diagnosis not present

## 2020-03-05 DIAGNOSIS — R0902 Hypoxemia: Secondary | ICD-10-CM | POA: Diagnosis not present

## 2020-03-05 DIAGNOSIS — N1832 Chronic kidney disease, stage 3b: Secondary | ICD-10-CM | POA: Diagnosis present

## 2020-03-05 DIAGNOSIS — E1122 Type 2 diabetes mellitus with diabetic chronic kidney disease: Secondary | ICD-10-CM | POA: Diagnosis present

## 2020-03-05 DIAGNOSIS — U071 COVID-19: Secondary | ICD-10-CM | POA: Diagnosis present

## 2020-03-05 DIAGNOSIS — E039 Hypothyroidism, unspecified: Secondary | ICD-10-CM | POA: Diagnosis present

## 2020-03-05 DIAGNOSIS — Z794 Long term (current) use of insulin: Secondary | ICD-10-CM | POA: Diagnosis not present

## 2020-03-05 DIAGNOSIS — Z23 Encounter for immunization: Secondary | ICD-10-CM | POA: Diagnosis not present

## 2020-03-05 DIAGNOSIS — D689 Coagulation defect, unspecified: Secondary | ICD-10-CM | POA: Diagnosis present

## 2020-03-05 DIAGNOSIS — Z7982 Long term (current) use of aspirin: Secondary | ICD-10-CM | POA: Diagnosis not present

## 2020-03-05 DIAGNOSIS — Z86718 Personal history of other venous thrombosis and embolism: Secondary | ICD-10-CM | POA: Diagnosis not present

## 2020-03-05 DIAGNOSIS — Z7401 Bed confinement status: Secondary | ICD-10-CM | POA: Diagnosis not present

## 2020-03-05 DIAGNOSIS — Z7952 Long term (current) use of systemic steroids: Secondary | ICD-10-CM | POA: Diagnosis not present

## 2020-03-05 DIAGNOSIS — E871 Hypo-osmolality and hyponatremia: Secondary | ICD-10-CM | POA: Diagnosis present

## 2020-03-05 DIAGNOSIS — R609 Edema, unspecified: Secondary | ICD-10-CM | POA: Diagnosis not present

## 2020-03-05 DIAGNOSIS — Z940 Kidney transplant status: Secondary | ICD-10-CM | POA: Diagnosis not present

## 2020-03-05 DIAGNOSIS — N179 Acute kidney failure, unspecified: Secondary | ICD-10-CM | POA: Diagnosis present

## 2020-03-05 LAB — COMPREHENSIVE METABOLIC PANEL
ALT: 28 U/L (ref 0–44)
AST: 40 U/L (ref 15–41)
Albumin: 3.5 g/dL (ref 3.5–5.0)
Alkaline Phosphatase: 92 U/L (ref 38–126)
Anion gap: 10 (ref 5–15)
BUN: 27 mg/dL — ABNORMAL HIGH (ref 8–23)
CO2: 25 mmol/L (ref 22–32)
Calcium: 8.8 mg/dL — ABNORMAL LOW (ref 8.9–10.3)
Chloride: 94 mmol/L — ABNORMAL LOW (ref 98–111)
Creatinine, Ser: 2.05 mg/dL — ABNORMAL HIGH (ref 0.44–1.00)
GFR calc Af Amer: 27 mL/min — ABNORMAL LOW (ref >60)
GFR calc non Af Amer: 24 mL/min — ABNORMAL LOW (ref >60)
Glucose, Bld: 105 mg/dL — ABNORMAL HIGH (ref 70–99)
Potassium: 4 mmol/L (ref 3.5–5.1)
Sodium: 129 mmol/L — ABNORMAL LOW (ref 135–145)
Total Bilirubin: 0.7 mg/dL (ref 0.3–1.2)
Total Protein: 7 g/dL (ref 6.5–8.1)

## 2020-03-05 LAB — PROCALCITONIN: Procalcitonin: 0.13 ng/mL

## 2020-03-05 LAB — CBG MONITORING, ED
Glucose-Capillary: 98 mg/dL (ref 70–99)
Glucose-Capillary: 122 mg/dL — ABNORMAL HIGH (ref 70–99)
Glucose-Capillary: 144 mg/dL — ABNORMAL HIGH (ref 70–99)
Glucose-Capillary: 118 mg/dL — ABNORMAL HIGH (ref 70–99)

## 2020-03-05 LAB — D-DIMER, QUANTITATIVE
D-Dimer, Quant: 8.08 ug/mL-FEU — ABNORMAL HIGH (ref 0.00–0.50)
D-Dimer, Quant: 7.85 ug/mL-FEU — ABNORMAL HIGH (ref 0.00–0.50)

## 2020-03-05 LAB — CBC WITH DIFFERENTIAL/PLATELET
Abs Immature Granulocytes: 0.02 10*3/uL (ref 0.00–0.07)
Basophils Absolute: 0 10*3/uL (ref 0.0–0.1)
Basophils Relative: 0 %
Eosinophils Absolute: 0 10*3/uL (ref 0.0–0.5)
Eosinophils Relative: 0 %
HCT: 35.3 % — ABNORMAL LOW (ref 36.0–46.0)
Hemoglobin: 10.9 g/dL — ABNORMAL LOW (ref 12.0–15.0)
Immature Granulocytes: 0 %
Lymphocytes Relative: 25 %
Lymphs Abs: 1.6 10*3/uL (ref 0.7–4.0)
MCH: 28.4 pg (ref 26.0–34.0)
MCHC: 30.9 g/dL (ref 30.0–36.0)
MCV: 91.9 fL (ref 80.0–100.0)
Monocytes Absolute: 0.6 10*3/uL (ref 0.1–1.0)
Monocytes Relative: 10 %
Neutro Abs: 4.4 10*3/uL (ref 1.7–7.7)
Neutrophils Relative %: 65 %
Platelets: 202 10*3/uL (ref 150–400)
RBC: 3.84 MIL/uL — ABNORMAL LOW (ref 3.87–5.11)
RDW: 15.6 % — ABNORMAL HIGH (ref 11.5–15.5)
WBC: 6.7 10*3/uL (ref 4.0–10.5)
nRBC: 0 % (ref 0.0–0.2)

## 2020-03-05 LAB — LACTIC ACID, PLASMA: Lactic Acid, Venous: 1.4 mmol/L (ref 0.5–1.9)

## 2020-03-05 LAB — URINALYSIS, ROUTINE W REFLEX MICROSCOPIC
Bacteria, UA: NONE SEEN
Bilirubin Urine: NEGATIVE
Glucose, UA: NEGATIVE mg/dL
Hgb urine dipstick: NEGATIVE
Ketones, ur: NEGATIVE mg/dL
Leukocytes,Ua: NEGATIVE
Nitrite: NEGATIVE
Protein, ur: 30 mg/dL — AB
Specific Gravity, Urine: 1.012 (ref 1.005–1.030)
pH: 6 (ref 5.0–8.0)

## 2020-03-05 LAB — PHOSPHORUS: Phosphorus: 3.3 mg/dL (ref 2.5–4.6)

## 2020-03-05 LAB — MAGNESIUM: Magnesium: 1.8 mg/dL (ref 1.7–2.4)

## 2020-03-05 LAB — C-REACTIVE PROTEIN
CRP: 4.8 mg/dL — ABNORMAL HIGH (ref <1.0)
CRP: 5.6 mg/dL — ABNORMAL HIGH (ref <1.0)

## 2020-03-05 LAB — LACTATE DEHYDROGENASE: LDH: 178 U/L (ref 98–192)

## 2020-03-05 LAB — FERRITIN
Ferritin: 751 ng/mL — ABNORMAL HIGH (ref 11–307)
Ferritin: 765 ng/mL — ABNORMAL HIGH (ref 11–307)

## 2020-03-05 LAB — TRIGLYCERIDES: Triglycerides: 122 mg/dL (ref <150)

## 2020-03-05 LAB — FIBRINOGEN: Fibrinogen: 539 mg/dL — ABNORMAL HIGH (ref 210–475)

## 2020-03-05 MED ORDER — AMLODIPINE BESYLATE 5 MG PO TABS
5.0000 mg | ORAL_TABLET | Freq: Every day | ORAL | Status: DC
  Administered 2020-03-05: 5 mg via ORAL
  Filled 2020-03-05: qty 1

## 2020-03-05 MED ORDER — PENTOXIFYLLINE ER 400 MG PO TBCR
400.0000 mg | EXTENDED_RELEASE_TABLET | Freq: Two times a day (BID) | ORAL | Status: DC
  Administered 2020-03-05 (×2): 400 mg via ORAL
  Filled 2020-03-05 (×5): qty 1

## 2020-03-05 MED ORDER — LACTATED RINGERS IV BOLUS
500.0000 mL | Freq: Once | INTRAVENOUS | Status: AC
  Administered 2020-03-05: 500 mL via INTRAVENOUS

## 2020-03-05 MED ORDER — INSULIN ASPART 100 UNIT/ML ~~LOC~~ SOLN
0.0000 [IU] | Freq: Three times a day (TID) | SUBCUTANEOUS | Status: DC
  Administered 2020-03-05: 1 [IU] via SUBCUTANEOUS
  Administered 2020-03-06: 2 [IU] via SUBCUTANEOUS
  Administered 2020-03-06: 1 [IU] via SUBCUTANEOUS

## 2020-03-05 MED ORDER — LACTATED RINGERS IV SOLN: INTRAVENOUS | Status: AC

## 2020-03-05 MED ORDER — LEVOTHYROXINE SODIUM 100 MCG PO TABS
100.0000 ug | ORAL_TABLET | Freq: Every day | ORAL | Status: DC
  Administered 2020-03-05 – 2020-03-06 (×2): 100 ug via ORAL
  Filled 2020-03-05 (×2): qty 1

## 2020-03-05 MED ORDER — ENOXAPARIN SODIUM 30 MG/0.3ML ~~LOC~~ SOLN
30.0000 mg | SUBCUTANEOUS | Status: DC
  Administered 2020-03-05 – 2020-03-06 (×2): 30 mg via SUBCUTANEOUS
  Filled 2020-03-05 (×2): qty 0.3

## 2020-03-05 MED ORDER — ASPIRIN EC 81 MG PO TBEC
81.0000 mg | DELAYED_RELEASE_TABLET | Freq: Every day | ORAL | Status: DC
  Administered 2020-03-05 (×2): 81 mg via ORAL
  Filled 2020-03-05 (×2): qty 1

## 2020-03-05 MED ORDER — GUAIFENESIN-DM 100-10 MG/5ML PO SYRP: 5.0000 mL | ORAL_SOLUTION | ORAL | Status: DC | PRN

## 2020-03-05 MED ORDER — HYDROCOD POLST-CPM POLST ER 10-8 MG/5ML PO SUER: 5.0000 mL | Freq: Two times a day (BID) | ORAL | Status: DC | PRN

## 2020-03-05 MED ORDER — PREDNISONE 5 MG PO TABS
5.0000 mg | ORAL_TABLET | Freq: Every day | ORAL | Status: DC
  Administered 2020-03-05 – 2020-03-06 (×2): 5 mg via ORAL
  Filled 2020-03-05 (×3): qty 1

## 2020-03-05 MED ORDER — VITAMIN D 25 MCG (1000 UNIT) PO TABS
1000.0000 [IU] | ORAL_TABLET | Freq: Every day | ORAL | Status: DC
  Administered 2020-03-06: 1000 [IU] via ORAL
  Filled 2020-03-05: qty 1

## 2020-03-05 MED ORDER — SULFAMETHOXAZOLE-TRIMETHOPRIM 400-80 MG PO TABS: 1.0000 | ORAL_TABLET | ORAL | Status: DC

## 2020-03-05 MED ORDER — BENZONATATE 100 MG PO CAPS: 200.0000 mg | ORAL_CAPSULE | Freq: Three times a day (TID) | ORAL | Status: DC | PRN

## 2020-03-05 MED ORDER — INSULIN GLARGINE 100 UNIT/ML ~~LOC~~ SOLN
15.0000 [IU] | Freq: Every day | SUBCUTANEOUS | Status: DC
  Administered 2020-03-05: 15 [IU] via SUBCUTANEOUS
  Filled 2020-03-05 (×2): qty 0.15

## 2020-03-05 MED ORDER — AMLODIPINE BESYLATE 5 MG PO TABS
10.0000 mg | ORAL_TABLET | Freq: Every day | ORAL | Status: DC
  Administered 2020-03-05: 10 mg via ORAL
  Filled 2020-03-05: qty 2

## 2020-03-05 MED ORDER — ACETAMINOPHEN 325 MG PO TABS
650.0000 mg | ORAL_TABLET | Freq: Four times a day (QID) | ORAL | Status: DC | PRN
  Administered 2020-03-05: 650 mg via ORAL
  Filled 2020-03-05: qty 2

## 2020-03-05 NOTE — ED Notes (Signed)
Pt assisted to bedpan and was unable to use bathroom. Pt is not at edge of bed with feet on floor to try nd get cramps out from legs. Pt tolerating well.

## 2020-03-05 NOTE — ED Notes (Signed)
[place] (pt daughter) called to get a update from nurse. Please call [phone]

## 2020-03-05 NOTE — ED Notes (Signed)
Ending antibody vitals

## 2020-03-05 NOTE — Discharge Summary (Addendum)
Name: [name] MRN: [id number] DOB: 25-Mar-1946 74 y.o. PCP: Axel Filler, MD  Date of Admission: 03/04/2020  1:57 PM Date of Discharge: 03/06/2020 Attending Physician: Angelica Pou, MD  Discharge Diagnosis: 1. Covid-19 2. RLE DVT 3. AKI on CKD Stage III s/p renal transplant 2019 4. Hypovolemic Hyponatremia  Discharge Medications: Allergies as of 03/06/2020      Reactions   Hectorol [doxercalciferol] Anaphylaxis, Shortness Of Breath   Latex Anaphylaxis, Other (See Comments)   discharge   Other Rash   Thin toilet tissue and medical paper table covering causes rash.   Vitamin D Analogs Anaphylaxis, Shortness Of Breath   Could not catch breath and had bad reaction   Coumadin [warfarin Sodium] Other (See Comments)   Hemorhage. Has IVC filter   Lisinopril Cough      Medication List    STOP taking these medications   diclofenac sodium 1 % Gel Commonly known as: Voltaren   fluticasone 50 MCG/ACT nasal spray Commonly known as: Flonase   levocetirizine 5 MG tablet Commonly known as: Xyzal   valACYclovir 1000 MG tablet Commonly known as: VALTREX     TAKE these medications   Accu-Chek Aviva test strip Generic drug: glucose blood Use to test blood sugars three times a day. Dx code:E11.9   Accu-Chek Softclix Lancets lancets Check blood sugar up to 3 times a day  as instructed   amLODipine 5 MG tablet Commonly known as: NORVASC Take 2 tablets (10 mg total) by mouth at bedtime. What changed: how much to take   aspirin EC 81 MG tablet Take 81 mg by mouth at bedtime. Swallow whole.   BD Pen Needle Nano U/F 32G X 4 MM Misc Generic drug: Insulin Pen Needle USE TO GIVE INSULIN 4 TIMES A DAY   cholecalciferol 25 MCG (1000 UNIT) tablet Commonly known as: VITAMIN D3 Take 1,000 Units by mouth daily.   furosemide 40 MG tablet Commonly known as: LASIX Take 1 tablet (40 mg total) by mouth daily.   guaiFENesin-dextromethorphan 100-10 MG/5ML  syrup Commonly known as: ROBITUSSIN DM Take 5 mLs by mouth every 4 (four) hours as needed for cough.   Lantus SoloStar 100 UNIT/ML Solostar Pen Generic drug: insulin glargine INJECT 35 UNITS INTO SKIN DAILY AT 10 PM What changed:   how much to take  how to take this  when to take this  additional instructions   levothyroxine 100 MCG tablet Commonly known as: SYNTHROID Take 1 tablet (100 mcg total) by mouth daily.   Magnesium 500 MG Tabs Take 500 mg by mouth 2 (two) times daily.   multivitamin with minerals Tabs tablet Take 1 tablet by mouth daily.   mycophenolate 250 MG capsule Commonly known as: CELLCEPT Take 2 capsules (500 mg total) by mouth 2 (two) times daily.   pantoprazole 40 MG tablet Commonly known as: PROTONIX Take 1 tablet (40 mg total) by mouth daily. What changed:   when to take this  additional instructions   pentoxifylline 400 MG CR tablet Commonly known as: TRENTAL Take 400 mg by mouth 2 (two) times daily.   predniSONE 5 MG tablet Commonly known as: DELTASONE Take 5 mg by mouth daily with breakfast.   PreserVision AREDS Caps Take 1 capsule by mouth 2 (two) times daily.   Rivaroxaban 15 MG Tabs tablet Commonly known as: Xarelto Take 1 tablet (15 mg total) by mouth 2 (two) times daily with a meal for 21 days.   rivaroxaban 20 MG Tabs tablet  Commonly known as: Xarelto Take 1 tablet (20 mg total) by mouth daily with supper. Start taking on: March 27, 2020   sulfamethoxazole-trimethoprim 400-80 MG tablet Commonly known as: BACTRIM Take 1 tablet by mouth every Monday, Wednesday, and Friday.   tacrolimus 1 MG capsule Commonly known as: PROGRAF Take 5 capsules (5 mg total) by mouth 2 (two) times daily. Takes 4.5mg  total twice daily What changed:   how much to take  when to take this  additional instructions            Durable Medical Equipment  (From admission, onward)         Start     Ordered   03/06/20 1602  For home  use only DME 4 wheeled rolling walker with seat  Once       Question:  Patient needs a walker to treat with the following condition  Answer:  Shortness of breath   03/06/20 1601          Disposition and follow-up:   [name] was discharged from [facility] in Stable condition.  At the hospital follow up visit please address:  1. Covid-19: no supplemental O2 requirement but sats lower limit normal, she will do covid at home program. Received monoclonal antibodies.  2. RLE DVT: Acute popliteal, peroneal & post. tibial DVT. Hx of DVT w life threatening intrarenal bleeding due to elevated INR 20 on coumadin in 2006, never had DOAC, given permanent IVC filter due to concomitant DVT at that time. Now starting eliquis 15 mg bid for 21 days then 20 mg qd for at least 3 months as provoked in setting of covid. Unclear what cause of 2006 DVT was.  3. AKI on CKD Stage III s/p renal transplant 2019: prerenal, improved with fluids  4. Hypovolemic Hyponatremia: improved with fluid resuscitation   2.  Labs / imaging needed at time of follow-up: BMP, ABI  3.  Pending labs/ test needing follow-up: none  Follow-up Appointments:   [place] Visit Thursday 03/10/20  Hospital Course by problem list: 1. Covid-19 2. RLE DVT 3. AKI on CKD Stage III s/p renal transplant 2019  [name] is a 74yo female with PMH of TIIDM, HTN, PAD, DVT w/ permanent IVC filter placed 2006 due to severe intrarenal bleeding on coumadin leading to renal failure, now s/p renal transplant in 2010 presenting with cough and fever, found to be positive for covid-19. She has not required supplemental oxygen during admission and was treated with monoclonal antibody infusion and fluid resuscitation for AKI, which resolved back to her baseline. Symptoms improved after fluid resuscitation.  She also had small amount of pitting edema in the lower extremities and bilateral cramping which she  stated was not increased from normal. She endorsed a history of PAD but there were no ABI's on record. ABI and DVT US ordered. DVT US showed popliteal vein DVT, thus tech was unable to do ABIs but duplex of arteries showed no stenosis and normal wave forms bilaterally.  She has a history of DVT in 2006 for which she was placed on coumadin. She subsequently returned to the hospital with an INR of 20 due to the warfarin and intrarenal hemorrhage, which led to renal failure. She had the DVT at that time as well so she had a permanent IVC filter placed. As her hemorrhage was secondary to elevated INR, and no DOACs were available at that time, she is being started on Xarelto for her  current lower extremity DVT. She will follow-up with the IMTS clinic.    Discharge Vitals:   BP (!) 141/71   Pulse 83   Temp 98.6 F (37 C) (Oral)   Resp 19   Ht 5\' 7"  (1.702 m)   SpO2 90%   BMI 35.30 kg/m   Pertinent Labs, Studies, and Procedures:   VAS [place] LE Bilateral RIGHT:  - Findings consistent with acute deep vein thrombosis involving the right  popliteal vein, right posterior tibial veins, and right peroneal veins.  - Patient with acute DVT and Covid-19 with ABI order placed. Lower  extremity arteries were duplexed showing no stenosis, normal waveforms,  and adequate flow into the foot, bilaterally.    LEFT:  - There is no evidence of deep vein thrombosis in the lower extremity.    - Patient with acute DVT and Covid-19 with ABI order placed. Lower  extremity arteries were duplexed showing no stenosis, normal waveforms,  and adequate flow into the foot, bilaterally.   CXR 03/04/20 Mild cardiac enlargement. Shallow inspiration with infiltration or atelectasis in the lung bases. Probable emphysematous changes in the upper lungs. No focal consolidation. No pleural effusions. Mediastinal contours appear intact. Calcified aorta.  IMPRESSION: Shallow inspiration with infiltration or atelectasis in the  lung bases. Electronically Signed   By: Lucienne Capers M.D.   On: 03/04/2020 23:20  BMP Latest Ref Rng & Units 03/06/2020 03/05/2020 03/04/2020  Glucose 70 - 99 mg/dL 157(H) 105(H) 137(H)  BUN 8 - 23 mg/dL 18 27(H) 28(H)  Creatinine 0.44 - 1.00 mg/dL 1.47(H) 2.05(H) 2.13(H)  BUN/Creat Ratio 12 - 28 - - -  Sodium 135 - 145 mmol/L 131(L) 129(L) 133(L)  Potassium 3.5 - 5.1 mmol/L 4.5 4.0 4.3  Chloride 98 - 111 mmol/L 97(L) 94(L) 95(L)  CO2 22 - 32 mmol/L 23 25 24   Calcium 8.9 - 10.3 mg/dL 8.9 8.8(L) 9.9    Discharge Instructions:  Please note the following changes to your medications:   Please increase amlodipine to 10 mg per day - two tablets per day  For your lower extremity DVT (blood clot):  Xarelto 15 mg - take one tablet in the morning and one tablet in the evening for 21 days  THEN  Xarelto 20 mg - take one tablet daily in the evening with dinner   Please wait two days before restarting your lasix 40 mg due to acute kidney injury.    You were hospitalized for covid-19. Thank you for allowing [place] to be part of your care.   We arranged for you to follow up at:   [facility] They will call to schedule a follow-up telehealth visit for this week.  Please continue to quarantine until October 1st under guidelines by the CDC  Please call our clinic if you have any questions or concerns, we may be able to help and keep you from a long and expensive emergency room wait. Our clinic and after hours phone number is [phone], the best time to call is Monday through Friday 9 am to 4 pm but there is always someone available 24/7 if you have an emergency. If you need medication refills please notify your pharmacy one week in advance and they will send [place] a request.    Signed: Marty Heck, DO 03/06/2020, 5:45 PM   Pager: [phone]

## 2020-03-05 NOTE — H&P (Addendum)
Date: 03/05/2020               Patient Name:  [name] MRN: [id number]  DOB: 12-Feb-1946 Age / Sex: 74 y.o., female   PCP: Axel Filler, MD         Medical Service: Internal Medicine Teaching Service         Attending Physician: Dr. Jimmye Norman Elaina Pattee, MD    First Contact: Dr. Sharon Seller Pager: [phone]            After Hours (After 5p/  First Contact Pager: [phone]  weekends / holidays): Second Contact Pager: [phone]   Chief Complaint: Sore throat  History of Present Illness: [name] is a 74 year old female with a history of insulin-dependent diabetes, hypertension, DVT w/IVC filter, infrarenal abdominal aortic aneurysm (stable at 3.8cm)  hypothyroidism, CKD 3 with history of renal transplant in 2010 presenting with nasal congestion and sore throat. Patient endorses that this has been present for approximately 1 month and was previously evaluated for this in the clinic for which she was prescribed Flonase. Patient reports some relief with this; however, notes about 3 to 4 days of worsening sore throat and nasal congestion with postnasal drip. She also endorses a nonproductive cough and fever with T-max 100.3 this morning. She also endorses food aversion; however, denies any dysguesia. Patient denies any headache, myalgias, vision changes, chest pain, shortness of breath, rash, abdominal pain, nausea/vomiting, diarrhea, or focal weakness.  Patient has received both doses of the COVID-19 vaccination with last dose in April 2021. She denies any recent sick contacts that she is aware of.  Meds:  Current Meds  Medication Sig  . amLODipine (NORVASC) 5 MG tablet Take 5 mg by mouth at bedtime.  Marland Kitchen aspirin EC 81 MG tablet Take 81 mg by mouth at bedtime. Swallow whole.  . cholecalciferol (VITAMIN D3) 25 MCG (1000 UNIT) tablet Take 1,000 Units by mouth daily.  . furosemide (LASIX) 40 MG tablet Take 1 tablet (40 mg total) by mouth daily.  . Insulin Glargine  (LANTUS SOLOSTAR) 100 UNIT/ML Solostar Pen INJECT 35 UNITS INTO SKIN DAILY AT 10 PM (Patient taking differently: Inject 35 Units into the skin at bedtime. )  . levothyroxine (SYNTHROID) 100 MCG tablet Take 1 tablet (100 mcg total) by mouth daily.  . Magnesium 500 MG TABS Take 500 mg by mouth 2 (two) times daily.  . Multiple Vitamin (MULTIVITAMIN WITH MINERALS) TABS tablet Take 1 tablet by mouth daily.  . Multiple Vitamins-Minerals (PRESERVISION AREDS) CAPS Take 1 capsule by mouth 2 (two) times daily.  . mycophenolate (CELLCEPT) 250 MG capsule Take 2 capsules (500 mg total) by mouth 2 (two) times daily.  . pantoprazole (PROTONIX) 40 MG tablet Take 1 tablet (40 mg total) by mouth daily. (Patient taking differently: Take 40 mg by mouth See admin instructions. Take one tablet (40 mg) by mouth every morning, may take a 2nd tablet (40 mg) as needed for acid reflux/pain)  . pentoxifylline (TRENTAL) 400 MG CR tablet Take 400 mg by mouth 2 (two) times daily.  . predniSONE (DELTASONE) 5 MG tablet Take 5 mg by mouth daily with breakfast.   . sulfamethoxazole-trimethoprim (BACTRIM,SEPTRA) 400-80 MG per tablet Take 1 tablet by mouth every Monday, Wednesday, and Friday.  . tacrolimus (PROGRAF) 1 MG capsule Take 5 capsules (5 mg total) by mouth 2 (two) times daily. Takes 4.5mg  total twice daily (Patient taking differently: Take 4-5 mg by mouth See admin instructions. Take 5 capsules (  5 mg) by mouth every morning and take 4 capsules (4 mg) every night)    Allergies: Allergies as of 03/04/2020 - Review Complete 03/04/2020  Allergen Reaction Noted  . Hectorol [doxercalciferol] Anaphylaxis and Shortness Of Breath 07/14/2011  . Latex Anaphylaxis and Other (See Comments) 09/09/2018  . Other Rash 08/05/2007  . Vitamin d analogs Anaphylaxis and Shortness Of Breath 05/14/2014  . Coumadin [warfarin sodium] Other (See Comments) 07/14/2011  . Lisinopril Cough 06/22/2014   Past Medical History:  Diagnosis Date  . AAA  (abdominal aortic aneurysm) (HCC)    3.8 cm Infrarenal managed at [facility]  . Clotting disorder ([place])    PE, IVC filter placed at [facility]  . Diabetes mellitus   . Hypertension   . Hypothyroidism   . Morbid obesity (HCC)    BMI 34  . Renal transplant recipient    [facility]    Family History:  No family history on file.   Social History:  No current tobacco, alcohol or illicit drug use.  Independent in ADLs.   Review of Systems: A complete ROS was negative except as per HPI.   Physical Exam: Blood pressure (!) 154/87, pulse 87, temperature 99.7 F (37.6 C), temperature source Oral, resp. rate 19, height 5\' 7"  (1.702 m), SpO2 93 %. Physical Exam  Constitutional: Appears well-developed and well-nourished. No distress.  HENT: Normocephalic and atraumatic, EOMI, conjunctiva normal, moist mucous membranes Cardiovascular: Normal rate, regular rhythm, S1 and S2 present, no murmurs, rubs, gallops.  Distal pulses intact Respiratory: No respiratory distress, no accessory muscle use.  Effort is normal.  Bibasilar crackles appreciated on auscultation GI: Nondistended, soft, nontender to palpation, normal active bowel sounds Musculoskeletal: Normal bulk and tone.  Trace bilateral lower extremity pitting edema Neurological: Is alert and oriented x4, no apparent focal deficits noted. Skin: Warm and dry.  No rash, erythema, lesions noted. Psychiatric: Normal mood and affect. Behavior is normal. Judgment and thought content normal.   EKG: pending CXR: personally reviewed my interpretation is bibasilar infiltrates  CBC Latest Ref Rng & Units 03/04/2020 06/01/2014 05/20/2014  WBC 4.0 - 10.5 K/uL 7.3 7.6 7.3  Hemoglobin 12.0 - 15.0 g/dL 12.3 11.9(L) 10.7(L)  Hematocrit 36 - 46 % 39.1 37.8 32.0(L)  Platelets 150 - 400 K/uL 293 273 148(L)   BMP Latest Ref Rng & Units 03/04/2020 04/14/2018 09/22/2014  Glucose 70 - 99 mg/dL 137(H) 78 117(H)  BUN 8 - 23 mg/dL 28(H) 34(H) 29(H)  Creatinine 0.44 -  1.00 mg/dL 2.13(H) 1.52(H) 1.53(H)  BUN/Creat Ratio 12 - 28 - 22 -  Sodium 135 - 145 mmol/L 133(L) 139 136  Potassium 3.5 - 5.1 mmol/L 4.3 4.4 4.7  Chloride 98 - 111 mmol/L 95(L) 101 100  CO2 22 - 32 mmol/L 24 20 27   Calcium 8.9 - 10.3 mg/dL 9.9 10.0 10.2    Assessment & Plan by Problem: Active Problems:   Acute renal failure superimposed on stage 3b chronic kidney disease (HCC)   Acute on chronic renal failure [facility])  Ms. Jaidah Lomax is a 74 year old female with a history of hypertension, hypothyroidism, chronic kidney disease 3 with history of renal transplant, admitted for evaluation of acute on chronic renal injury.  Found to be COVID-19 positive  AKI on CKD3: Patient presenting with acute on chronic renal failure in setting of decreased oral intake.  Patient's serum creatinine up to 2.3 from baseline of 1.5.  Possibly from decreased p.o. intake in setting of COVID-19.  Remainder of electrolytes currently stable.  She received a 500 cc bolus in the ED. -We will give small 500 cc bolus - Follow-up BMP in a.m. -Avoid nephrotoxic agents  COVID-19  Patient with approximately 3 to 4 days of nonproductive cough, sore throat, nasal congestion and T-max 100.5 this morning.  Found to be Covid positive.  She is on room air and maintaining.  No known sick contacts.  She has received 1 dose of the monoclonal antibodies.  On examination, she has bibasilar crackles.  Elevated CRP, ferritin, D-dimer, fibrinogen.  No significant leukocytosis noted.  However no, oxygen requirement at this time. She is fully vaccinated against COVID-19. -Continue monoclonal antibody while inpatient; consider setting up as outpatient for 5-day completion therapy -IV Solu-Medrol as needed for hypersensitivity reaction -Tussionex as needed  Hx of renal transplant: Patient with history of end-stage renal disease in setting of uncontrolled hypertension is status post deceased donor kidney transplant in March 2010.   She is followed by nephrology at [facility].  She is on Prograf 5 mg twice daily, prednisone 5 mg daily, CellCept 5 mg twice daily -Continue immunosuppressive regimen -On Bactrim indefinitely 400-80 Monday Wednesday Friday for infection prophylaxis  Diabetes mellitus: Hemoglobin A1c 6.7 in June 2020.  Patient is on Lantus 15 units at home. -Repeat A1c -Continue Lantus 15 units daily + SSI  Hypertension: Patient is on amlodipine 5 mg daily.  Normotensive on my examination -Continue amlodipine 5 mg daily  Hypothyroidism: -Continue Synthroid 100 mcg every morning  Hx of DVT: IVC filter since 2006 due to history of severe bleeding on coumadin. In setting of COVID, patient in hypercoagulable state. She is on prophylactic Lovenox dosing.  - Continue to monitor   Diet: Heart healthy/carb modified DVT Prophylaxis: Lovenox Code status: DNR/DNI  Dispo: Admit patient to Observation with expected length of stay less than 2 midnights.  Signed: Harvie Heck, MD  Internal Medicine, PGY-2 03/05/2020, 2:09 AM  Pager: [phone] After 5pm on weekdays and 1pm on weekends: On Call pager: [phone]

## 2020-03-05 NOTE — ED Notes (Signed)
[place] (pt daughter) called to get a update from nurse. Please call [phone]

## 2020-03-05 NOTE — Progress Notes (Addendum)
Subjective:   She is not feeling well this morning. She feels feverish with chills and weak, similar to why she came into the hospital. Difficulty getting out of bed to use the restroom. She denies shortness of breath but does have cough.  She has no dysuria, difficulty with urination, abdominal pain, nausea, or diarrhea  She is having bilateral lower extremity cramping, which she states is chronic from arterial disease. Usually elevating her legs seems to help.   Objective:  Vital signs in last 24 hours: Vitals:   03/05/20 0500 03/05/20 0600 03/05/20 0615 03/05/20 0700  BP: (!) 168/87 (!) 174/83  (!) 181/85  Pulse: 80 88 87 89  Resp: (!) 24 (!) 24 (!) 27 (!) 25  Temp:      TempSrc:      SpO2: 93% 95% 100% 91%  Height:        Supplemental Oxygen: 93% on RA  Constitution: NAD, appears stated age Cardio: RRR, no m/r/g, trace edema LLE, none on right, pedal pulse weak but palpable on left, +2 on right. Left toe cool but good capillary refill Respiratory: lower lobe crackles, non-labored breathing, no w/r/r Abdominal: NTTP, soft, non-distended MSK: moving all extremities Neuro: normal affect, [name]&ox3 Skin: diaphoretic, otherwise c/d/i   Assessment/Plan:  Active Problems:   Acute renal failure superimposed on stage 3b chronic kidney disease ([place])   Acute on chronic renal failure ([place])  74yo female with PMH of TIIDM, HTN, DVT w/ivc filter, stable infrarenal aortic aneurysm, hypothyroidism, CKD stage III w/history of renal transplant in 2010 who presented with sore throat and fever, found to be covid positive.   COVID-19 Admitted overnight with symptoms but no supplemental oxygen requirement but appearing weak, deconditioned, febrile to 102, and acutely ill on exam. Inflammatory markers slihgtly increased today. Received her vaccination of April this year. S/p regen-cov mab yesterday evening.   - robitussen q4h prn, tessalon perles prn  - IS, flutter valve - PT/OT  - tylenol  prn fever - on prednisone 5 mg qd - vitals stable, no indication for stress dose steroids at this time - f/u blood cultures  AKI on CKD Stage III s/p renal transplant 2010 Baseline creatine appears to be ~1.7. AKI likely prerenal as patient is presenting in setting of decreased po intake 2/2 covid-19. She received 500 cc bolus at admission.   - cont. IVF  - UA, urine pr/cr ratio - continue prograf, myfortic, and prednisone 5 mg qd - hold lasix 40 qd - cont. Prophylactic bactrim  Hyponatremia Presented at 133, down to 129 this morning. She does not appear overtly hypovolemic, will obtain labs for work-up of hyponatremia   - f/u urine Na, urine osmolality  TIIDM Home meds include lantus 35U qhs. Glucose 98 this morning.   - cont. lantus 15U, may need to decrease, monitor glucose today.   PAD/PVD? Leg Cramps Hx of DVT with IVC filter On pentoxifylline but no notes in chart. Patient endorses lower extremity cramps that are chronic secondary to arterial disease. She has weaker pulse on left with slower capillary refill. I do not see previous ABI in chart. She also has [name] history of DVT and PE with IVC filter placed due to bleeding on coumadin. Unable to find information on which side previous DVT was on. She has trace edema LLE, which could be explained by prior DVT, but she is not sure which side this was on either.   - ABIs, DVT US - cont. Pentoxifylline  HTN  -  holding lasix for AKI - increase norvasc to 10 mg  VTE: lovenox IVF: LR Diet: CM/HH Code: full   Dispo: Anticipated discharge in approximately 1-2 days.   Martha Albee A, DO 03/05/2020, 7:17 AM Pager: [phone] After 5pm on weekdays and 1pm on weekends: On Call Pager: [phone]

## 2020-03-05 NOTE — ED Notes (Signed)
  Patient ambulated in room with pulse oximetry.  Patient SPO2 dropped from 94-95% to 90%.  Patient tolerated well.

## 2020-03-06 ENCOUNTER — Inpatient Hospital Stay (HOSPITAL_COMMUNITY): Payer: Medicare Other

## 2020-03-06 DIAGNOSIS — U071 COVID-19: Secondary | ICD-10-CM

## 2020-03-06 DIAGNOSIS — R609 Edema, unspecified: Secondary | ICD-10-CM

## 2020-03-06 LAB — PROTEIN / CREATININE RATIO, URINE
Creatinine, Urine: 96.06 mg/dL
Protein Creatinine Ratio: 0.41 mg/mg{Cre} — ABNORMAL HIGH (ref 0.00–0.15)
Total Protein, Urine: 39 mg/dL

## 2020-03-06 LAB — COMPREHENSIVE METABOLIC PANEL
ALT: 27 U/L (ref 0–44)
AST: 49 U/L — ABNORMAL HIGH (ref 15–41)
Albumin: 3.3 g/dL — ABNORMAL LOW (ref 3.5–5.0)
Alkaline Phosphatase: 85 U/L (ref 38–126)
Anion gap: 11 (ref 5–15)
BUN: 18 mg/dL (ref 8–23)
CO2: 23 mmol/L (ref 22–32)
Calcium: 8.9 mg/dL (ref 8.9–10.3)
Chloride: 97 mmol/L — ABNORMAL LOW (ref 98–111)
Creatinine, Ser: 1.47 mg/dL — ABNORMAL HIGH (ref 0.44–1.00)
GFR calc Af Amer: 41 mL/min — ABNORMAL LOW (ref >60)
GFR calc non Af Amer: 35 mL/min — ABNORMAL LOW (ref >60)
Glucose, Bld: 157 mg/dL — ABNORMAL HIGH (ref 70–99)
Potassium: 4.5 mmol/L (ref 3.5–5.1)
Sodium: 131 mmol/L — ABNORMAL LOW (ref 135–145)
Total Bilirubin: 0.8 mg/dL (ref 0.3–1.2)
Total Protein: 6.8 g/dL (ref 6.5–8.1)

## 2020-03-06 LAB — CBG MONITORING, ED
Glucose-Capillary: 103 mg/dL — ABNORMAL HIGH (ref 70–99)
Glucose-Capillary: 172 mg/dL — ABNORMAL HIGH (ref 70–99)
Glucose-Capillary: 127 mg/dL — ABNORMAL HIGH (ref 70–99)

## 2020-03-06 LAB — MAGNESIUM: Magnesium: 1.9 mg/dL (ref 1.7–2.4)

## 2020-03-06 LAB — CBC WITH DIFFERENTIAL/PLATELET
Abs Immature Granulocytes: 0.02 10*3/uL (ref 0.00–0.07)
Basophils Absolute: 0 10*3/uL (ref 0.0–0.1)
Basophils Relative: 0 %
Eosinophils Absolute: 0 10*3/uL (ref 0.0–0.5)
Eosinophils Relative: 0 %
HCT: 36.3 % (ref 36.0–46.0)
Hemoglobin: 11.2 g/dL — ABNORMAL LOW (ref 12.0–15.0)
Immature Granulocytes: 0 %
Lymphocytes Relative: 22 %
Lymphs Abs: 1.9 10*3/uL (ref 0.7–4.0)
MCH: 28.9 pg (ref 26.0–34.0)
MCHC: 30.9 g/dL (ref 30.0–36.0)
MCV: 93.6 fL (ref 80.0–100.0)
Monocytes Absolute: 0.4 10*3/uL (ref 0.1–1.0)
Monocytes Relative: 5 %
Neutro Abs: 6 10*3/uL (ref 1.7–7.7)
Neutrophils Relative %: 73 %
Platelets: 204 10*3/uL (ref 150–400)
RBC: 3.88 MIL/uL (ref 3.87–5.11)
RDW: 15.7 % — ABNORMAL HIGH (ref 11.5–15.5)
WBC: 8.4 10*3/uL (ref 4.0–10.5)
nRBC: 0 % (ref 0.0–0.2)

## 2020-03-06 LAB — OSMOLALITY, URINE: Osmolality, Ur: 399 mOsm/kg (ref 300–900)

## 2020-03-06 LAB — PHOSPHORUS: Phosphorus: 2.5 mg/dL (ref 2.5–4.6)

## 2020-03-06 LAB — C-REACTIVE PROTEIN: CRP: 20.2 mg/dL — ABNORMAL HIGH (ref <1.0)

## 2020-03-06 LAB — FERRITIN: Ferritin: 1116 ng/mL — ABNORMAL HIGH (ref 11–307)

## 2020-03-06 LAB — SODIUM, URINE, RANDOM: Sodium, Ur: 42 mmol/L

## 2020-03-06 MED ORDER — ONDANSETRON HCL 4 MG/2ML IJ SOLN
4.0000 mg | Freq: Once | INTRAMUSCULAR | Status: AC
  Administered 2020-03-06: 4 mg via INTRAVENOUS
  Filled 2020-03-06: qty 2

## 2020-03-06 MED ORDER — RIVAROXABAN 15 MG PO TABS: 15.0000 mg | ORAL_TABLET | Freq: Two times a day (BID) | ORAL | 0 refills | Status: DC

## 2020-03-06 MED ORDER — RIVAROXABAN 20 MG PO TABS: 20.0000 mg | ORAL_TABLET | Freq: Every day | ORAL | 0 refills | Status: DC

## 2020-03-06 MED ORDER — GUAIFENESIN-DM 100-10 MG/5ML PO SYRP: 5.0000 mL | ORAL_SOLUTION | ORAL | 0 refills | Status: DC | PRN

## 2020-03-06 MED ORDER — AMLODIPINE BESYLATE 5 MG PO TABS: 10.0000 mg | ORAL_TABLET | Freq: Every day | ORAL | 3 refills | Status: DC

## 2020-03-06 MED ORDER — ALBUTEROL SULFATE (2.5 MG/3ML) 0.083% IN NEBU: 2.5000 mg | INHALATION_SOLUTION | Freq: Four times a day (QID) | RESPIRATORY_TRACT | Status: DC

## 2020-03-06 MED ORDER — RIVAROXABAN 20 MG PO TABS
20.0000 mg | ORAL_TABLET | Freq: Every day | ORAL | Status: AC
  Administered 2020-03-06: 20 mg via ORAL
  Filled 2020-03-06: qty 1

## 2020-03-06 NOTE — ED Notes (Signed)
Bedside commode placed in pt's room at this time. Pt does not want to use purewick or use a bedpan in the stretcher. Asked pt to use call bell when she is ready to get OOB to bedside commode.

## 2020-03-06 NOTE — ED Notes (Signed)
[phone], [place], daughter called for an update.

## 2020-03-06 NOTE — ED Notes (Signed)
Pt stood up to help with leg cramps. Tolerated well. Pt placed back in bed and purewick is in place.

## 2020-03-06 NOTE — ED Notes (Signed)
Unsuccessful blood draw for morning labs. Will alert phlebotomy.

## 2020-03-06 NOTE — TOC Transition Note (Signed)
Transition of Care [facility]) - CM/SW Discharge Note   Patient Details  Name: [name] MRN: [id number] Date of Birth: [birth year]  Transition of Care [facility]) CM/SW Contact:  Laurena Slimmer, RN Phone Number: 03/06/2020, 6:50 PM   Clinical Narrative:    ED CM received consult from ED RN patient is being discharged from the ED patient was waiting on inpatient bed in the ED.  Patient is Covid Positive and lives at home with son [name] [phone].  CM spoke with son to discuss discharge recommendations  For [facility] to [place] and [place] services, he is agreeable. CM offered choice as per CMS compare quality list, [facility] was selected, Verda Cumins liaison contacted and referral was accepted. CM also contacted Webb Silversmith at [facility] with referral and faxed the referral in [phone].  Updated RN on [place]. Patient will be transported home by PTAR due to +Covid diagnosis.  Final next level of care: [place] Barriers to Discharge: No Barriers Identified   Patient Goals and CMS Choice Patient states their goals for this hospitalization and ongoing recovery are:: "To get Better" CMS Medicare.gov Compare Post Acute Care list provided to:: Other (Comment Required) Choice  offered to / list presented to : Adult [place] [phone]                       Discharge Plan and Services   Discharge Planning Services: Other - See comment Post Acute Care Choice: Durable Medical Equipment, Home Health          DME Arranged: Gilford Rile DME Agency: [facility]       HH Arranged: PT, OT ([facility] to Home Remote health Covid-19 Program) [place] Agency: [place] Date [facility] Agency Contacted: 03/06/20 Time HH Agency Contacted: 1800 Representative spoke with at [place]: Malachy Mood at [facility] Interventions     Readmission Risk Interventions No flowsheet data found.

## 2020-03-06 NOTE — Progress Notes (Signed)
° °  Subjective:   She is feeling much better today. She feels stiff and like she wants to move around. Seen sitting up at bedside, about to eat breakfast. She denies dysuria and has having no difficulty with urination.   Objective:  Vital signs in last 24 hours: Vitals:   03/06/20 0116 03/06/20 0200 03/06/20 0400 03/06/20 0600  BP:  (!) 151/78 (!) 150/81 (!) 141/71  Pulse:  82 81 83  Resp:  19 19 19   Temp: 98.6 F (37 C)     TempSrc: Oral     SpO2:  94% 94% 90%  Height:        Supplemental Oxygen: 93% RA  Constitution: NAD, appears stated age Cardio: RRR, no m/r/g, no LE edema Respiratory: wheezing left lower lobe, non-labored breathing, no w/r/r Neuro: normal affect, a&ox3 Skin: c/d/i    Assessment/Plan:  Active Problems:   Acute renal failure superimposed on stage 3b chronic kidney disease (HCC)   Acute on chronic renal failure ([place])  74yo female with PMH of TIIDM, HTN, DVT w/ivc filter, stable infrarenal aortic aneurysm, hypothyroidism, CKD stage III w/history of renal transplant in 2010 who presented with sore throat and fever, found to be covid positive.   COVID-19 Symptoms improved today although saturations slightly lower.  S/p regen-cov mab 9/10. She may be able to go home today pending labs and PT eval, will also have her ambulate with O2 sats.  - ambulate with sats  - robitussen q4h prn, tessalon perles prn  - albuterol q6h with wheezing - IS, flutter valve - f/u PT  - tylenol prn fever - f/u blood cultures  AKI on CKD Stage III s/p renal transplant 2010 AKI at admission, likely prerenal. Received small bolus and fluids. UA normal. Ur pr/cr benign.     - f/u am labs - continue prograf, myfortic, and prednisone 5 mg qd - hold lasix 40 qd - cont. Prophylactic bactrim  Hyponatremia Presented at 133, down to 129 yesterday morning. Ur borderline at 42 after also receiving fluids, Ur osm 399. Still possibly secondary to hypovolemic hyponatremia vs. Needing  increased glucocorticoids with acute illness.   - f/u am Na  TIIDM Home meds include lantus 35U qhs. Glucose 98 this morning.   - cont. lantus 15U, may need to decrease, monitor glucose today.   PAD/PVD? Leg Cramps Hx of DVT with IVC filter On pentoxifylline but no notes in chart. Patient endorses lower extremity cramps that are chronic secondary to arterial disease. She has weaker pulse on left with slower capillary refill. No previous ABI in chart. DVT US pending with LLE mild pitting edema and uncertain which leg previously had DVT. She is unable to be on anticoagulation due to history of bleeding.   - ABIs, DVT US - cont. Pentoxifylline  HTN  - holding lasix for AKI - continue norvasc 10 mg  VTE: lovenox IVF: LR Diet: CM/HH Code: full   Dispo: Anticipated discharge in approximately today or tomorrow.   Danyel Tobey A, DO 03/06/2020, 8:16 AM Pager: [phone] After 5pm on weekdays and 1pm on weekends: On Call Pager: [phone]

## 2020-03-06 NOTE — ED Notes (Signed)
Pt req nausea medication due to not feeling well. Provider has been made aware. Awaiting orders.

## 2020-03-06 NOTE — ED Notes (Signed)
Lab states there are not enough phlebotomists at this time to draw morning labs. Will attempt after pt has drank some water; will ask other RN.

## 2020-03-06 NOTE — Progress Notes (Addendum)
VASCULAR LAB    Bilateral lower extremity venous duplex has been performed.  See CV proc for preliminary results.  Messaged Dr. Sharon Seller with results  [name], [name], RVT 03/06/2020, 12:11 PM

## 2020-03-06 NOTE — ED Notes (Signed)
Pt received inpatient bed placement. This RN notified Stanton Kidney in Patient placement that pt is being discharged from the ED.

## 2020-03-06 NOTE — Discharge Instructions (Addendum)
Please note the following changes to your medications:   Please increase amlodipine to 10 mg per day - two tablets per day  For your lower extremity DVT (blood clot):  Xarelto 15 mg - take one tablet in the morning and one tablet in the evening for 21 days  THEN  Xarelto 20 mg - take one tablet daily in the evening with dinner   Please wait two days before restarting your lasix 40 mg due to acute kidney injury.    You were hospitalized for covid-19. Thank you for allowing [place] to be part of your care.   We arranged for you to follow up at:   [facility] They will call to schedule a follow-up telehealth visit for this week.  Please continue to quarantine until October 1st under guidelines by the CDC  Please call our clinic if you have any questions or concerns, we may be able to help and keep you from a long and expensive emergency room wait. Our clinic and after hours phone number is [phone], the best time to call is Monday through Friday 9 am to 4 pm but there is always someone available 24/7 if you have an emergency. If you need medication refills please notify your pharmacy one week in advance and they will send [place] a request.

## 2020-03-06 NOTE — TOC Initial Note (Signed)
Transition of Care [facility]) - Initial/Assessment Note    Patient Details  Name: [name] MRN: [id number] Date of Birth: 11-Jun-1946  [facility]) CM/SW Contact:    Laurena Slimmer, RN Phone Number: 03/06/2020, 6:35 PM  Clinical Narrative:                   Expected Discharge Plan: [place] (Remote health -[facility] to Home Program) Barriers to Discharge: No Barriers Identified   Patient Goals and CMS Choice Patient states their goals for this hospitalization and ongoing recovery are:: "To get Better" CMS Medicare.gov Compare Post Acute Care list provided to:: Patient Choice offered to / list presented to : Adult Children  Expected Discharge Plan and Services Expected Discharge Plan: [place] (Remote health -[facility] to Home Program)   Discharge Planning Services: Other - See comment Post Acute Care Choice: Durable Medical Equipment, Home Health Living arrangements for the past 2 months: Apartment Expected Discharge Date: 03/06/20               DME Arranged: Gilford Rile DME Agency: [facility]       HH Arranged: PT, OT ([facility] Remote health Covid-19 Program) [place]: [place] Date [facility] Agency Contacted: 03/06/20 Time HH Agency Contacted: 65 Representative spoke with at [place]: Malachy Mood at [place] Arrangements/Services Living arrangements for the past 2 months: [place] with:: Self and Son  Patient language and need for interpreter reviewed:: No                 Activities of Daily Living      Permission Sought/Granted                  Emotional Assessment       Orientation: : Oriented to Self, Oriented to Place, Oriented to  Time, Oriented to Situation   Psych Involvement: No (comment)  Admission diagnosis:  Acute renal failure superimposed on stage 3b chronic kidney disease ([place]) [N17.9, N18.32] Acute on chronic renal  failure ([place]) [N17.9, N18.9] Patient Active Problem List   Diagnosis Date Noted  . Acute renal failure superimposed on stage 3b chronic kidney disease ([place]) 03/04/2020  . Acute on chronic renal failure ([place]) 03/04/2020  . Benign skin lesion of lower back 11/10/2019  . Conductive hearing loss 12/08/2018  . Osteoarthritis cervical spine 04/14/2018  . 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 Lipodermatosclerosis 05/10/2015  . Health care maintenance 06/08/2014  . Hypothyroidism 06/01/2014  . Hypertension 06/01/2014  . Hepatic steatosis 08/06/2013  . Diabetic neuropathy ([place]) 05/08/2013  . History of kidney transplant 05/08/2013  . Hyperlipidemia associated with type 2 diabetes mellitus ([place]) 07/30/2011  . AAA (abdominal aortic aneurysm) without rupture ([place]) 07/09/2011  . 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 [place] [address] Phone: [phone] Fax: [phone]  [place], [place] [place] [address] Phone: [phone] Fax: [phone]     Social Determinants of Health (SDOH) Interventions  Readmission Risk Interventions No flowsheet data found.

## 2020-03-06 NOTE — ED Notes (Signed)
Pt to home via PTAR. Discharge instructions discussed with pt. Paperwork with [employer] staff.

## 2020-03-06 NOTE — ED Notes (Signed)
Pt reported that LUE has old dialysis access and was told by previous provider to not use that arm for BP, blood draws, or IV sticks indefinitely. Pt requested to have PIV removed from LT hand. PIV was removed.

## 2020-03-06 NOTE — Evaluation (Signed)
Physical Therapy Evaluation Patient Details Name: [name] MRN: [id number] DOB: 17-Jan-1946 Today's Date: 03/06/2020   History of Present Illness  [name] is a 74 year old female with a history of hypertension, hypothyroidism, chronic kidney disease 3 with history of renal transplant, admitted for evaluation of acute on chronic renal injury.  Found to be COVID-19 positive  Clinical Impression   Pt admitted with above diagnosis. Comes from home where she lives with her son in a townhome with a level entry and a flight of steps to reach her bedroom and bathroom; Independent at baseline;  Presents to PT with mildly unsteady gait and decr functional capacity; Walked on room air; O2 sats ranged as low as 84% on two occasions, came back to 90s with rest and controlled breathing; did not require supplemental O2 to help incr O2 sats back to 90s;  Pt currently with functional limitations due to the deficits listed below (see PT Problem List). Pt will benefit from skilled PT to increase their independence and safety with mobility to allow discharge to the venue listed below.       Follow Up Recommendations Home health PT;Supervision - Intermittent    Equipment Recommendations  Other (comment) (rollator RW)    Recommendations for Other Services       Precautions / Restrictions Precautions Precautions: Other (comment) (Fall risk present, but mild) Precaution Comments: watch O2 sats      Mobility  Bed Mobility Overal bed mobility: Needs Assistance Bed Mobility: Supine to Sit     Supine to sit: Min assist     General bed mobility comments: min handheld assist to pull to sit  Transfers Overall transfer level: Needs assistance Equipment used: 1 person hand held assist Transfers: Sit to/from Stand Sit to Stand: Min assist         General transfer comment: Good rise; min assist to steady at initial stand  Ambulation/Gait Ambulation/Gait assistance: Min guard;Min  assist Gait Distance (Feet): 80 Feet (laps in room with one standing rest break) Assistive device: 1 person hand held assist Gait Pattern/deviations: Step-through pattern     General Gait Details: initially needing steadying assist, but that got better as she walked more; cues to self-monitor for activity tolerance, and for controlled breathing when O2 sats decr  Stairs            Wheelchair Mobility    Modified Rankin (Stroke Patients Only)       Balance Overall balance assessment: Needs assistance   Sitting balance-Leahy Scale: Good       Standing balance-Leahy Scale: Fair                               Pertinent Vitals/Pain Pain Assessment: Faces Faces Pain Scale: Hurts little more Pain Location: bil falnks with coughing Pain Descriptors / Indicators: Grimacing Pain Intervention(s): Monitored during session    Home Living Family/patient expects to be discharged to:: Private residence Living Arrangements: Children Available Help at Discharge: Family;Available PRN/intermittently Type of Home: Other(Comment) (Townhome) Home Access: Level entry     Home Layout: Two level;Bed/bath upstairs Home Equipment: None      Prior Function Level of Independence: Independent         Comments: Does not drive; Son assists with cooking     Hand Dominance        Extremity/Trunk Assessment   Upper Extremity Assessment Upper Extremity Assessment: Overall WFL for tasks assessed  Lower Extremity Assessment Lower Extremity Assessment: Generalized weakness       Communication   Communication: No difficulties  Cognition Arousal/Alertness: Awake/alert Behavior During Therapy: WFL for tasks assessed/performed Overall Cognitive Status: Within Functional Limits for tasks assessed                                        General Comments General comments (skin integrity, edema, etc.): Walked on room air; O2 sats ranged as low as 84%  on two occasions, came back to 90s with rest and controlled breathing; did not require supplemental O2     Exercises     Assessment/Plan    PT Assessment Patient needs continued PT services  PT Problem List Decreased strength;Decreased activity tolerance;Decreased knowledge of use of DME;Cardiopulmonary status limiting activity       PT Treatment Interventions DME instruction;Gait training;Stair training;Functional mobility training;Therapeutic activities;Therapeutic exercise;Patient/family education    PT Goals (Current goals can be found in the Care Plan section)  Acute Rehab PT Goals Patient Stated Goal: Hopes to go home soon PT Goal Formulation: With patient Time For Goal Achievement: 03/20/20 Potential to Achieve Goals: Good    Frequency Min 3X/week   Barriers to discharge Other (comment) Flight of steps to reach bedroom/bathroom    Co-evaluation               AM-PAC PT "6 Clicks" Mobility  Outcome Measure Help needed turning from your back to your side while in a flat bed without using bedrails?: None Help needed moving from lying on your back to sitting on the side of a flat bed without using bedrails?: None Help needed moving to and from a bed to a chair (including a wheelchair)?: A Little Help needed standing up from a chair using your arms (e.g., wheelchair or bedside chair)?: A Little Help needed to walk in hospital room?: A Little Help needed climbing 3-5 steps with a railing? : A Little 6 Click Score: 20    End of Session Equipment Utilized During Treatment: Gait belt Activity Tolerance: Patient tolerated treatment well Patient left: in chair;with call bell/phone within reach Nurse Communication: Mobility status PT Visit Diagnosis: Unsteadiness on feet (R26.81);Other abnormalities of gait and mobility (R26.89)    Time: 0017-4944 PT Time Calculation (min) (ACUTE ONLY): 28 min   Charges:   PT Evaluation $PT Eval Moderate Complexity: 1 Mod PT  Treatments $Gait Training: 8-22 mins        Roney Marion, PT  Acute Rehabilitation Services Pager [phone] Office [phone]   Col[place]ta [place] 03/06/2020, 5:09 PM

## 2020-03-06 NOTE — ED Notes (Signed)
Requested synthroid from pharmacy. Awaiting med. Pt assisted to bedside commode by ED Tech.

## 2020-03-07 ENCOUNTER — Telehealth: Payer: Self-pay

## 2020-03-07 ENCOUNTER — Telehealth: Payer: Self-pay | Admitting: Physician Assistant

## 2020-03-07 NOTE — Telephone Encounter (Signed)
Received TC from [place], pharmacist at [facility].  He needs clarification on Xarelto 15mg  (dosage/quantity) RX which was sent yesterday.  Please correct/resend RX via [place] as appropriate.  Will send to Dr. Sharon Seller and red team.   Thank you, SChaplin, RN,BSN

## 2020-03-07 NOTE — Telephone Encounter (Signed)
Discussed with pharmacy at [facility] and she will start the Rx.

## 2020-03-07 NOTE — Telephone Encounter (Signed)
Called to discuss with patient about Covid symptoms and the use of casirivimab/imdevimab, a monoclonal antibody infusion for those with mild to moderate Covid symptoms and at a high risk of hospitalization.  Pt is qualified for this infusion at the [facility] infusion center due to; Specific high risk criteria : Older age (>/= 73 yo), BMI > 25, Diabetes and Cardiovascular disease or hypertension   Message left to call back our hotline [phone].  Angelena Form PA-C  [facility]

## 2020-03-08 ENCOUNTER — Telehealth: Payer: Self-pay | Admitting: Student in an Organized Health Care Education/Training Program

## 2020-03-08 NOTE — Telephone Encounter (Signed)
Mickel Baas from [place] said pt is up and driving, she already has nurses to come out  [phone]

## 2020-03-08 NOTE — Telephone Encounter (Signed)
Returned call to [place], PT with [facility]. States patient is completely independent. Going up and down 14 stairs in home, driving; does not need HH PT. Has Community Nurses helping patient with VS (BP, pulse ox), and breathing exercises. No need for [facility]. Will forward to PCP to make him aware. Hubbard Hartshorn, BSN, RN-BC

## 2020-03-08 NOTE — Telephone Encounter (Signed)
Thanks

## 2020-03-10 ENCOUNTER — Ambulatory Visit (INDEPENDENT_AMBULATORY_CARE_PROVIDER_SITE_OTHER): Payer: Medicare Other | Admitting: Internal Medicine

## 2020-03-10 ENCOUNTER — Other Ambulatory Visit: Payer: Self-pay

## 2020-03-10 DIAGNOSIS — N1831 Chronic kidney disease, stage 3a: Secondary | ICD-10-CM

## 2020-03-10 DIAGNOSIS — U071 COVID-19: Secondary | ICD-10-CM | POA: Diagnosis not present

## 2020-03-10 DIAGNOSIS — I824Y1 Acute embolism and thrombosis of unspecified deep veins of right proximal lower extremity: Secondary | ICD-10-CM

## 2020-03-10 DIAGNOSIS — K5900 Constipation, unspecified: Secondary | ICD-10-CM | POA: Diagnosis not present

## 2020-03-10 DIAGNOSIS — N179 Acute kidney failure, unspecified: Secondary | ICD-10-CM | POA: Diagnosis not present

## 2020-03-10 LAB — CULTURE, BLOOD (ROUTINE X 2)
Culture: NO GROWTH
Culture: NO GROWTH
Special Requests: ADEQUATE
Special Requests: ADEQUATE

## 2020-03-10 MED ORDER — POLYETHYLENE GLYCOL 3350 17 G PO PACK: 17.0000 g | PACK | Freq: Every day | ORAL | 0 refills | Status: AC

## 2020-03-10 NOTE — Assessment & Plan Note (Signed)
She noticed some decreased urination when she first came home but was not taking her lasix due to recent AKI. She restarted her lasix this morning and is urinating more now. She is not having any swelling in her legs, dysuria or difficulty with urination.   - cont. Lasix - f/u October 1st

## 2020-03-10 NOTE — Assessment & Plan Note (Signed)
She started the xarelto and is having no difficulty and is taking this with meals. She has not yet had a BM since she came home and is constipated but will check her stool for blood.  She has a history of prior DVT in 2006, unclear etiology. IVC filter was placed due to renal capsular bleed that led to renal failure and kidney transplant after warfarin caused an INR of 20. Unclear what caused 2006 DVT but would consider this provoked by covid-19.   - continue xarelto 15 mg bid with meals for 21 days, then 20 mg per day, continue for 3-6 months - return precautions given.  - f/u October 1st for labs

## 2020-03-10 NOTE — Assessment & Plan Note (Signed)
She has not had a bowel movement since she came home and would like something for constipation   - miralax ordered

## 2020-03-10 NOTE — Progress Notes (Signed)
   CC: covid-19, DVT  This is a telephone encounter between [name] and [name] on 03/10/2020 for covid-19, DVT. The visit was conducted with the patient located at home and [name] at [facility]. The patient's identity was confirmed using their DOB and current address. The patient has consented to being evaluated through a telephone encounter and understands the associated risks (an examination cannot be done and the patient may need to come in for an appointment) / benefits (allows the patient to remain at home, decreasing exposure to coronavirus). I personally spent 16 minutes on medical discussion.   HPI:  [name] is a 74 y.o. with PMH as below.   Please see A&P for assessment of the patient's acute and chronic medical conditions.   She started the xarelto and is having no difficulty and is taking this with meals. She has not yet had a BM since she came home and is constipated but will check her stool for blood. She did notice some dried blood in her nose from blowing her nose but no nose bleed.  No SOB, she is just coughing a lot but the robitussin helps. She is still having some decreased appetite but is slightly improved since discharge. She denies dizziness, and she has not had any trouble getting around the house. No nausea, vomiting, abdominal pain or diarrhea. She is being following by covid at home and states they are planning to come see her today She noticed some decreased urination when she first came home but was not taking her lasix due to recent AKI. She restarted her lasix this morning and is urinating more now. She is not having any swelling in her legs, dysuria or difficulty with urination.   Past Medical History:  Diagnosis Date  . AAA (abdominal aortic aneurysm) (HCC)    3.8 cm Infrarenal managed at [facility] (1-Rh)  . Clotting disorder ([place])    PE, IVC filter placed at [facility]  . Diabetes mellitus   . Hypertension   . Hypothyroidism   . Morbid  obesity (HCC)    BMI 34  . Renal transplant recipient    [facility]   Review of Systems:    Review of Systems  Constitutional: Negative for chills, fever, malaise/fatigue and weight loss.  Respiratory: Positive for cough and sputum production. Negative for shortness of breath and wheezing.   Cardiovascular: Negative for chest pain and leg swelling.  Gastrointestinal: Positive for constipation. Negative for abdominal pain, blood in stool, diarrhea and nausea.  Genitourinary: Negative for frequency.  Musculoskeletal: Negative for falls and myalgias.  Neurological: Negative for dizziness and weakness.    Assessment & Plan:   See Encounters Tab for problem based charting.  Patient discussed with Dr. Angelia Mould

## 2020-03-10 NOTE — Assessment & Plan Note (Signed)
No SOB, she is just coughing a lot but the robitussin helps. She is still having some decreased appetite but is slightly improved since discharge. She denies dizziness, and she has not had any trouble getting around the house. No nausea, vomiting, abdominal pain or diarrhea. She is being following by covid at home and states they are planning to come see her today   - return precautions given  - f/u October 1st in clinic once restriction lifts

## 2020-03-18 NOTE — Progress Notes (Signed)
[facility] Attending  Case discussed with Dr. Seawell  At the time of the visit.  We reviewed the resident's history and exam and pertinent patient test results.  I agree with the assessment, diagnosis, and plan of care documented in the resident's note.  

## 2020-03-21 ENCOUNTER — Telehealth: Payer: Self-pay | Admitting: Student in an Organized Health Care Education/Training Program

## 2020-03-21 NOTE — Telephone Encounter (Signed)
Pt need to know if the medicine needs to be refill [phone]

## 2020-03-21 NOTE — Telephone Encounter (Signed)
Returned call to patient. States she needs to make f/u appt with Dr. Sharon Seller. Appt given for 03/23/2020 at 2:15. Hubbard Hartshorn, BSN, RN-BC

## 2020-03-21 NOTE — Telephone Encounter (Signed)
Her symptoms during admission were mild. As long as she is not having fever or other increased symptoms she should be fine for scheduled appointment without restriction.

## 2020-03-23 ENCOUNTER — Other Ambulatory Visit: Payer: Self-pay | Admitting: Internal Medicine

## 2020-03-23 ENCOUNTER — Telehealth: Payer: Self-pay

## 2020-03-23 ENCOUNTER — Telehealth: Payer: Self-pay | Admitting: Dietician

## 2020-03-23 ENCOUNTER — Encounter: Payer: Medicare Other | Admitting: Internal Medicine

## 2020-03-23 DIAGNOSIS — I824Y1 Acute embolism and thrombosis of unspecified deep veins of right proximal lower extremity: Secondary | ICD-10-CM

## 2020-03-23 MED ORDER — RIVAROXABAN 20 MG PO TABS: 20.0000 mg | ORAL_TABLET | Freq: Every day | ORAL | 0 refills | Status: DC

## 2020-03-23 NOTE — Telephone Encounter (Signed)
Pt states rivaroxaban (XARELTO) 20 MG TABS tablet is not at the pharmacy. Please call pt back.

## 2020-03-23 NOTE — Telephone Encounter (Signed)
Left voicemail message about diabetes eye exam..

## 2020-03-23 NOTE — Telephone Encounter (Signed)
Yes, that's her start date. Will send the prescription so she can pick it up today.

## 2020-03-23 NOTE — Telephone Encounter (Signed)
Patient notified that Rx resent for #90. She understands to take 1 tab daily with supper after she has completed her current dose.  Patient wants to know if she can receive the shingles vaccine. States there is supposed to be a different shingles vaccine for patients with transplants. Please advise. Hubbard Hartshorn, BSN, RN-BC

## 2020-03-23 NOTE — Telephone Encounter (Signed)
Dr. Sharon Seller, did you mean to send 69 tabs with start date of 03/27/2020?

## 2020-03-28 NOTE — Telephone Encounter (Signed)
Seawell, Laurell Roof, DO  Lajuanna Pompa, Orvis Brill, RN [place],   I looked through the current guidelines, though, and it's not yet recommended for her to get the Shingrex vaccine because there isn't enough research for immunocompromised patients yet. At some point, she may be able to get it, but it looks like more research is needed.   Thanks,   [employer]

## 2020-03-28 NOTE — Telephone Encounter (Signed)
Patient made aware of info below. Hubbard Hartshorn, BSN, RN-BC

## 2020-03-29 ENCOUNTER — Other Ambulatory Visit: Payer: Self-pay

## 2020-03-29 ENCOUNTER — Encounter: Payer: Self-pay | Admitting: Internal Medicine

## 2020-03-29 ENCOUNTER — Ambulatory Visit (INDEPENDENT_AMBULATORY_CARE_PROVIDER_SITE_OTHER): Payer: Medicare Other | Admitting: Internal Medicine

## 2020-03-29 VITALS — BP 122/68 | HR 80 | Temp 98.2°F | Ht 67.0 in | Wt 221.7 lb

## 2020-03-29 DIAGNOSIS — N179 Acute kidney failure, unspecified: Secondary | ICD-10-CM | POA: Diagnosis not present

## 2020-03-29 DIAGNOSIS — I10 Essential (primary) hypertension: Secondary | ICD-10-CM

## 2020-03-29 DIAGNOSIS — I824Y1 Acute embolism and thrombosis of unspecified deep veins of right proximal lower extremity: Secondary | ICD-10-CM | POA: Diagnosis not present

## 2020-03-29 DIAGNOSIS — E1143 Type 2 diabetes mellitus with diabetic autonomic (poly)neuropathy: Secondary | ICD-10-CM | POA: Diagnosis not present

## 2020-03-29 DIAGNOSIS — I129 Hypertensive chronic kidney disease with stage 1 through stage 4 chronic kidney disease, or unspecified chronic kidney disease: Secondary | ICD-10-CM

## 2020-03-29 DIAGNOSIS — N1831 Chronic kidney disease, stage 3a: Secondary | ICD-10-CM

## 2020-03-29 LAB — GLUCOSE, CAPILLARY
Glucose-Capillary: 69 mg/dL — ABNORMAL LOW (ref 70–99)
Glucose-Capillary: 94 mg/dL (ref 70–99)

## 2020-03-29 LAB — POCT GLYCOSYLATED HEMOGLOBIN (HGB A1C): Hemoglobin A1C: 7.2 % — AB (ref 4.0–5.6)

## 2020-03-29 NOTE — Progress Notes (Signed)
CC: Leg pain  HPI: [name] is a 74 y.o. with PMH listed below presenting with complaint of leg pain. Please see problem based assessment and plan for further details.  Past Medical History:  Diagnosis Date  . AAA (abdominal aortic aneurysm) (HCC)    3.8 cm Infrarenal managed at [facility]  . Clotting disorder ([place])    PE, IVC filter placed at [facility]  . Diabetes mellitus   . Hypertension   . Hypothyroidism   . Morbid obesity (HCC)    BMI 34  . Renal transplant recipient    [facility]   Review of Systems: Review of Systems  Constitutional: Negative for chills, fever and malaise/fatigue.  Respiratory: Negative for shortness of breath.   Cardiovascular: Negative for chest pain.  Gastrointestinal: Negative for constipation, diarrhea, nausea and vomiting.  Musculoskeletal: Positive for back pain and joint pain.  Neurological: Negative for dizziness and headaches.  All other systems reviewed and are negative.    Physical Exam: Vitals:   03/29/20 0850 03/29/20 0916  BP: 131/70 122/68  Pulse: 80   Temp: 98.2 F (36.8 C)   TempSrc: Oral   SpO2: 99%   Weight: 221 lb 11.2 oz (100.6 kg)   Height: 5\' 7"  (1.702 m)    Gen: Well-developed, well nourished, NAD HEENT: NCAT head, hearing intact CV: RRR, S1, S2 normal Pulm: CTAB, No rales, no wheezes Extm: ROM intact, Peripheral pulses intact, trace ankle edema bilaterally, no calf tenderness Skin: Dry, Warm, normal turgor  Assessment & Plan:   Acute on chronic renal failure (HCC) Wt Readings from Last 3 Encounters:  03/29/20 221 lb 11.2 oz (100.6 kg)  03/06/20 224 lb 13.9 oz (102 kg)  01/18/20 (!) 225 lb 6.4 oz (102.2 kg)    Had AKI noted during ED visit with creatinine increasing from baseline of 1.3 to 2.13. After discharge, had blood work checked with improvement in renal function with return to baseline after restarting lasix. Will check this am to ensure continued stable renal fx. Mildly hypervolemic on  exam but improving weight compared to prior visits. Confirms good urinary output.  - C/w furosemide 40mg  daily - BMP   DVT, lower extremity, proximal, acute, right ([place]) [name] is a 74 yo F w/ PMH of AAA, T2DM, Hypothyroidism, CKD s/p renal transplant presenting to [facility] for f/u after ED visit. She was seen about a month ago in the ED for leg pain and was found to have a new Right LE DVT. She mentions that she was hospitalized briefly at the time and was started on Xarelto at discharge and was told that her DVT was likely caused by COVID. Since discharge her respiratory symptoms from COVID have resolved and her leg pain has improved significantly although she mentions having some muscle cramps. She mentions completing her loading dose of Xarelto 15mg  BID for 21 days and is currently taking it for 20mg  daily. She denies any bleeding events.  A/P F/u for R DVT. Currently on Xarelto 20mg  daily. Denies any bleed. Pain mostly resolved. Having some muscle cramps more likely due to electrolytes abnormalities. Provoked DVT will require conversation regarding risk/benefit of continuing anti-coagulation after 3 months. 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 Not on novolog due to episodes of hypoglycemia. Currently mildly hypoglycemic with cbg of 69 in  clinic today. Resolved with orange juice. Hgb a1c worsening from 6.7 to 7.2. Although considering anemia and renal disease, within margin of error. Will continue current treatment.  - C/w Lantus 35 units  - Encourage to monitor blood sugar at home  Hypertension BP Readings from Last 3 Encounters:  03/29/20 122/68  03/06/20 125/71  01/18/20 (!) 132/76   Blood pressure at goal. Currently not on amlodipine 5mg . Denies any light-headedness or dizziness. Does have some leg swelling but  very mild and chronic. Likely not related to amlodipine initiation.  - C/w amlodipine 5mg  daily    Patient discussed with Dr. Jimmye Norman  -Gilberto Better, [facility] Pager: [phone]

## 2020-03-29 NOTE — Progress Notes (Signed)
Hypoglycemic Event @ 0900 AM  CBG: 69  Treatment: 1/2 cup of OJ given.  Symptoms: Asymptomatic.  Follow-up CBG: CARE:6148 AM CBG Result:94  Possible Reasons for Event: Stated she did not eat this morning.  Comments/MD notified: Yes, Dr Truman Hayward.   [place]

## 2020-03-29 NOTE — Patient Instructions (Addendum)
Dear [name],  Thank you for allowing [place] to provide your care today. Today we discussed your COVID symptoms  I have ordered bmp, magnesium labs for you. I will call if any are abnormal.    Today we made the following changes to your medications:    Please continue taking your Xarelto once daily  Please follow-up in 2 months.    Should you have any questions or concerns please call the internal medicine clinic at [phone].    Thank you for choosing [facility].   Muscle Cramps and Spasms Muscle cramps and spasms are when muscles tighten by themselves. They usually get better within minutes. Muscle cramps are painful. They are usually stronger and last longer than muscle spasms. Muscle spasms may or may not be painful. They can last a few seconds or much longer. Cramps and spasms can affect any muscle, but they occur most often in the calf muscles of the leg. They are usually not caused by a serious problem. In many cases, the cause is not known. Some common causes include:  Doing more physical work or exercise than your body is ready for.  Using the muscles too much (overuse) by repeating certain movements too many times.  Staying in a certain position for a long time.  Playing a sport or doing an activity without preparing properly.  Using bad form or technique while playing a sport or doing an activity.  Not having enough water in your body (dehydration).  Injury.  Side effects of some medicines.  Low levels of the salts and minerals in your blood (electrolytes), such as low potassium or calcium. Follow these instructions at home: Managing pain and stiffness      Massage, stretch, and relax the muscle. Do this for many minutes at a time.  If told, put heat on tight or tense muscles as often as told by your doctor. Use the heat source that your doctor recommends, such as a moist heat pack or a heating pad. ? Place a towel between your skin and the heat  source. ? Leave the heat on for 20-30 minutes. ? Remove the heat if your skin turns bright red. This is very important if you are not able to feel pain, heat, or cold. You may have a greater risk of getting burned.  If told, put ice on the affected area. This may help if you are sore or have pain after a cramp or spasm. ? Put ice in a plastic bag. ? Place a towel between your skin and the bag. ? Leave the ice on for 20 minutes, 2-3 times a day.  Try taking hot showers or baths to help relax tight muscles. Eating and drinking  Drink enough fluid to keep your pee (urine) pale yellow.  Eat a healthy diet to help ensure that your muscles work well. This should include: ? Fruits and vegetables. ? Lean protein. ? Whole grains. ? Low-fat or nonfat dairy products. General instructions  If you are having cramps often, avoid intense exercise for several days.  Take over-the-counter and prescription medicines only as told by your doctor.  Watch for any changes in your symptoms.  Keep all follow-up visits as told by your doctor. This is important. Contact a doctor if:  Your cramps or spasms get worse or happen more often.  Your cramps or spasms do not get better with time. Summary  Muscle cramps and spasms are when muscles tighten by themselves. They usually get better  within minutes.  Cramps and spasms occur most often in the calf muscles of the leg.  Massage, stretch, and relax the muscle. This may help the cramp or spasm go away.  Drink enough fluid to keep your pee (urine) pale yellow. This information is not intended to replace advice given to you by your health care provider. Make sure you discuss any questions you have with your health care provider. Document Revised: 11/04/2017 Document Reviewed: 11/04/2017 Elsevier Patient Education  [place].

## 2020-03-30 ENCOUNTER — Encounter: Payer: Self-pay | Admitting: Internal Medicine

## 2020-03-30 LAB — BMP8+ANION GAP
Anion Gap: 16 mmol/L (ref 10.0–18.0)
BUN/Creatinine Ratio: 17 (ref 12–28)
BUN: 27 mg/dL (ref 8–27)
CO2: 22 mmol/L (ref 20–29)
Calcium: 9.4 mg/dL (ref 8.7–10.3)
Chloride: 102 mmol/L (ref 96–106)
Creatinine, Ser: 1.59 mg/dL — ABNORMAL HIGH (ref 0.57–1.00)
GFR calc Af Amer: 37 mL/min/{1.73_m2} — ABNORMAL LOW (ref >59)
GFR calc non Af Amer: 32 mL/min/{1.73_m2} — ABNORMAL LOW (ref >59)
Glucose: 89 mg/dL (ref 65–99)
Potassium: 4.2 mmol/L (ref 3.5–5.2)
Sodium: 140 mmol/L (ref 134–144)

## 2020-03-30 LAB — MAGNESIUM: Magnesium: 2 mg/dL (ref 1.6–2.3)

## 2020-03-30 NOTE — Assessment & Plan Note (Signed)
[name] is a 74 yo F w/ PMH of AAA, T2DM, Hypothyroidism, CKD s/p renal transplant presenting to [facility] for f/u after ED visit. She was seen about a month ago in the ED for leg pain and was found to have a new Right LE DVT. She mentions that she was hospitalized briefly at the time and was started on Xarelto at discharge and was told that her DVT was likely caused by COVID. Since discharge her respiratory symptoms from COVID have resolved and her leg pain has improved significantly although she mentions having some muscle cramps. She mentions completing her loading dose of Xarelto 15mg  BID for 21 days and is currently taking it for 20mg  daily. She denies any bleeding events.  A/P F/u for R DVT. Currently on Xarelto 20mg  daily. Denies any bleed. Pain mostly resolved. Having some muscle cramps more likely due to electrolytes abnormalities. Provoked DVT will require conversation regarding risk/benefit of continuing anti-coagulation after 3 months. - C/w Xarelto 20mg  daily - F/u w/ PCP in December

## 2020-03-30 NOTE — Assessment & Plan Note (Addendum)
Wt Readings from Last 3 Encounters:  03/29/20 221 lb 11.2 oz (100.6 kg)  03/06/20 224 lb 13.9 oz (102 kg)  01/18/20 (!) 225 lb 6.4 oz (102.2 kg)    Had AKI noted during ED visit with creatinine increasing from baseline of 1.3 to 2.13. After discharge, had blood work checked with improvement in renal function with return to baseline after restarting lasix. Will check this am to ensure continued stable renal fx. Mildly hypervolemic on exam but improving weight compared to prior visits. Confirms good urinary output.  - C/w furosemide 40mg  daily - BMP

## 2020-03-30 NOTE — Assessment & Plan Note (Signed)
BP Readings from Last 3 Encounters:  03/29/20 122/68  03/06/20 125/71  01/18/20 (!) 132/76   Blood pressure at goal. Currently not on amlodipine 5mg . Denies any light-headedness or dizziness. Does have some leg swelling but very mild and chronic. Likely not related to amlodipine initiation.  - C/w amlodipine 5mg  daily

## 2020-03-30 NOTE — Assessment & Plan Note (Signed)
Lab Results  Component Value Date   HGBA1C 7.2 (A) 03/29/2020   Currently on Lantus 35 units nightly. Not on novolog due to episodes of hypoglycemia. Currently mildly hypoglycemic with cbg of 69 in clinic today. Resolved with orange juice. Hgb a1c worsening from 6.7 to 7.2. Although considering anemia and renal disease, within margin of error. Will continue current treatment.  - C/w Lantus 35 units  - Encourage to monitor blood sugar at home

## 2020-03-31 NOTE — Progress Notes (Signed)
DOS 03/29/20 Internal Medicine Clinic Attending  Case discussed with Dr. Lee  At the time of the visit.  We reviewed the resident's history and exam and pertinent patient test results.  I agree with the assessment, diagnosis, and plan of care documented in the resident's note. 

## 2020-04-21 ENCOUNTER — Other Ambulatory Visit: Payer: Self-pay | Admitting: Student in an Organized Health Care Education/Training Program

## 2020-05-04 ENCOUNTER — Other Ambulatory Visit: Payer: Self-pay | Admitting: Student in an Organized Health Care Education/Training Program

## 2020-05-04 DIAGNOSIS — K219 Gastro-esophageal reflux disease without esophagitis: Secondary | ICD-10-CM

## 2020-05-05 ENCOUNTER — Other Ambulatory Visit: Payer: Self-pay | Admitting: Student in an Organized Health Care Education/Training Program

## 2020-05-05 DIAGNOSIS — K219 Gastro-esophageal reflux disease without esophagitis: Secondary | ICD-10-CM

## 2020-05-05 DIAGNOSIS — Z794 Long term (current) use of insulin: Secondary | ICD-10-CM | POA: Diagnosis not present

## 2020-05-05 DIAGNOSIS — H52223 Regular astigmatism, bilateral: Secondary | ICD-10-CM | POA: Diagnosis not present

## 2020-05-05 DIAGNOSIS — H5203 Hypermetropia, bilateral: Secondary | ICD-10-CM | POA: Diagnosis not present

## 2020-05-05 DIAGNOSIS — Z961 Presence of intraocular lens: Secondary | ICD-10-CM | POA: Diagnosis not present

## 2020-05-05 DIAGNOSIS — H3552 Pigmentary retinal dystrophy: Secondary | ICD-10-CM | POA: Diagnosis not present

## 2020-05-05 DIAGNOSIS — E10311 Type 1 diabetes mellitus with unspecified diabetic retinopathy with macular edema: Secondary | ICD-10-CM | POA: Diagnosis not present

## 2020-05-05 LAB — HM DIABETES EYE EXAM

## 2020-05-05 MED ORDER — PANTOPRAZOLE SODIUM 40 MG PO TBEC: 40.0000 mg | DELAYED_RELEASE_TABLET | Freq: Every day | ORAL | 3 refills | Status: DC

## 2020-05-05 NOTE — Telephone Encounter (Signed)
REFILL REQUEST   pantoprazole (PROTONIX) 40 MG tablet  [facility] [phone] - [place], [place]

## 2020-05-23 ENCOUNTER — Encounter: Payer: Medicare Other | Admitting: Student in an Organized Health Care Education/Training Program

## 2020-05-23 ENCOUNTER — Telehealth: Payer: Self-pay

## 2020-05-23 ENCOUNTER — Other Ambulatory Visit: Payer: Self-pay | Admitting: *Deleted

## 2020-05-23 MED ORDER — LEVOTHYROXINE SODIUM 100 MCG PO TABS
100.0000 ug | ORAL_TABLET | Freq: Every day | ORAL | 3 refills | Status: DC
Start: 2020-05-23 — End: 2020-09-13

## 2020-05-30 ENCOUNTER — Encounter: Payer: Medicare Other | Admitting: Student in an Organized Health Care Education/Training Program

## 2020-05-30 ENCOUNTER — Encounter: Payer: Self-pay | Admitting: *Deleted

## 2020-06-01 DIAGNOSIS — Z9889 Other specified postprocedural states: Secondary | ICD-10-CM | POA: Diagnosis not present

## 2020-06-01 DIAGNOSIS — N1832 Chronic kidney disease, stage 3b: Secondary | ICD-10-CM | POA: Diagnosis not present

## 2020-06-01 DIAGNOSIS — I129 Hypertensive chronic kidney disease with stage 1 through stage 4 chronic kidney disease, or unspecified chronic kidney disease: Secondary | ICD-10-CM | POA: Diagnosis not present

## 2020-06-01 DIAGNOSIS — Z940 Kidney transplant status: Secondary | ICD-10-CM | POA: Diagnosis not present

## 2020-06-01 DIAGNOSIS — Z79899 Other long term (current) drug therapy: Secondary | ICD-10-CM | POA: Diagnosis not present

## 2020-06-01 DIAGNOSIS — D631 Anemia in chronic kidney disease: Secondary | ICD-10-CM | POA: Diagnosis not present

## 2020-06-01 DIAGNOSIS — I714 Abdominal aortic aneurysm, without rupture: Secondary | ICD-10-CM | POA: Diagnosis not present

## 2020-06-01 DIAGNOSIS — Z95828 Presence of other vascular implants and grafts: Secondary | ICD-10-CM | POA: Diagnosis not present

## 2020-06-03 ENCOUNTER — Encounter: Payer: Self-pay | Admitting: *Deleted

## 2020-06-03 NOTE — Progress Notes (Signed)

## 2020-06-06 ENCOUNTER — Encounter: Payer: Medicare Other | Admitting: Student in an Organized Health Care Education/Training Program

## 2020-06-06 NOTE — Progress Notes (Signed)
Things That May Be Affecting Your Health:  Alcohol  Hearing loss  Pain    Depression  Home Safety  Sexual Health  x Diabetes x Lack of physical activity  Stress   Difficulty with daily activities  Loneliness  Tiredness   Drug use  Medicines  Tobacco use   Falls  Motor Vehicle Safety  Weight   Food choices  Oral Health  Other    YOUR PERSONALIZED HEALTH PLAN : 1. Schedule your next subsequent Medicare Wellness visit in one year 2. Attend all of your regular appointments to address your medical issues 3. Complete the preventative screenings and services   Annual Wellness Visit   Medicare Covered Preventative Screenings and [place] Men and Women Who How Often Need? Date of Last Service Action  Abdominal Aortic Aneurysm Adults with AAA risk factors Once     Alcohol Misuse and Counseling All Adults Screening once a year if no alcohol misuse. Counseling up to 4 face to face sessions.     Bone Density Measurement  Adults at risk for osteoporosis Once every 2 yrs     Lipid Panel Z13.6 All adults without CV disease Once every 5 yrs     Colorectal Cancer   Stool sample or  Colonoscopy All adults 65 and older   Once every year  Every 10 years  Y    Depression All Adults Once a year  Today   Diabetes Screening Blood glucose, post glucose load, or GTT Z13.1  All adults at risk  Pre-diabetics  Once per year  Twice per year     Diabetes  Self-Management Training All adults Diabetics 10 hrs first year; 2 hours subsequent years. Requires Copay     Glaucoma  Diabetics  Family history of glaucoma  African Americans 38 yrs +  Hispanic Americans 49 yrs + Annually - requires coppay     Hepatitis C Z72.89 or F19.20  High Risk for HCV  Born between 1945 and 1965  Annually  Once     HIV Z11.4 All adults based on risk  Annually btw ages 91 & 46 regardless of risk  Annually > 65 yrs if at increased risk     Lung Cancer Screening Asymptomatic adults  aged 110-77 with 30 pack yr history and current smoker OR quit within the last 15 yrs Annually Must have counseling and shared decision making documentation before first screen     Medical Nutrition Therapy Adults with   Diabetes  Renal disease  Kidney transplant within past 3 yrs 3 hours first year; 2 hours subsequent years     Obesity and Counseling All adults Screening once a year Counseling if BMI 30 or higher  Today   Tobacco Use Counseling Adults who use tobacco  Up to 8 visits in one year     Vaccines Z23  Hepatitis B  Influenza   Pneumonia  Adults   Once  Once every flu season  Two different vaccines separated by one year     Next Annual Wellness Visit People with Medicare Every year  Today     Services & Screenings Women Who How Often Need  Date of Last Service Action  Mammogram  Z12.31 Women over 61 One baseline ages 60-39. Annually ager 1 yrs+ Y    Pap tests All women Annually if high risk. Every 2 yrs for normal risk women     Screening for cervical cancer with   Pap (Z01.419 nl or Z01.411abnl) &  HPV Z11.51 Women aged 51 to 43 Once every 5 yrs     Screening pelvic and breast exams All women Annually if high risk. Every 2 yrs for normal risk women     Sexually Transmitted Diseases  Chlamydia  Gonorrhea  Syphilis All at risk adults Annually for non pregnant females at increased risk         [place] Men Who How Ofter Need  Date of Last Service Action  Prostate Cancer - DRE & PSA Men over 50 Annually.  DRE might require a copay.     Sexually Transmitted Diseases  Syphilis All at risk adults Annually for men at increased risk

## 2020-06-13 ENCOUNTER — Other Ambulatory Visit: Payer: Self-pay | Admitting: Student in an Organized Health Care Education/Training Program

## 2020-06-13 ENCOUNTER — Other Ambulatory Visit: Payer: Self-pay | Admitting: *Deleted

## 2020-06-13 DIAGNOSIS — Z794 Long term (current) use of insulin: Secondary | ICD-10-CM

## 2020-06-13 MED ORDER — LANTUS SOLOSTAR 100 UNIT/ML ~~LOC~~ SOPN: PEN_INJECTOR | SUBCUTANEOUS | 1 refills | Status: DC

## 2020-06-19 DIAGNOSIS — E119 Type 2 diabetes mellitus without complications: Secondary | ICD-10-CM | POA: Diagnosis not present

## 2020-06-19 DIAGNOSIS — Z7952 Long term (current) use of systemic steroids: Secondary | ICD-10-CM | POA: Diagnosis not present

## 2020-06-19 DIAGNOSIS — M109 Gout, unspecified: Secondary | ICD-10-CM | POA: Diagnosis not present

## 2020-06-19 DIAGNOSIS — Z794 Long term (current) use of insulin: Secondary | ICD-10-CM | POA: Diagnosis not present

## 2020-06-19 DIAGNOSIS — Z7901 Long term (current) use of anticoagulants: Secondary | ICD-10-CM | POA: Diagnosis not present

## 2020-06-19 DIAGNOSIS — I10 Essential (primary) hypertension: Secondary | ICD-10-CM | POA: Diagnosis not present

## 2020-06-19 DIAGNOSIS — M10072 Idiopathic gout, left ankle and foot: Secondary | ICD-10-CM | POA: Diagnosis not present

## 2020-06-19 DIAGNOSIS — Z86718 Personal history of other venous thrombosis and embolism: Secondary | ICD-10-CM | POA: Diagnosis not present

## 2020-06-19 DIAGNOSIS — L03116 Cellulitis of left lower limb: Secondary | ICD-10-CM | POA: Diagnosis not present

## 2020-06-19 DIAGNOSIS — R2242 Localized swelling, mass and lump, left lower limb: Secondary | ICD-10-CM | POA: Diagnosis not present

## 2020-06-19 DIAGNOSIS — M79672 Pain in left foot: Secondary | ICD-10-CM | POA: Diagnosis not present

## 2020-06-21 NOTE — Telephone Encounter (Signed)
This was refilled on 06/13/20 by Dr. Evette Doffing.  Please let me know if needs to be resent?

## 2020-07-12 ENCOUNTER — Encounter: Payer: Medicare Other | Admitting: Student

## 2020-07-21 ENCOUNTER — Encounter: Payer: Medicare Other | Admitting: Student

## 2020-08-01 ENCOUNTER — Other Ambulatory Visit: Payer: Self-pay | Admitting: Student in an Organized Health Care Education/Training Program

## 2020-08-10 ENCOUNTER — Other Ambulatory Visit: Payer: Self-pay | Admitting: Internal Medicine

## 2020-09-02 ENCOUNTER — Other Ambulatory Visit: Payer: Self-pay | Admitting: Student in an Organized Health Care Education/Training Program

## 2020-09-02 DIAGNOSIS — Z794 Long term (current) use of insulin: Secondary | ICD-10-CM

## 2020-09-02 DIAGNOSIS — E1143 Type 2 diabetes mellitus with diabetic autonomic (poly)neuropathy: Secondary | ICD-10-CM

## 2020-09-13 ENCOUNTER — Other Ambulatory Visit: Payer: Self-pay

## 2020-09-13 ENCOUNTER — Other Ambulatory Visit: Payer: Self-pay | Admitting: Student in an Organized Health Care Education/Training Program

## 2020-09-13 MED ORDER — LEVOTHYROXINE SODIUM 100 MCG PO TABS: 100.0000 ug | ORAL_TABLET | Freq: Every day | ORAL | 3 refills | Status: DC

## 2020-09-13 NOTE — Telephone Encounter (Signed)
Return call to [place] which stated the Levothyroxine was discontinued ; she could not tell why or when. But stated the pt only received 30 tabs instead of 90 as prescribed. Please send another rx  Thanks

## 2020-09-13 NOTE — Telephone Encounter (Signed)
[name] with [facility] requesting to speak with a nurse  levothyroxine (SYNTHROID) 100 MCG tablet. Please call back.

## 2020-10-24 ENCOUNTER — Encounter: Payer: Medicare Other | Admitting: Student

## 2020-11-04 ENCOUNTER — Encounter: Payer: Medicare Other | Admitting: Internal Medicine

## 2020-11-24 ENCOUNTER — Other Ambulatory Visit: Payer: Self-pay

## 2020-11-24 ENCOUNTER — Encounter: Payer: Self-pay | Admitting: Internal Medicine

## 2020-11-24 ENCOUNTER — Ambulatory Visit (INDEPENDENT_AMBULATORY_CARE_PROVIDER_SITE_OTHER): Payer: Medicare Other | Admitting: Internal Medicine

## 2020-11-24 VITALS — BP 143/73 | HR 76 | Temp 98.4°F | Ht 67.0 in | Wt 235.1 lb

## 2020-11-24 DIAGNOSIS — R600 Localized edema: Secondary | ICD-10-CM | POA: Diagnosis not present

## 2020-11-24 DIAGNOSIS — S8991XA Unspecified injury of right lower leg, initial encounter: Secondary | ICD-10-CM | POA: Diagnosis not present

## 2020-11-24 NOTE — Patient Instructions (Signed)
Ms. Hukill,   It was a pleasure meeting you today. Today we discussed the following:   1. Bilateral leg pain and swelling-I have put in an order for a vascular ultrasound, someone should call you within a week to schedule this.  In the meantime I want you to continue using your compression stockings and elevating her legs at night.    Please call the internal medicine center clinic if you have any questions or concerns, we may be able to help and keep you from a long and expensive emergency room wait. Our clinic and after hours phone number is [phone], the best time to call is Monday through Friday 9 am to 4 pm but there is always someone available 24/7 if you have an emergency. If you need medication refills please notify your pharmacy one week in advance and they will send [place] a request.   Thank you for allowing [place] to be a part of your care!

## 2020-11-25 ENCOUNTER — Telehealth: Payer: Self-pay

## 2020-11-25 DIAGNOSIS — S8990XA Unspecified injury of unspecified lower leg, initial encounter: Secondary | ICD-10-CM | POA: Insufficient documentation

## 2020-11-25 NOTE — Telephone Encounter (Signed)
Requesting medication cream for leg and tylenol #3. Please call pt back.

## 2020-11-25 NOTE — Progress Notes (Signed)
   CC: leg injury  HPI:  [name] is a 75 y.o. with a past medical history listed below presenting for evaluation of leg injuries. For details of today's visit and the status of his chronic medical issues please refer to the assessment and plan.   Past Medical History:  Diagnosis Date  . AAA (abdominal aortic aneurysm) (HCC)    3.8 cm Infrarenal managed at [facility]  . Clotting disorder ([place])    PE, IVC filter placed at [facility]  . Diabetes mellitus   . Hypertension   . Hypothyroidism   . Morbid obesity (HCC)    BMI 34  . Renal transplant recipient    [facility]   Review of Systems:   ROS   Physical Exam:  Vitals:   11/24/20 1446 11/24/20 1452  BP: (!) 149/75 (!) 143/73  Pulse: 87 76  Temp: 98.4 F (36.9 C)   TempSrc: Oral   SpO2: 100%   Weight: 235 lb 1.6 oz (106.6 kg)   Height: 5\' 7"  (1.702 m)    Physical Exam Vitals reviewed.  Constitutional:      General: She is not in acute distress.    Appearance: Normal appearance. She is not ill-appearing.  Cardiovascular:     Rate and Rhythm: Normal rate and regular rhythm.     Pulses: Normal pulses.     Heart sounds: Normal heart sounds. No murmur heard. No friction rub. No gallop.   Pulmonary:     Effort: Pulmonary effort is normal. No respiratory distress.     Breath sounds: Normal breath sounds. No wheezing or rales.  Abdominal:     General: Abdomen is flat. Bowel sounds are normal. There is no distension.     Palpations: Abdomen is soft.     Tenderness: There is no abdominal tenderness.  Musculoskeletal:        General: Swelling, tenderness and signs of injury present.     Right lower leg: Edema present.     Left lower leg: Edema present.  Skin:    General: Skin is warm and dry.     Findings: Bruising and lesion present.  Neurological:     Mental Status: She is alert and oriented to person, place, and time.     Motor: No weakness.  Psychiatric:        Mood and Affect: Mood normal.         Behavior: Behavior normal.        Thought Content: Thought content normal.        Judgment: Judgment normal.     Assessment & Plan:   See Encounters Tab for problem based charting.  Patient seen with Dr. Evette Doffing

## 2020-11-25 NOTE — Telephone Encounter (Signed)
RTC, VM obtained and message left that nurse was returning her call. SChaplin, RN,BSN  

## 2020-11-25 NOTE — Assessment & Plan Note (Signed)
Patient presents for evaluation after sustaining a bilateral lower leg injury.  She states that on March 27 she was taking the train and as she was coming down the stairs she took a hard hit to both her lower legs.  She believes that she hit something on the train.  She denies any falls.  States that she is having difficulty walking however is now able to bear weight and ambulate some.  States that her leg was significantly swollen and tender.  She developed blistering on bilateral ankles which oozing pus.  Endorses some fevers and nausea.  Denies any abdominal pain or vomiting.  She states that some of the discoloration on bilateral legs is chronic however some is new, however improving since the injury.  She has been using Tylenol as needed which has improved her pain.  She was hospitalized last September for [place] and at that time found to have left lower extremity DVTs and treated with Xarelto for 30 days.  On exam she is significantly tender on bilateral anterior lower legs and there is swelling  Given her history and risk factors for DVTs will repeat vascular ultrasound at this time.  Encouraged her to use compression stockings and keep her legs elevated at nighttime.  Plan: Follow-up bilateral lower extremity vascular ultrasound Encourage using compression stockings and elevating legs at nighttime

## 2020-11-25 NOTE — Telephone Encounter (Signed)
I tried a second time to call, I left a message. If she calls back, can you please give her my recommendations.

## 2020-11-25 NOTE — Telephone Encounter (Signed)
Patient called back. Relayed info below. Will forward to [place] for assistance with scheduling U/S. Patient requests morning phone call on Monday from [place].

## 2020-11-25 NOTE — Telephone Encounter (Signed)
I called her but no answer. Sounds like some confusion. I recommend against neosporin (not needed) and against codeine (high risk for falls). Next step is to get the ultrasound that was ordered today.

## 2020-11-25 NOTE — Telephone Encounter (Signed)
Patient called back. States she has been using neosporin cream to bruise on leg but keeps running out of little tubes. Would like a Rx sent in for this. Also states Dr. Talked about prescribing Tylenol #3. She has been taking extra strength tylenol but now would like T#3. Discussed with PCP and he will call patient.

## 2020-11-28 NOTE — Progress Notes (Signed)
Internal Medicine Clinic Attending  I saw and evaluated the patient.  I personally confirmed the key portions of the history and exam documented by Dr.  Rehman  and I reviewed pertinent patient test results.  The assessment, diagnosis, and plan were formulated together and I agree with the documentation in the resident's note.  

## 2020-11-29 ENCOUNTER — Ambulatory Visit (HOSPITAL_COMMUNITY): Admission: RE | Admit: 2020-11-29 | Payer: Medicare Other | Source: Ambulatory Visit

## 2020-12-01 ENCOUNTER — Other Ambulatory Visit: Payer: Self-pay

## 2020-12-01 ENCOUNTER — Ambulatory Visit (HOSPITAL_COMMUNITY)
Admission: RE | Admit: 2020-12-01 | Discharge: 2020-12-01 | Disposition: A | Discharge status: Home / Self Care | Payer: Medicare Other | Source: Ambulatory Visit | Attending: Student in an Organized Health Care Education/Training Program | Admitting: Student in an Organized Health Care Education/Training Program

## 2020-12-01 ENCOUNTER — Telehealth: Payer: Self-pay

## 2020-12-01 DIAGNOSIS — R600 Localized edema: Secondary | ICD-10-CM

## 2020-12-01 NOTE — Progress Notes (Signed)
VASCULAR LAB    Bilateral lower extremity venous duplex has been performed.  See CV proc for preliminary results.   Called results to Dr. Marcos Eke, [facility], RVT 12/01/2020, 1:26 PM

## 2020-12-01 NOTE — Telephone Encounter (Signed)
Requesting ultrasound test result, please call back.

## 2020-12-05 NOTE — Telephone Encounter (Signed)
I spoke with the patient. Talked about the ultrasound result, her leg swelling is likely venous insufficiency. No need for continued anticoagulation. We talked about supportive care including compression stockings, exercise, and elevation. She has occasional cramps, and we talked about regular exercise to help with that.

## 2021-05-09 ENCOUNTER — Telehealth: Payer: Self-pay | Admitting: *Deleted

## 2021-05-09 NOTE — Telephone Encounter (Signed)
Patient called in with 2 day hx of sneezing, runny eyes, nasal congestion. No relief with benadryl or Tylenol Cold. Patient received 2 Covid vaccines, no booster, and no flu vaccine this season.  Also, c/o right hip pain as she "stepped into cab wrong" over the weekend. Appt given for tomorrow at 1:30 with [place] [facility].

## 2021-05-10 ENCOUNTER — Encounter: Payer: Medicare Other | Admitting: Internal Medicine

## 2021-05-11 ENCOUNTER — Ambulatory Visit (INDEPENDENT_AMBULATORY_CARE_PROVIDER_SITE_OTHER): Payer: Medicare Other | Admitting: Internal Medicine

## 2021-05-11 ENCOUNTER — Other Ambulatory Visit: Payer: Self-pay

## 2021-05-11 ENCOUNTER — Encounter: Payer: Self-pay | Admitting: Internal Medicine

## 2021-05-11 VITALS — BP 152/83 | HR 88 | Temp 98.3°F | Ht 67.0 in | Wt 223.7 lb

## 2021-05-11 DIAGNOSIS — E1143 Type 2 diabetes mellitus with diabetic autonomic (poly)neuropathy: Secondary | ICD-10-CM

## 2021-05-11 DIAGNOSIS — Z794 Long term (current) use of insulin: Secondary | ICD-10-CM

## 2021-05-11 DIAGNOSIS — K219 Gastro-esophageal reflux disease without esophagitis: Secondary | ICD-10-CM

## 2021-05-11 DIAGNOSIS — Z940 Kidney transplant status: Secondary | ICD-10-CM

## 2021-05-11 DIAGNOSIS — R0981 Nasal congestion: Secondary | ICD-10-CM | POA: Insufficient documentation

## 2021-05-11 DIAGNOSIS — J3489 Other specified disorders of nose and nasal sinuses: Secondary | ICD-10-CM

## 2021-05-11 DIAGNOSIS — I10 Essential (primary) hypertension: Secondary | ICD-10-CM

## 2021-05-11 MED ORDER — AMLODIPINE BESYLATE 10 MG PO TABS: 10.0000 mg | ORAL_TABLET | Freq: Every day | ORAL | 3 refills | Status: AC

## 2021-05-11 MED ORDER — PANTOPRAZOLE SODIUM 40 MG PO TBEC: 40.0000 mg | DELAYED_RELEASE_TABLET | Freq: Every day | ORAL | 3 refills | Status: AC

## 2021-05-11 MED ORDER — SULFAMETHOXAZOLE-TRIMETHOPRIM 400-80 MG PO TABS: 1.0000 | ORAL_TABLET | ORAL | 0 refills | Status: DC

## 2021-05-11 MED ORDER — LANTUS SOLOSTAR 100 UNIT/ML ~~LOC~~ SOPN
PEN_INJECTOR | SUBCUTANEOUS | 5 refills | Status: DC
Start: 2021-05-11 — End: 2021-05-11

## 2021-05-11 MED ORDER — FLUTICASONE PROPIONATE 50 MCG/ACT NA SUSP: 1.0000 | Freq: Every day | NASAL | 0 refills | Status: DC

## 2021-05-11 MED ORDER — LANTUS SOLOSTAR 100 UNIT/ML ~~LOC~~ SOPN: PEN_INJECTOR | SUBCUTANEOUS | 5 refills | Status: DC

## 2021-05-11 MED ORDER — LEVOTHYROXINE SODIUM 100 MCG PO TABS: 100.0000 ug | ORAL_TABLET | Freq: Every day | ORAL | 3 refills | Status: AC

## 2021-05-11 MED ORDER — TACROLIMUS 1 MG PO CAPS: 5.0000 mg | ORAL_CAPSULE | Freq: Two times a day (BID) | ORAL | 11 refills | Status: AC

## 2021-05-11 MED ORDER — GUAIFENESIN-DM 100-10 MG/5ML PO SYRP: 5.0000 mL | ORAL_SOLUTION | ORAL | 0 refills | Status: DC | PRN

## 2021-05-11 MED ORDER — PREDNISONE 5 MG PO TABS: 5.0000 mg | ORAL_TABLET | Freq: Every day | ORAL | 0 refills | Status: AC

## 2021-05-11 MED ORDER — FUROSEMIDE 40 MG PO TABS: ORAL_TABLET | ORAL | 3 refills | Status: AC

## 2021-05-11 NOTE — Assessment & Plan Note (Addendum)
The patient is here today for a 4-day history of congestion, sneezing, watery eyes.  She states that she was visiting her family in [place] over the weekend.  She noticed her granddaughter was blowing her nose, but nobody else appear to be sick.  Then, on that Sunday, the patient suddenly felt sick.  She states that she feels better overall since Sunday, however she continues to cough (yellowish in color), and she states that her nose feels sore due to blowing it so often.  She has tried Benadryl and Tylenol for treatment, which have helped her symptoms.  She does not have a history of any allergies and she has not started any new medications recently.  She does also have some muscle aches that are improving, and she states that she initially had a sore throat when this came on but no longer has it.  P: We discussed that this could be a viral infection which will get better over time.  Advised to stay hydrated throughout the day.  Today, will obtain respiratory panel for flu, COVID, RSV.  We will contact patient with results (1 to 2 days).  We have also prescribed Flonase and Mucinex for the patient.  Especially given her history of immunocompromise, patient instructed to follow-up if condition is worsening.

## 2021-05-11 NOTE — Assessment & Plan Note (Signed)
Patient is on amlodipine, lasix, trental regimens. BP 165/76, 152/83 on repeat. Pt was noted to be normotensive on this regimen otherwise per chart review.  -Continue current BP regimen

## 2021-05-11 NOTE — Progress Notes (Signed)
   CC: nasal congestion/sneezing, right hip pain  HPI:  [name] is a 75 y.o. with past medical history as noted below who presents to the clinic today for nasal congestion/sneezing and right hip pain. Please see problem-based list for further details, assessments, and plans.  Past Medical History:  Diagnosis Date   AAA (abdominal aortic aneurysm)    3.8 cm Infrarenal managed at [place] [facility])    PE, IVC filter placed at [facility]   Diabetes mellitus    Hypertension    Hypothyroidism    Morbid obesity ([place])    BMI 34   Renal transplant recipient    [facility]   Review of Systems:   Review of Systems  Constitutional:  Positive for malaise/fatigue.  HENT:  Positive for congestion.   Eyes:  Positive for discharge.  Respiratory:  Positive for cough and sputum production.   Cardiovascular: Negative.   Gastrointestinal: Negative.   Genitourinary: Negative.   Musculoskeletal:  Positive for myalgias.  Skin: Negative.   Neurological: Negative.   Endo/Heme/Allergies: Negative.   Psychiatric/Behavioral: Negative.     Physical Exam:  Vitals:   05/11/21 0951 05/11/21 1022  BP: (!) 165/76 (!) 152/83  Pulse: 97 88  Temp: 98.3 F (36.8 C)   TempSrc: Oral   SpO2: 100%   Weight: 223 lb 11.2 oz (101.5 kg)   Height: 5\' 7"  (1.702 m)    Physical Exam Constitutional:      General: She is not in acute distress. HENT:     Head: Normocephalic and atraumatic.     Ears:     Comments: Limited due to earwax in both ears    Nose: Congestion and rhinorrhea present.     Mouth/Throat:     Pharynx: No oropharyngeal exudate or posterior oropharyngeal erythema.  Eyes:     General:        Right eye: No discharge.        Left eye: Discharge present.    Comments: Watery discharge from left eye  Cardiovascular:     Rate and Rhythm: Normal rate and regular rhythm.     Heart sounds: No murmur heard.   No friction rub. No gallop.  Pulmonary:     Effort:  Pulmonary effort is normal. No respiratory distress.     Breath sounds: Normal breath sounds. No wheezing, rhonchi or rales.  Abdominal:     General: Abdomen is flat. There is no distension.  Musculoskeletal:        General: Normal range of motion.  Skin:    General: Skin is warm and dry.  Neurological:     General: No focal deficit present.     Mental Status: She is alert and oriented to person, place, and time. Mental status is at baseline.  Psychiatric:        Mood and Affect: Mood normal.        Behavior: Behavior normal.    Assessment & Plan:   See Encounters Tab for problem based charting.  Patient seen with Dr.  Saverio Danker

## 2021-05-12 ENCOUNTER — Telehealth: Payer: Self-pay | Admitting: Student in an Organized Health Care Education/Training Program

## 2021-05-12 LAB — COVID-19, FLU A+B AND RSV
Influenza A, NAA: NOT DETECTED
Influenza B, NAA: NOT DETECTED
RSV, NAA: NOT DETECTED
SARS-CoV-2, NAA: NOT DETECTED

## 2021-05-12 NOTE — Telephone Encounter (Signed)
Covid/flu results are not back at this time. Provider sent Rx for Flonase and Robitussin to [facility]. Attempted to relay this to patient. No answer. Left message on VM requesting return call.

## 2021-05-12 NOTE — Telephone Encounter (Signed)
      2:37 PM Note   Patient requesting her Covid and Flu results.  Pt also asking has anything been called in for her to pick up.

## 2021-05-12 NOTE — Telephone Encounter (Signed)
Patient requesting her Covid and Flu results.  Pt also asking has anything been called in for her to pick up.

## 2021-05-15 ENCOUNTER — Telehealth: Payer: Self-pay

## 2021-05-15 NOTE — Telephone Encounter (Signed)
Pt rtn call about her test results. 

## 2021-05-15 NOTE — Telephone Encounter (Signed)
Received TC from patient requesting Covid results.  RN informed patient that Covid, Flu, and RSV test were all negative.  Patient states she is still coughing, sneezing, and has nasal congestion.  She states she has not picked up RX's for Flonase and mucinex.   Per Dr. Evette Georges office notes on 11/17, it is noted that MD discussed w/ patient this could be a viral infection which will get better over time.  RN reiterated this to patient and she was encouraged to pick up RX's which were prescribed.  She verbalized understanding and agreement.   SChaplin, RN,BSN

## 2021-05-15 NOTE — Telephone Encounter (Signed)
RTC, VM obtained and message left that nurse was returning her call. SChaplin, RN,BSN  

## 2021-05-15 NOTE — Telephone Encounter (Signed)
Requesting to speak with a nurse about test results. Please call pt back.  

## 2021-05-16 NOTE — Progress Notes (Signed)
Internal Medicine Clinic Attending ? ?I saw and evaluated the patient.  I personally confirmed the key portions of the history and exam documented by Dr. Bonanno and I reviewed pertinent patient test results.  The assessment, diagnosis, and plan were formulated together and I agree with the documentation in the resident?s note. ? ?

## 2021-07-05 ENCOUNTER — Other Ambulatory Visit: Payer: Self-pay | Admitting: *Deleted

## 2021-07-05 MED ORDER — BD PEN NEEDLE NANO U/F 32G X 4 MM MISC: 1 refills | Status: AC

## 2021-07-05 NOTE — Telephone Encounter (Signed)
Next appt scheduled 07/17/21 with PCP.

## 2021-07-07 ENCOUNTER — Telehealth: Payer: Self-pay

## 2021-07-07 NOTE — Telephone Encounter (Signed)
Insulin Pen Needle (BD PEN NEEDLE NANO U/F) 32G X 4 MM MISC, refill request @ [facility] [phone] - [place], [place].

## 2021-07-07 NOTE — Telephone Encounter (Signed)
Pt called - no answer; left message to call [facility] about the pen needles.

## 2021-07-12 ENCOUNTER — Other Ambulatory Visit: Payer: Self-pay | Admitting: Internal Medicine

## 2021-07-17 ENCOUNTER — Encounter: Payer: Medicare Other | Admitting: Student in an Organized Health Care Education/Training Program

## 2021-09-12 ENCOUNTER — Telehealth: Payer: Self-pay | Admitting: *Deleted

## 2021-09-12 NOTE — Telephone Encounter (Signed)
Spoke to patient about need to come in for problems with legs.  Patient stated that this was occurring when she was last seen by her PCP.  Stated no drainage and that her leg was now scabbing.  Has some discoloration on her leg. Denies any hot to touch or pain in leg.  Refused to come in today or tomorrow states plans too keep appointment that she was given for Thursday.  Patient voiced when asked that she plans to keep that appointment.   ?

## 2021-09-13 ENCOUNTER — Other Ambulatory Visit: Payer: Self-pay | Admitting: Student in an Organized Health Care Education/Training Program

## 2021-09-13 NOTE — Telephone Encounter (Signed)
Next appt scheduled tomorrow with Dr Collene Gobble. ?

## 2021-09-14 ENCOUNTER — Encounter: Payer: Medicare Other | Admitting: Student

## 2021-09-18 LAB — HEMOGLOBIN A1C: Hemoglobin A1C: 10.3

## 2021-09-21 ENCOUNTER — Ambulatory Visit (INDEPENDENT_AMBULATORY_CARE_PROVIDER_SITE_OTHER): Payer: Medicare Other | Admitting: Student

## 2021-09-21 ENCOUNTER — Encounter: Payer: Self-pay | Admitting: *Deleted

## 2021-09-21 ENCOUNTER — Encounter: Payer: Medicare Other | Admitting: Internal Medicine

## 2021-09-21 VITALS — BP 137/73 | HR 86 | Wt 220.3 lb

## 2021-09-21 DIAGNOSIS — E1143 Type 2 diabetes mellitus with diabetic autonomic (poly)neuropathy: Secondary | ICD-10-CM | POA: Diagnosis present

## 2021-09-21 DIAGNOSIS — E785 Hyperlipidemia, unspecified: Secondary | ICD-10-CM | POA: Diagnosis not present

## 2021-09-21 DIAGNOSIS — K3184 Gastroparesis: Secondary | ICD-10-CM

## 2021-09-21 DIAGNOSIS — E1169 Type 2 diabetes mellitus with other specified complication: Secondary | ICD-10-CM

## 2021-09-21 DIAGNOSIS — R0989 Other specified symptoms and signs involving the circulatory and respiratory systems: Secondary | ICD-10-CM

## 2021-09-21 DIAGNOSIS — Z0000 Encounter for general adult medical examination without abnormal findings: Secondary | ICD-10-CM

## 2021-09-21 DIAGNOSIS — Z23 Encounter for immunization: Secondary | ICD-10-CM

## 2021-09-21 DIAGNOSIS — Z794 Long term (current) use of insulin: Secondary | ICD-10-CM | POA: Diagnosis not present

## 2021-09-21 DIAGNOSIS — I10 Essential (primary) hypertension: Secondary | ICD-10-CM | POA: Diagnosis not present

## 2021-09-21 MED ORDER — EZETIMIBE 10 MG PO TABS: 10.0000 mg | ORAL_TABLET | Freq: Every day | ORAL | 0 refills | Status: AC

## 2021-09-21 MED ORDER — LANTUS SOLOSTAR 100 UNIT/ML ~~LOC~~ SOPN: 38.0000 [IU] | PEN_INJECTOR | Freq: Every day | SUBCUTANEOUS | 5 refills | Status: DC

## 2021-09-21 NOTE — Patient Instructions (Signed)
Ms.Deepti F Gerald, it was a pleasure seeing you today! ? ?Today we discussed: ?- Please check your sugars three times daily and return in two weeks. I'd like for you to increase your insulin to 38 units daily. ? ?- I have started you on a cholesterol medication called Zetia (ezetimibe). Please take this daily.  ? ?Referrals ordered today:  ? ? ?Referral Orders    ?     Ambulatory referral to Ophthalmology     ? ?I have ordered the following medication/changed the following medications:  ? ?Meds ordered this encounter  ?Medications  ? insulin glargine (LANTUS SOLOSTAR) 100 UNIT/ML Solostar Pen  ?  Sig: Inject 38 Units into the skin daily. INJECT 35 UNITS INTO SKIN DAILY AT 10 PM  ?  Dispense:  45 mL  ?  Refill:  5  ? ezetimibe (ZETIA) 10 MG tablet  ?  Sig: Take 1 tablet (10 mg total) by mouth daily.  ?  Dispense:  90 tablet  ?  Refill:  0  ?  ? ?Follow-up:  2 weeks   ? ?Please make sure to arrive 15 minutes prior to your next appointment. If you arrive late, you may be asked to reschedule.  ? ?We look forward to seeing you next time. Please call our clinic at [phone] if you have any questions or concerns. The best time to call is Monday-Friday from 9am-4pm, but there is someone available 24/7. If after hours or the weekend, call the main hospital number and ask for the Internal Medicine Resident On-Call. If you need medication refills, please notify your pharmacy one week in advance and they will send [place] a request. ? ?Thank you for letting us take part in your care. Wishing you the best! ? ?Thank you, ?Sanjuan Dame, MD  ?

## 2021-09-21 NOTE — Progress Notes (Deleted)
76 yo with pmh of ckd s/p kidney tranplant on immunosuppression, lipodermatosclerosi0, prior hx of DVTs, dm, hypoth ?Leg swelloing ? ? ?Organ transplact ?A/P ?Pneumnoia vacc ?Covid vacc ?shingles ? ? ?DM on chronic pred ?Lab Results  ?Component Value Date  ? HGBA1C 7.2 (A) 03/29/2020  ?  ?Medications:Lantus 35 units ?A/P: ?HgbA1c at 10.3 09/18/21 ?Ophthalmology exam ?Foot exam ?Urine microal ? ?HLD ?LDL 185 ?Total 260 ?HDL 45 ?

## 2021-09-22 ENCOUNTER — Other Ambulatory Visit: Payer: Self-pay | Admitting: Student in an Organized Health Care Education/Training Program

## 2021-09-22 DIAGNOSIS — Z794 Long term (current) use of insulin: Secondary | ICD-10-CM

## 2021-09-22 DIAGNOSIS — R0989 Other specified symptoms and signs involving the circulatory and respiratory systems: Secondary | ICD-10-CM | POA: Insufficient documentation

## 2021-09-22 NOTE — Progress Notes (Signed)
? ?  CC: regular follow-up ? ?HPI: ? ?MarthaMartha [name] is a 76 y.o. person with medical history as below presenting to [facility] for follow-up. ? ?Please see problem-based list for further details, assessments, and plans. ? ?Past Medical History:  ?Diagnosis Date  ? AAA (abdominal aortic aneurysm)   ? 3.8 cm Infrarenal managed at [facility]  ? Clotting disorder [facility])   ? PE, IVC filter placed at [facility]  ? Diabetes mellitus   ? Hypertension   ? Hypothyroidism   ? Morbid obesity ([place])   ? BMI 34  ? Renal transplant recipient   ? [facility]  ? ?Review of Systems:  As per HPI ? ?Physical Exam: ? ?Vitals:  ? 09/21/21 0846  ?BP: 137/73  ?Pulse: 86  ?SpO2: 100%  ?Weight: 220 lb 4.8 oz (99.9 kg)  ? ?General: Resting comfortably in chair, no acute distress ?CV: Regular rate, rhythm. No murmurs appreciated. 0+ distal pulses bilaterally. ?Pulm: Normal respiratory effort on room air. Clear to ausculation bilaterally. ?MSK: Normal bulk, tone. No pitting edema bilaterally. ?Skin: Warm, dry. Chronic venous stasis changes appreciated bilateral lower extremities.  ?Neuro: Awake, alert, conversing appropriately. ?Psych: Normal mood, affect, speech. ? ?Assessment & Plan:  ? 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 She reports compliance with Lantus 35 units daily. She has not been regularly checking her sugars, although does have a glucometer. She has experienced some polyuria and polydipsia along with neuropathy. Denies any recent vision changes.  ? ?Discussed with [name] importance of checking sugars daily. Today we will increase her dosage ~10% and have her return in two weeks with her glucometer. Patient verbalized understanding. ? ?- Increase Lantus 38 units daily ?- Bring glucometer to clinic in two weeks ? ?Hyperlipidemia associated with type 2 diabetes mellitus ([place]) ?Reviewed recent lab work which revealed significantly elevated LDL of 185.  Per chart review she has had elevated LDL for a few years now. She was previously on pravastatin but was stopped due to side effects. She has not had an ischemic vascular event. I discussed with [name] importance of lowering LDL to reduce any future events. She would like to avoid any statin medication. Therefore, we will initiate ezetimibe and follow-up her response. ? ?- Start ezetimibe '10mg'$  daily ?- Repeat lipid panel in one year ? ?Decreased pulses in feet ?On physical exam today, patient was found to have decreased pulses in her feet. She has not been experiencing any claudication symptoms and has no known history of peripheral arterial disease.  ? ?ABI's were performed in office today, 1.06 L, 1.12 R. Given normal results we will continue to monitor for any progression. Will be important to obtain better glycemic control to decrease risk. ? ?Health care maintenance ?- PVC20 given today ?- Ophthalmology referral placed ? ?Hypertension ?BP Readings from Last 3 Encounters:  ?09/21/21 137/73  ?05/11/21 (!) 152/83  ?11/24/20 (!) 143/73  ? ?Blood pressure improved from previous visit. She has been taking all of her medications and is asymptomatic. ? ?- Amlodipine '10mg'$  daily ?- Furosemide '40mg'$  daily ? ? ?Patient discussed with Dr. Daryll Drown ? ?Sanjuan Dame, MD ?Internal Medicine PGY-2 ?Pager: [phone] ? ?

## 2021-09-22 NOTE — Assessment & Plan Note (Signed)
-   PVC20 given today ?- Ophthalmology referral placed ?

## 2021-09-22 NOTE — Assessment & Plan Note (Signed)
Reviewed recent lab work which revealed significantly elevated LDL of 185. Per chart review she has had elevated LDL for a few years now. She was previously on pravastatin but was stopped due to side effects. She has not had an ischemic vascular event. I discussed with [name] importance of lowering LDL to reduce any future events. She would like to avoid any statin medication. Therefore, we will initiate ezetimibe and follow-up her response. ? ?- Start ezetimibe '10mg'$  daily ?- Repeat lipid panel in one year ?

## 2021-09-22 NOTE — Assessment & Plan Note (Signed)
BP Readings from Last 3 Encounters:  ?09/21/21 137/73  ?05/11/21 (!) 152/83  ?11/24/20 (!) 143/73  ? ?Blood pressure improved from previous visit. She has been taking all of her medications and is asymptomatic. ? ?- Amlodipine '10mg'$  daily ?- Furosemide '40mg'$  daily ?

## 2021-09-22 NOTE — Assessment & Plan Note (Signed)
On physical exam today, patient was found to have decreased pulses in her feet. She has not been experiencing any claudication symptoms and has no known history of peripheral arterial disease.  ? ?ABI's were performed in office today, 1.06 L, 1.12 R. Given normal results we will continue to monitor for any progression. Will be important to obtain better glycemic control to decrease risk. ?

## 2021-09-22 NOTE — Assessment & Plan Note (Signed)
Reviewed recent labs which revealed A1c 10.3% earlier this month. She reports compliance with Lantus 35 units daily. She has not been regularly checking her sugars, although does have a glucometer. She has experienced some polyuria and polydipsia along with neuropathy. Denies any recent vision changes.  ? ?Discussed with Ms. Deery importance of checking sugars daily. Today we will increase her dosage ~10% and have her return in two weeks with her glucometer. Patient verbalized understanding. ? ?- Increase Lantus 38 units daily ?- Bring glucometer to clinic in two weeks ?

## 2021-09-25 NOTE — Telephone Encounter (Signed)
[place] called in for refill on lantus today so they can mail out all her meds together. No Print option selected on Rx written 3/30. Please resend as Normal. Thanks! ? ?

## 2021-10-10 NOTE — Progress Notes (Signed)
Internal Medicine Clinic Attending ? ?Case discussed with Dr. Braswell  at the time of the visit.  We reviewed the resident?s history and exam and pertinent patient test results.  I agree with the assessment, diagnosis, and plan of care documented in the resident?s note.  ?

## 2021-10-17 ENCOUNTER — Ambulatory Visit (INDEPENDENT_AMBULATORY_CARE_PROVIDER_SITE_OTHER): Payer: Medicare Other | Admitting: Internal Medicine

## 2021-10-17 ENCOUNTER — Encounter: Payer: Self-pay | Admitting: Internal Medicine

## 2021-10-17 DIAGNOSIS — M79671 Pain in right foot: Secondary | ICD-10-CM

## 2021-10-17 DIAGNOSIS — M79673 Pain in unspecified foot: Secondary | ICD-10-CM | POA: Insufficient documentation

## 2021-10-17 DIAGNOSIS — E1143 Type 2 diabetes mellitus with diabetic autonomic (poly)neuropathy: Secondary | ICD-10-CM

## 2021-10-17 NOTE — Assessment & Plan Note (Signed)
[name] did not bring her glucose monitor with her today. She states she is compliant with her new medication regimen, but is having difficulty being consistent with her 3 daily CBG checks. Discussed follow up in 2 weeks to check her sugars, which is in agreement with.  ?- Patient to bring CBG monitor at next visit ?- Continue Lantus 38 and three time daily blood sugar checks ?- Follow up in 2 weeks ?

## 2021-10-17 NOTE — Assessment & Plan Note (Addendum)
Patient with pertinent Hx of DM presents to the clinic for R heel pain. She states that she was in her usual state of health  Until 3-4 weeks ago, when she began to experience right heel pain.  She states that the pain feels like "stepping on a needle" with no radiation.  She describes the pain as "sore."  Alleviating factors include not putting pressure on the foot.  Aggravating factors are walking and weightbearing.  She states that her heel pain progressively worsened when she would carry her 75-monthold grandchildren in her right hand.  She denies any trauma, falls, cuts or wounds of the left lower foot.  She denies that the pain is worsened in the mornings when she first gets out of bed. ? ?A/P: ?Patient presents with 3 to 4 weeks of heel pain with no radiation. She denies that the pain is worse in the mornings when she first gets out of bed. She denies any radiation,burning, or numbness of the pain. On physical examination there is pinpoint tenderness to the calcaneus with no erythema or puncture marks. 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  ?- RICE therapy ?- Provided with Planter fasciitis exercise packet ?- Referral to podiatry for insoles ? ?

## 2021-10-17 NOTE — Patient Instructions (Addendum)
It was a pleasure meeting you Ms. Weseman,  ? ?Today we discussed your right heel pain. After discussing your pain and physical examination, I am concerned that the "fat" in your heel is irritated. This is called "Heel Pad Syndrome." I am going to provide you with a packet to exercise the foot. I am also going to refer you to podiatry for consideration of insoles for your feet to help offload the pressure.  ? ?I would additionally like to have you follow [place] in 2 weeks to see how your blood sugars are doing. Please bring your glucose monitor with you at your next visit.  ? ?I hope you have a good day,  ?Maudie Mercury, MD ?

## 2021-10-17 NOTE — Progress Notes (Signed)
? ?  CC: Right Heel Pain ? ?HPI: ? ?[name] is a 76 y.o. person, with a PMH noted below, who presents to the clinic for right heel pain. To see the management of their acute and chronic conditions, please see the A&P note under the Encounters tab.  ? ?Past Medical History:  ?Diagnosis Date  ? AAA (abdominal aortic aneurysm)   ? 3.8 cm [facility]frarenal [facility] at [facility]  ? Clotting disorder [facility])   ? PE, IVC filter placed at [facility]  ? Diabetes mellitus   ? Hypertension   ? Hypothyroidism   ? Morbid obesity ([place])   ? BMI 34  ? Renal transplant recipient   ? [facility] 2010  ? ?Review of Systems:   ?ROS  ? ?Physical Exam: ? ?Vitals:  ? 10/17/21 1403  ?BP: 134/67  ?Pulse: 87  ?Temp: 98.6 ?F (37 ?C)  ?TempSrc: Oral  ?SpO2: 99%  ?Weight: 221 lb 1.6 oz (100.3 kg)  ? ?Physical Exam ?Constitutional:   ?   General: She is not in acute distress. ?   Appearance: She is not ill-appearing.  ?HENT:  ?   Head: Normocephalic and atraumatic.  ?Musculoskeletal:  ?   Right lower leg: No edema.  ?   Left lower leg: No edema.  ?Skin: ?   Comments: Xerosis of the ankles duration bilaterally in the lower extremities.  No swelling in the feet bilaterally.  Hammertoe of the second digit bilaterally, with nailbed changes.  No tenderness to palpation in the left foot. Pinpoint tenderness in the center of the calcaneus in the right foot.  ?Neurological:  ?   Mental Status: She is alert and oriented to person, place, and time.  ?Psychiatric:     ?   Mood and Affect: Mood normal.     ?   Behavior: Behavior normal.  ?  ? ?Assessment & Plan:  ? ?See Encounters Tab for problem based charting. ? ?Patient discussed with Dr.  Dr. Cain Sieve   ?

## 2021-10-18 NOTE — Progress Notes (Signed)
Internal Medicine Clinic Attending ? ?Case discussed with Dr. Winters  At the time of the visit.  We reviewed the resident?s history and exam and pertinent patient test results.  I agree with the assessment, diagnosis, and plan of care documented in the resident?s note.  ?

## 2021-10-19 ENCOUNTER — Other Ambulatory Visit: Payer: Self-pay | Admitting: Internal Medicine

## 2021-10-24 ENCOUNTER — Telehealth: Payer: Self-pay | Admitting: Dietician

## 2021-10-24 NOTE — Telephone Encounter (Signed)
-----   Message from Axel Filler, MD sent at 10/24/2021  2:52 PM EDT ----- ?Regarding: RE: dm fu ?That sounds like a nice plan. Thank you for the follow up.  ? ? ?----- Message ----- ?From: Braelynne Garinger, Chauncey Reading, RD ?Sent: 10/24/2021  11:45 AM EDT ?To: Axel Filler, MD ?Subject: dm fu                                         ? ?She was supposed to come back in 2 weeks from 4/25, so that would be next week. She is on insulin with high a1c but not checking her glucose. Would you agree with an appointment with me ASAP for professional CGM placement and the next week with me and a doctor?  ? ?Thank you! ?Butch Penny ? ? ?

## 2021-10-24 NOTE — Telephone Encounter (Signed)
Talked to [name]. She does not want to do Continuous glucose monitoring because she has to come back too many times. She would rather check her blood sugars and bring her meter to her next appointment.  She agreed to talk to DR. Vincent about Continuous glucose monitoring in the future. I suggested maybe we could get it down to one to two visits instead of 3. She agreed to call the front office to make her follow up appointment.  ?

## 2021-10-25 ENCOUNTER — Ambulatory Visit: Payer: Medicare Other | Admitting: Podiatry

## 2021-10-25 NOTE — Telephone Encounter (Signed)
Ok. That sounds reasonable. Thank you. ?

## 2021-11-01 ENCOUNTER — Ambulatory Visit: Payer: Medicare Other | Admitting: Podiatry

## 2021-12-18 ENCOUNTER — Encounter: Payer: Medicare Other | Admitting: Student in an Organized Health Care Education/Training Program

## 2021-12-29 ENCOUNTER — Emergency Department (HOSPITAL_COMMUNITY): Payer: Medicare Other

## 2021-12-29 ENCOUNTER — Encounter (HOSPITAL_COMMUNITY): Payer: Self-pay

## 2021-12-29 ENCOUNTER — Inpatient Hospital Stay (HOSPITAL_COMMUNITY)
Admission: EM | Admit: 2021-12-29 | Discharge: 2022-01-23 | DRG: 840 | Disposition: E | Payer: Medicare Other | Attending: Internal Medicine | Admitting: Internal Medicine

## 2021-12-29 ENCOUNTER — Other Ambulatory Visit: Payer: Self-pay

## 2021-12-29 DIAGNOSIS — E1122 Type 2 diabetes mellitus with diabetic chronic kidney disease: Secondary | ICD-10-CM | POA: Diagnosis present

## 2021-12-29 DIAGNOSIS — E876 Hypokalemia: Secondary | ICD-10-CM | POA: Diagnosis present

## 2021-12-29 DIAGNOSIS — Z66 Do not resuscitate: Secondary | ICD-10-CM | POA: Diagnosis not present

## 2021-12-29 DIAGNOSIS — I4891 Unspecified atrial fibrillation: Secondary | ICD-10-CM | POA: Diagnosis not present

## 2021-12-29 DIAGNOSIS — E872 Acidosis, unspecified: Secondary | ICD-10-CM | POA: Diagnosis present

## 2021-12-29 DIAGNOSIS — E869 Volume depletion, unspecified: Secondary | ICD-10-CM | POA: Diagnosis present

## 2021-12-29 DIAGNOSIS — I4892 Unspecified atrial flutter: Secondary | ICD-10-CM | POA: Diagnosis not present

## 2021-12-29 DIAGNOSIS — Z79890 Hormone replacement therapy (postmenopausal): Secondary | ICD-10-CM

## 2021-12-29 DIAGNOSIS — E8770 Fluid overload, unspecified: Secondary | ICD-10-CM | POA: Diagnosis not present

## 2021-12-29 DIAGNOSIS — R6510 Systemic inflammatory response syndrome (SIRS) of non-infectious origin without acute organ dysfunction: Secondary | ICD-10-CM | POA: Diagnosis present

## 2021-12-29 DIAGNOSIS — G9341 Metabolic encephalopathy: Secondary | ICD-10-CM | POA: Diagnosis present

## 2021-12-29 DIAGNOSIS — Z888 Allergy status to other drugs, medicaments and biological substances status: Secondary | ICD-10-CM

## 2021-12-29 DIAGNOSIS — Z7952 Long term (current) use of systemic steroids: Secondary | ICD-10-CM

## 2021-12-29 DIAGNOSIS — Z6834 Body mass index (BMI) 34.0-34.9, adult: Secondary | ICD-10-CM

## 2021-12-29 DIAGNOSIS — E875 Hyperkalemia: Secondary | ICD-10-CM | POA: Diagnosis present

## 2021-12-29 DIAGNOSIS — E1165 Type 2 diabetes mellitus with hyperglycemia: Secondary | ICD-10-CM | POA: Diagnosis present

## 2021-12-29 DIAGNOSIS — E785 Hyperlipidemia, unspecified: Secondary | ICD-10-CM | POA: Diagnosis present

## 2021-12-29 DIAGNOSIS — R011 Cardiac murmur, unspecified: Secondary | ICD-10-CM | POA: Diagnosis present

## 2021-12-29 DIAGNOSIS — N179 Acute kidney failure, unspecified: Secondary | ICD-10-CM | POA: Diagnosis present

## 2021-12-29 DIAGNOSIS — J9601 Acute respiratory failure with hypoxia: Secondary | ICD-10-CM | POA: Diagnosis not present

## 2021-12-29 DIAGNOSIS — I7143 Infrarenal abdominal aortic aneurysm, without rupture: Secondary | ICD-10-CM | POA: Diagnosis not present

## 2021-12-29 DIAGNOSIS — C9010 Plasma cell leukemia not having achieved remission: Secondary | ICD-10-CM | POA: Diagnosis not present

## 2021-12-29 DIAGNOSIS — I129 Hypertensive chronic kidney disease with stage 1 through stage 4 chronic kidney disease, or unspecified chronic kidney disease: Secondary | ICD-10-CM | POA: Diagnosis present

## 2021-12-29 DIAGNOSIS — E559 Vitamin D deficiency, unspecified: Secondary | ICD-10-CM | POA: Diagnosis present

## 2021-12-29 DIAGNOSIS — F419 Anxiety disorder, unspecified: Secondary | ICD-10-CM | POA: Diagnosis not present

## 2021-12-29 DIAGNOSIS — Z79899 Other long term (current) drug therapy: Secondary | ICD-10-CM

## 2021-12-29 DIAGNOSIS — Z7960 Long term (current) use of unspecified immunomodulators and immunosuppressants: Secondary | ICD-10-CM

## 2021-12-29 DIAGNOSIS — Z86718 Personal history of other venous thrombosis and embolism: Secondary | ICD-10-CM

## 2021-12-29 DIAGNOSIS — Z20822 Contact with and (suspected) exposure to covid-19: Secondary | ICD-10-CM | POA: Diagnosis present

## 2021-12-29 DIAGNOSIS — Z803 Family history of malignant neoplasm of breast: Secondary | ICD-10-CM

## 2021-12-29 DIAGNOSIS — I10 Essential (primary) hypertension: Secondary | ICD-10-CM | POA: Diagnosis present

## 2021-12-29 DIAGNOSIS — Z794 Long term (current) use of insulin: Secondary | ICD-10-CM

## 2021-12-29 DIAGNOSIS — D84821 Immunodeficiency due to drugs: Secondary | ICD-10-CM | POA: Diagnosis present

## 2021-12-29 DIAGNOSIS — E1143 Type 2 diabetes mellitus with diabetic autonomic (poly)neuropathy: Secondary | ICD-10-CM | POA: Diagnosis present

## 2021-12-29 DIAGNOSIS — N189 Chronic kidney disease, unspecified: Principal | ICD-10-CM

## 2021-12-29 DIAGNOSIS — Z940 Kidney transplant status: Secondary | ICD-10-CM

## 2021-12-29 DIAGNOSIS — D631 Anemia in chronic kidney disease: Secondary | ICD-10-CM | POA: Diagnosis present

## 2021-12-29 DIAGNOSIS — E039 Hypothyroidism, unspecified: Secondary | ICD-10-CM | POA: Diagnosis present

## 2021-12-29 DIAGNOSIS — Y830 Surgical operation with transplant of whole organ as the cause of abnormal reaction of the patient, or of later complication, without mention of misadventure at the time of the procedure: Secondary | ICD-10-CM | POA: Diagnosis present

## 2021-12-29 DIAGNOSIS — E871 Hypo-osmolality and hyponatremia: Secondary | ICD-10-CM | POA: Diagnosis present

## 2021-12-29 DIAGNOSIS — Z91040 Latex allergy status: Secondary | ICD-10-CM

## 2021-12-29 DIAGNOSIS — Z515 Encounter for palliative care: Secondary | ICD-10-CM

## 2021-12-29 DIAGNOSIS — Z91148 Patient's other noncompliance with medication regimen for other reason: Secondary | ICD-10-CM

## 2021-12-29 DIAGNOSIS — T8619 Other complication of kidney transplant: Secondary | ICD-10-CM | POA: Diagnosis present

## 2021-12-29 DIAGNOSIS — D696 Thrombocytopenia, unspecified: Secondary | ICD-10-CM | POA: Diagnosis present

## 2021-12-29 DIAGNOSIS — N184 Chronic kidney disease, stage 4 (severe): Secondary | ICD-10-CM | POA: Diagnosis present

## 2021-12-29 LAB — CBC WITH DIFFERENTIAL/PLATELET
Abs Immature Granulocytes: 0 10*3/uL (ref 0.00–0.07)
Basophils Absolute: 0 10*3/uL (ref 0.0–0.1)
Basophils Relative: 0 %
Eosinophils Absolute: 0 10*3/uL (ref 0.0–0.5)
Eosinophils Relative: 0 %
HCT: 23.9 % — ABNORMAL LOW (ref 36.0–46.0)
Hemoglobin: 8.2 g/dL — ABNORMAL LOW (ref 12.0–15.0)
Lymphocytes Relative: 59 %
Lymphs Abs: 20 10*3/uL — ABNORMAL HIGH (ref 0.7–4.0)
MCH: 28.3 pg (ref 26.0–34.0)
MCHC: 34.3 g/dL (ref 30.0–36.0)
MCV: 82.4 fL (ref 80.0–100.0)
Monocytes Absolute: 2.4 10*3/uL — ABNORMAL HIGH (ref 0.1–1.0)
Monocytes Relative: 7 %
Neutro Abs: 11.5 10*3/uL — ABNORMAL HIGH (ref 1.7–7.7)
Neutrophils Relative %: 34 %
Platelets: 101 10*3/uL — ABNORMAL LOW (ref 150–400)
RBC: 2.9 MIL/uL — ABNORMAL LOW (ref 3.87–5.11)
RDW: 17.9 % — ABNORMAL HIGH (ref 11.5–15.5)
Smear Review: UNDETERMINED
WBC: 33.9 10*3/uL — ABNORMAL HIGH (ref 4.0–10.5)
nRBC: 0.2 % (ref 0.0–0.2)
nRBC: 1 /100 WBC — ABNORMAL HIGH

## 2021-12-29 LAB — LIPID PANEL
Cholesterol: 124 mg/dL (ref 0–200)
HDL: <10 mg/dL — ABNORMAL LOW (ref >40)
LDL Cholesterol: UNDETERMINED mg/dL (ref 0–99)
Triglycerides: 420 mg/dL — ABNORMAL HIGH (ref <150)
VLDL: UNDETERMINED mg/dL (ref 0–40)

## 2021-12-29 LAB — HEMOGLOBIN A1C
Hgb A1c MFr Bld: 9.7 % — ABNORMAL HIGH (ref 4.8–5.6)
Mean Plasma Glucose: 231.69 mg/dL

## 2021-12-29 LAB — I-STAT VENOUS BLOOD GAS, ED
Acid-base deficit: 4 mmol/L — ABNORMAL HIGH (ref 0.0–2.0)
Bicarbonate: 21.8 mmol/L (ref 20.0–28.0)
Calcium, Ion: 1.45 mmol/L — ABNORMAL HIGH (ref 1.15–1.40)
HCT: 30 % — ABNORMAL LOW (ref 36.0–46.0)
Hemoglobin: 10.2 g/dL — ABNORMAL LOW (ref 12.0–15.0)
O2 Saturation: 98 %
Potassium: 5.3 mmol/L — ABNORMAL HIGH (ref 3.5–5.1)
Sodium: 134 mmol/L — ABNORMAL LOW (ref 135–145)
TCO2: 23 mmol/L (ref 22–32)
pCO2, Ven: 40.2 mmHg — ABNORMAL LOW (ref 44–60)
pH, Ven: 7.342 (ref 7.25–7.43)
pO2, Ven: 107 mmHg — ABNORMAL HIGH (ref 32–45)

## 2021-12-29 LAB — GLUCOSE, CAPILLARY: Glucose-Capillary: 292 mg/dL — ABNORMAL HIGH (ref 70–99)

## 2021-12-29 LAB — COMPREHENSIVE METABOLIC PANEL
ALT: 34 U/L (ref 0–44)
AST: 30 U/L (ref 15–41)
Albumin: 3.7 g/dL (ref 3.5–5.0)
Alkaline Phosphatase: 61 U/L (ref 38–126)
Anion gap: 14 (ref 5–15)
BUN: 70 mg/dL — ABNORMAL HIGH (ref 8–23)
CO2: 17 mmol/L — ABNORMAL LOW (ref 22–32)
Calcium: 10.6 mg/dL — ABNORMAL HIGH (ref 8.9–10.3)
Chloride: 100 mmol/L (ref 98–111)
Creatinine, Ser: 3.55 mg/dL — ABNORMAL HIGH (ref 0.44–1.00)
GFR, Estimated: 13 mL/min — ABNORMAL LOW (ref >60)
Glucose, Bld: 454 mg/dL — ABNORMAL HIGH (ref 70–99)
Potassium: 5.4 mmol/L — ABNORMAL HIGH (ref 3.5–5.1)
Sodium: 131 mmol/L — ABNORMAL LOW (ref 135–145)
Total Bilirubin: 1.6 mg/dL — ABNORMAL HIGH (ref 0.3–1.2)
Total Protein: 6.3 g/dL — ABNORMAL LOW (ref 6.5–8.1)

## 2021-12-29 LAB — BASIC METABOLIC PANEL
Anion gap: 13 (ref 5–15)
BUN: 70 mg/dL — ABNORMAL HIGH (ref 8–23)
CO2: 18 mmol/L — ABNORMAL LOW (ref 22–32)
Calcium: 10.6 mg/dL — ABNORMAL HIGH (ref 8.9–10.3)
Chloride: 107 mmol/L (ref 98–111)
Creatinine, Ser: 3.16 mg/dL — ABNORMAL HIGH (ref 0.44–1.00)
GFR, Estimated: 15 mL/min — ABNORMAL LOW (ref >60)
Glucose, Bld: 306 mg/dL — ABNORMAL HIGH (ref 70–99)
Potassium: 5.1 mmol/L (ref 3.5–5.1)
Sodium: 138 mmol/L (ref 135–145)

## 2021-12-29 LAB — PROTIME-INR
INR: 1.3 — ABNORMAL HIGH (ref 0.8–1.2)
Prothrombin Time: 15.9 seconds — ABNORMAL HIGH (ref 11.4–15.2)

## 2021-12-29 LAB — IRON AND TIBC
Iron: 110 ug/dL (ref 28–170)
Saturation Ratios: 51 % — ABNORMAL HIGH (ref 10.4–31.8)
TIBC: 216 ug/dL — ABNORMAL LOW (ref 250–450)
UIBC: 106 ug/dL

## 2021-12-29 LAB — PHOSPHORUS: Phosphorus: 4.5 mg/dL (ref 2.5–4.6)

## 2021-12-29 LAB — RESP PANEL BY RT-PCR (FLU A&B, COVID) ARPGX2
Influenza A by PCR: NEGATIVE
Influenza B by PCR: NEGATIVE
SARS Coronavirus 2 by RT PCR: NEGATIVE

## 2021-12-29 LAB — CK: Total CK: 33 U/L — ABNORMAL LOW (ref 38–234)

## 2021-12-29 LAB — CBG MONITORING, ED
Glucose-Capillary: 443 mg/dL — ABNORMAL HIGH (ref 70–99)
Glucose-Capillary: 360 mg/dL — ABNORMAL HIGH (ref 70–99)

## 2021-12-29 LAB — LACTIC ACID, PLASMA
Lactic Acid, Venous: 2.3 mmol/L (ref 0.5–1.9)
Lactic Acid, Venous: 1.9 mmol/L (ref 0.5–1.9)

## 2021-12-29 LAB — TROPONIN I (HIGH SENSITIVITY)
Troponin I (High Sensitivity): 23 ng/L — ABNORMAL HIGH (ref <18)
Troponin I (High Sensitivity): 23 ng/L — ABNORMAL HIGH (ref <18)

## 2021-12-29 LAB — FERRITIN: Ferritin: 2090 ng/mL — ABNORMAL HIGH (ref 11–307)

## 2021-12-29 LAB — VITAMIN B12: Vitamin B-12: 1639 pg/mL — ABNORMAL HIGH (ref 180–914)

## 2021-12-29 LAB — APTT: aPTT: 23 seconds — ABNORMAL LOW (ref 24–36)

## 2021-12-29 LAB — LIPASE, BLOOD: Lipase: 26 U/L (ref 11–51)

## 2021-12-29 LAB — TSH: TSH: <0.01 u[IU]/mL — ABNORMAL LOW (ref 0.350–4.500)

## 2021-12-29 LAB — FOLATE: Folate: >40 ng/mL (ref >5.9)

## 2021-12-29 LAB — VITAMIN D 25 HYDROXY (VIT D DEFICIENCY, FRACTURES): Vit D, 25-Hydroxy: 38.47 ng/mL (ref 30–100)

## 2021-12-29 LAB — MAGNESIUM: Magnesium: 1.7 mg/dL (ref 1.7–2.4)

## 2021-12-29 MED ORDER — SODIUM CHLORIDE 0.9 % IV SOLN
2.0000 g | Freq: Once | INTRAVENOUS | Status: AC
  Administered 2021-12-29: 2 g via INTRAVENOUS
  Filled 2021-12-29: qty 12.5

## 2021-12-29 MED ORDER — LACTATED RINGERS IV SOLN: INTRAVENOUS | Status: DC

## 2021-12-29 MED ORDER — HEPARIN SODIUM (PORCINE) 5000 UNIT/ML IJ SOLN
5000.0000 [IU] | Freq: Three times a day (TID) | INTRAMUSCULAR | Status: DC
  Administered 2021-12-29 – 2022-01-01 (×8): 5000 [IU] via SUBCUTANEOUS
  Filled 2021-12-29 (×8): qty 1

## 2021-12-29 MED ORDER — INSULIN GLARGINE-YFGN 100 UNIT/ML ~~LOC~~ SOLN
20.0000 [IU] | Freq: Every day | SUBCUTANEOUS | Status: DC
  Administered 2021-12-29: 20 [IU] via SUBCUTANEOUS
  Filled 2021-12-29 (×2): qty 0.2

## 2021-12-29 MED ORDER — ONDANSETRON HCL 4 MG PO TABS: 4.0000 mg | ORAL_TABLET | Freq: Four times a day (QID) | ORAL | Status: DC | PRN

## 2021-12-29 MED ORDER — LEVOTHYROXINE SODIUM 100 MCG PO TABS: 100.0000 ug | ORAL_TABLET | Freq: Every day | ORAL | Status: DC

## 2021-12-29 MED ORDER — ONDANSETRON HCL 4 MG/2ML IJ SOLN
4.0000 mg | Freq: Four times a day (QID) | INTRAMUSCULAR | Status: DC | PRN
  Administered 2021-12-31 (×2): 4 mg via INTRAVENOUS
  Filled 2021-12-29 (×2): qty 2

## 2021-12-29 MED ORDER — SENNOSIDES-DOCUSATE SODIUM 8.6-50 MG PO TABS: 1.0000 | ORAL_TABLET | Freq: Every evening | ORAL | Status: DC | PRN

## 2021-12-29 MED ORDER — SODIUM CHLORIDE 0.9 % IV SOLN
2.0000 g | INTRAVENOUS | Status: DC
  Administered 2021-12-30 – 2021-12-31 (×2): 2 g via INTRAVENOUS
  Filled 2021-12-29 (×3): qty 12.5

## 2021-12-29 MED ORDER — PENTOXIFYLLINE ER 400 MG PO TBCR
400.0000 mg | EXTENDED_RELEASE_TABLET | Freq: Two times a day (BID) | ORAL | Status: DC
  Administered 2021-12-29 – 2021-12-31 (×5): 400 mg via ORAL
  Filled 2021-12-29 (×10): qty 1

## 2021-12-29 MED ORDER — ACETAMINOPHEN 650 MG RE SUPP
650.0000 mg | Freq: Four times a day (QID) | RECTAL | Status: DC | PRN
  Administered 2022-01-01 – 2022-01-03 (×5): 650 mg via RECTAL
  Filled 2021-12-29 (×5): qty 1

## 2021-12-29 MED ORDER — METRONIDAZOLE 500 MG/100ML IV SOLN
500.0000 mg | Freq: Once | INTRAVENOUS | Status: AC
  Administered 2021-12-29: 500 mg via INTRAVENOUS
  Filled 2021-12-29: qty 100

## 2021-12-29 MED ORDER — PANTOPRAZOLE SODIUM 40 MG PO TBEC
40.0000 mg | DELAYED_RELEASE_TABLET | Freq: Every day | ORAL | Status: DC
  Administered 2021-12-29 – 2021-12-31 (×3): 40 mg via ORAL
  Filled 2021-12-29 (×4): qty 1

## 2021-12-29 MED ORDER — VANCOMYCIN HCL 2000 MG/400ML IV SOLN
2000.0000 mg | Freq: Once | INTRAVENOUS | Status: AC
  Administered 2021-12-29: 2000 mg via INTRAVENOUS
  Filled 2021-12-29: qty 400

## 2021-12-29 MED ORDER — INSULIN ASPART 100 UNIT/ML IJ SOLN
10.0000 [IU] | Freq: Once | INTRAMUSCULAR | Status: AC
Start: 2021-12-29 — End: 2021-12-29
  Administered 2021-12-29: 10 [IU] via SUBCUTANEOUS

## 2021-12-29 MED ORDER — ASPIRIN 81 MG PO TBEC
81.0000 mg | DELAYED_RELEASE_TABLET | Freq: Every day | ORAL | Status: DC
  Administered 2021-12-29 – 2021-12-31 (×3): 81 mg via ORAL
  Filled 2021-12-29 (×4): qty 1

## 2021-12-29 MED ORDER — SODIUM CHLORIDE 0.9 % IV BOLUS
1000.0000 mL | Freq: Once | INTRAVENOUS | Status: AC
  Administered 2021-12-29: 1000 mL via INTRAVENOUS

## 2021-12-29 MED ORDER — VANCOMYCIN VARIABLE DOSE PER UNSTABLE RENAL FUNCTION (PHARMACIST DOSING): Status: DC

## 2021-12-29 MED ORDER — ACETAMINOPHEN 325 MG PO TABS
650.0000 mg | ORAL_TABLET | Freq: Four times a day (QID) | ORAL | Status: DC | PRN
  Administered 2021-12-29 – 2021-12-31 (×3): 650 mg via ORAL
  Filled 2021-12-29 (×4): qty 2

## 2021-12-29 MED ORDER — RIVAROXABAN 10 MG PO TABS: 10.0000 mg | ORAL_TABLET | Freq: Every day | ORAL | Status: DC

## 2021-12-29 MED ORDER — INSULIN ASPART 100 UNIT/ML IJ SOLN
0.0000 [IU] | INTRAMUSCULAR | Status: DC
  Administered 2021-12-29: 8 [IU] via SUBCUTANEOUS
  Administered 2021-12-30: 5 [IU] via SUBCUTANEOUS
  Administered 2021-12-30: 3 [IU] via SUBCUTANEOUS
  Administered 2021-12-30: 5 [IU] via SUBCUTANEOUS
  Administered 2021-12-30: 11 [IU] via SUBCUTANEOUS
  Administered 2021-12-30: 3 [IU] via SUBCUTANEOUS

## 2021-12-29 NOTE — Progress Notes (Signed)
Sepsis bundle delayed due to kidney biopsy and multiple radiographic procedures.

## 2021-12-29 NOTE — H&P (Signed)
Date: 01/07/2022               Patient Name:  [name] MRN: [id number]  DOB: 1946-04-22 Age / Sex: 76 y.o., female   PCP: Axel Filler, MD         Medical Service: Internal Medicine Teaching Service         Attending Physician: Dr. Ezequiel Essex, MD    First Contact: Dr. Cherrie Distance Pager: [phone]  Second Contact: Dr. Coy Saunas Pager: [phone]       After Hours (After 5p/  First Contact Pager: [phone]  weekends / holidays): Second Contact Pager: [phone]   Chief Complaint: Body aches, weakness, poor appetite, decreased thirst  History of Present Illness: [name] is a 76 year old female with history of type 2 diabetes, renal transplant in 2010, abdominal aortic aneurysm, hypertension who presents to the emergency department with generalized aches and pains, weakness for 3 weeks and poor appetite and decreased thirst for approximately 1 week.  The pain seems to originate in the low back and is exacerbated by moving.  She also has pain in bilateral knees.  The patient also endorses an episode of nausea, vomiting, and diarrhea.  The diarrhea has resolved but she is still nauseous today.  She is accompanied in the emergency room by her son who reports that she came to the ER today at her family's insistence, primarily because of her decreased appetite and thirst over the last 4 days.  Her son reports that her condition seems to be declining since about 3 weeks ago.  Neither patient nor her son can pinpoint a precipitating factor for this decline.  The patient cannot think of any changes to her medicines or daily habits that coincide with the onset of her symptoms  Work-up in the emergency department revealed significant leukocytosis, elevated creatinine, hyperkalemia, metabolic acidosis, lactic acidosis, and anemia.  Code sepsis was initiated although there is no clear source of infection.  Due to concern for acute renal failure in this transplant patient nephrology was  also consulted.  Internal medicine teaching program was consulted to admit this patient for continued work-up, observation, and treatment  Meds:  Current Meds  Medication Sig   amLODipine (NORVASC) 10 MG tablet Take 1 tablet (10 mg total) by mouth at bedtime.   cholecalciferol (VITAMIN D3) 25 MCG (1000 UNIT) tablet Take 1,000 Units by mouth daily.   fluticasone (FLONASE) 50 MCG/ACT nasal spray SHAKE LIQUID AND USE 1 SPRAY IN EACH NOSTRIL DAILY (Patient taking differently: Place 1 spray into both nostrils daily as needed for allergies.)   furosemide (LASIX) 40 MG tablet TAKE 1 TABLET(40 MG) BY MOUTH DAILY (Patient taking differently: Take 40 mg by mouth daily.)   hydrocortisone 2.5 % cream Apply to legs as needed daily (Patient taking differently: Apply 1 Application topically daily as needed (itching).)   insulin glargine (LANTUS SOLOSTAR) 100 UNIT/ML Solostar Pen Inject 38 Units into the skin daily.   levothyroxine (SYNTHROID) 100 MCG tablet Take 1 tablet (100 mcg total) by mouth daily.   Multiple Vitamin (MULTIVITAMIN WITH MINERALS) TABS tablet Take 1 tablet by mouth daily.   mycophenolate (CELLCEPT) 250 MG capsule Take 2 capsules (500 mg total) by mouth 2 (two) times daily.   pantoprazole (PROTONIX) 40 MG tablet Take 1 tablet (40 mg total) by mouth daily.   pentoxifylline (TRENTAL) 400 MG CR tablet Take 400 mg by mouth daily.   predniSONE (DELTASONE) 5 MG tablet Take 1 tablet (5 mg total)  by mouth daily with breakfast.   sulfamethoxazole-trimethoprim (BACTRIM) 400-80 MG tablet Take 1 tablet by mouth every Monday, Wednesday, and Friday.   tacrolimus (PROGRAF) 1 MG capsule Take 5 capsules (5 mg total) by mouth 2 (two) times daily. Takes 4.'5mg'$  total twice daily (Patient taking differently: Take 5 mg by mouth 2 (two) times daily.)    Allergies: Allergies as of 12/25/2021 - Review Complete 01/06/2022  Allergen Reaction Noted   Hectorol [doxercalciferol] Anaphylaxis and Shortness Of Breath  07/14/2011   Latex Anaphylaxis and Other (See Comments) 09/09/2018   Other Rash 08/05/2007   Vitamin d analogs Anaphylaxis and Shortness Of Breath 05/14/2014   Coumadin [warfarin sodium] Other (See Comments) 07/14/2011   Lisinopril Cough 06/22/2014   Past Medical History:  Diagnosis Date   AAA (abdominal aortic aneurysm) (HCC)    3.8 cm Infrarenal managed at [facility]   Clotting disorder ([place])    PE, IVC filter placed at [facility]   Diabetes mellitus    Hypertension    Hypothyroidism    Morbid obesity ([employer])    BMI 34   Renal transplant recipient    [facility]    Family History: Renal failure and unknown family member Breast cancer in sister (diagnosed in her 72s)  Social History: Lives at home with son Used to be a CNA/nurse tech  Review of Systems: A complete ROS was negative except as per HPI.  Physical Exam: Blood pressure (!) 131/45, pulse 95, temperature 98.6 F (37 C), resp. rate (!) 25, height '5\' 7"'$  (1.702 m), weight 100.3 kg, SpO2 98 %. General: Tired appearing female shifting uncomfortably in hospital stretcher HEENT: Poor dentition. Pupils equal and reactive to light Cardiovascular: Regular rate and rhythm. Systolic flow murmur. Respiratory: CTAB. Normal effort. Distant crackles at left lower lobe Abdominal: Mild periumbilical tenderness to deep palpation. Abdomen soft, nondistended. Extremities: Bilateral radial pulses +2. Non-palpable  Skin: Hyperpigmented patch over right shin. Chronic venous stasis changes Neuro: Strength equal in upper and lower extremities. Tongue midline. Cheeks puff symmetrically.  EKG: Sinus rhythm no evidence of acute ischemia  CXR: No acute cardiopulmonary disease. Blunted left costophrenic angle that is unchanged from previous.   CT A/P: Not revealing for any potential source of infection  Assessment & Plan  [name] is a 76 y.o. with a PMH of T2DM with autonomic neuropathy, renal transplant in 2010, abdominal aortic  aneurysm s/p iliac stent, HTN, hypothyroidism, osteoarthritis, HLD and CKD 3 who presented with generalized muscle aches, progressive malaise and poor appetite and found to meet SIRS criteria and in acute on chronic renal failure.   Active Problems:   * No active hospital problems. *  #SIRS #Leukocytosis #Lactic acidosis, resolved #Generalized weakness Patient with a history of renal transplant on immunosuppressive medications found to have progressive generalized weakness and poor appetite. On admission, white count elevated to 33.9 with with associated RR 25 low-grade temp of 99.1. BP stable with SBP in the 110s to 130s. Patient found to have mildly elevated lactic acid to 2.3, which improved to 1.9 with IV fluids. Patient's mental status remained at baseline patient has no focal neurological deficits. CT A/P does not show any intra-abdominal source of infection. Lipase and CK wnl. CXR negative for pneumonia or other signs of infection. COVID-negative. Skin exam relatively unremarkable.  At this point, there is no identifiable source of infection. UA pending. Due to patient's immunocompromise state, we will continue broad-spectrum antibiotics today and reassess tomorrow. -Continue Vanco and cefepime -Follow-up UA, blood culture  urine culture -IV LR at 125 mL/hr -PT/OT eval and treat  #AKI on CKD 3 #AGMA #Hx of renal transplant Patient with a history of renal transplant in 2010 followed by transplant team at [facility]. Currently on immunosuppression with prednisone, CellCept and Prograf. Labs from [facility] on 10/19/2021 shows BUN/sCr of 28/1.61. Labs on admission shows BUN/sCr of 70/3.55 with bicarb of 17 and anion gap of 14.  Renal ultrasound w/ Doppler negative for hydronephrosis. AKI on CKD likely secondary to volume depletion in the setting of osmotic diuresis and poor p.o. intake.  -Nephrology consulted, appreciate rec -IV LR at 125 mL/h -F/u UA, urine studies, urine  culture -Follow-up tacrolimus levels -Strict I&O's -Avoid nephrotoxins agents -Hold home Lasix  #Severe hyperglycemia #T2DM, uncontrolled #Diabetic neuropathy Patient with a history of uncontrolled diabetes her last A1c of 10.3% 3 months ago. Patient's Lantus was increased from 35 units daily to 38 units daily at her PCP office 3 months ago. CMP today showed BS of 454 with mild anion gap metabolic acidosis. Repeat CBG still elevated at 360. Patient report recent poor appetite.  Patient dry on exam likely secondary to osmotic diuresis. Pending urinalysis to assess for ketones.  -IV LR at 125 mL/h -NovoLog 10 units x1 dose -Semglee 20 units at bedtime -SSI, CBG monitor -Follow-up repeat A1c  Abdominal aortic aneurysm Aorto-iliac stent CT A/P today shows slight increase in aneurysmal sac to 7 cm from 5.7 cm on CT C/A/P on 08/02/2021.  Vascular surgery consulted and they recommended close monitoring for now. Recent ABI within normal limits. Distal extremities are warm however patient has decreased pulses on exam. Patient has follow-up with vascular surgery at [place] in August -VVS consulted, appreciate rec -Continue pentoxifylline 400 mg twice daily  #Hyperkalemia #Hyponatremia Sodium 131 on admission which improved to 137 however corrected for glucose of 454. Potassium 5.4 likely in the setting of acute on chronic renal failure. We will continue IV fluids and monitor with repeat BMP -Follow-up repeat BMP -F/u mag, Phos  #Normocytic anemia #Thrombocytopenia CBC today shows hemoglobin 8.2 and platelet 101, dropped from 11.2 and 204 in September 2021 respectively. Recent CBC on 10/19/2021 showed Hgb 10.9 and platelet 229. Patient has no evidence of active bleed and no bruising on exam. Thrombocytopenia and anemia likely secondary to patient's immunosuppressive medications however we will continue to monitor closely. -F/u iron studies -F/u vitamin B12, folate -F/I Smear -Trend  CBC  #Troponinemia Patient found to have mild elevation in troponin to 23. She endorses pain all over but denies specifically chest pain. EKG shows J-point elevation and abnormal R wave progression but no ST elevation or T wave inversions. Troponinemia likely secondary to demand ischemia in the setting of possible sepsis and DKA. -Follow-up repeat troponin -Telemetry  #Hypertension: Holding home amlodipine 10 mg daily #Hypothyroidism: Resume Synthroid follow-up repeat TSH #Hyperlipidemia: Follow-up lipid panel #Vitamin D deficiency: Follow-up vitamin D level  Dispo: Admit patient to Observation with expected length of stay less than 2 midnights.  Signed: Lacinda Axon, MD 12/30/2021, 6:49 PM  Pager: [phone] After 5pm on weekdays and 1pm on weekends: On Call pager: [phone]

## 2021-12-29 NOTE — ED Provider Notes (Signed)
[facility] EMERGENCY DEPARTMENT Provider Note   CSN: [id number] Arrival date & time: 12/26/2021  1540     History {Add pertinent medical, surgical, social history, OB history to HPI:1} Chief Complaint  Patient presents with   Generalized Body Aches    [name] is a 76 y.o. female with a history of hypertension, diabetes mellitus, hypothyroidism, abdominal aortic aneurysm, renal transplant in 2010 CellCept and Prograf, prior DVT, and CKD who presents to the emergency department with her daughter with complaints of generalized body aches intermittently over the past 2 weeks.  Patient states she is having achiness throughout her entire body, is worse in the trunk, aggravated by movement of any kind, improved as she rest/sleeps.  Pain occurring intermittently, progressively worsening, she is also had some decreased p.o. intake and a couple episodes of vomiting.  Her daughter arrived from [place] today to visit and states that she was concerned and brought her to the ER, states that she does not seem her normal self, she is much more tired.  Patient denies any recent medication changes.  She denies fever, chills, dysuria, dyspnea, or cough.  HPI     Home Medications Prior to Admission medications   Medication Sig Start Date End Date Taking? Authorizing Provider  Accu-Chek Softclix Lancets lancets Check blood sugar up to 3 times a day  as instructed 12/02/19   Axel Filler, MD  amLODipine (NORVASC) 10 MG tablet Take 1 tablet (10 mg total) by mouth at bedtime. 05/11/21   Orvis Brill, MD  cholecalciferol (VITAMIN D3) 25 MCG (1000 UNIT) tablet Take 1,000 Units by mouth daily.    [provider]  ezetimibe (ZETIA) 10 MG tablet Take 1 tablet (10 mg total) by mouth daily. 09/21/21 12/20/21  Sanjuan Dame, MD  fluticasone (FLONASE) 50 MCG/ACT nasal spray SHAKE LIQUID AND USE 1 SPRAY IN [facility] NOSTRIL DAILY 07/13/21   Axel Filler, MD   furosemide (LASIX) 40 MG tablet TAKE 1 TABLET(40 MG) BY MOUTH DAILY 05/11/21   Orvis Brill, MD  glucose blood (ACCU-CHEK AVIVA) test strip Use to test blood sugars three times a day. Dx code:E11.9 12/02/19   Axel Filler, MD  guaiFENesin-dextromethorphan [facility] DM) 100-10 MG/5ML syrup Take 5 mLs by mouth every 4 (four) hours as needed for cough. 05/11/21   Orvis Brill, MD  hydrocortisone 2.5 % cream Apply to legs as needed daily 09/13/21   Axel Filler, MD  insulin glargine (LANTUS SOLOSTAR) 100 UNIT/ML Solostar Pen Inject 38 Units into the skin daily. 09/25/21   Lucious Groves, DO  Insulin Pen Needle (BD PEN NEEDLE NANO U/F) 32G X 4 MM MISC USE TO GIVE INSULIN 4 TIMES A DAY 07/05/21   Axel Filler, MD  levothyroxine (SYNTHROID) 100 MCG tablet Take 1 tablet (100 mcg total) by mouth daily. 05/11/21   Orvis Brill, MD  Magnesium 500 MG TABS Take 500 mg by mouth 2 (two) times daily.    [provider]  Multiple Vitamin (MULTIVITAMIN WITH MINERALS) TABS tablet Take 1 tablet by mouth daily.    [provider]  Multiple Vitamins-Minerals (PRESERVISION AREDS) CAPS Take 1 capsule by mouth 2 (two) times daily.    [provider]  mycophenolate (CELLCEPT) 250 MG capsule Take 2 capsules (500 mg total) by mouth 2 (two) times daily. 05/20/14   Burns, Arloa Koh, MD  pantoprazole (PROTONIX) 40 MG tablet Take 1 tablet (40 mg total) by mouth daily. 05/11/21  Orvis Brill, MD  pentoxifylline (TRENTAL) 400 MG CR tablet Take 400 mg by mouth 2 (two) times daily. 03/13/16   [provider]  polyethylene glycol (MIRALAX MIX-IN PAX) 17 g packet Take 17 g by mouth daily. 03/10/20   Seawell, Jaimie A, DO  predniSONE (DELTASONE) 5 MG tablet Take 1 tablet (5 mg total) by mouth daily with breakfast. 05/11/21   Orvis Brill, MD  sulfamethoxazole-trimethoprim (BACTRIM) 400-80 MG tablet Take 1 tablet by mouth every Monday,  Wednesday, and Friday. 10/20/21   Axel Filler, MD  tacrolimus (PROGRAF) 1 MG capsule Take 5 capsules (5 mg total) by mouth 2 (two) times daily. Takes 4.'5mg'$  total twice daily 05/11/21   Orvis Brill, MD      Allergies    Hectorol [doxercalciferol], Latex, Other, Vitamin d analogs, Coumadin [warfarin sodium], and Lisinopril    Review of Systems   Review of Systems  Constitutional:  Positive for fatigue. Negative for chills and fever.  Respiratory:  Negative for cough and shortness of breath.   Cardiovascular:  Positive for chest pain.  Gastrointestinal:  Positive for abdominal pain, nausea and vomiting. Negative for blood in stool.  Genitourinary:  Negative for dysuria.  Musculoskeletal:  Positive for arthralgias, back pain and myalgias.  Skin:  Negative for rash.  All other systems reviewed and are negative.   Physical Exam Updated Vital Signs BP (!) 131/49 (BP Location: Right Arm)   Pulse (!) 102   Temp 99 F (37.2 C) (Oral)   Resp 20   SpO2 98%  Physical Exam Vitals and nursing note reviewed.  Constitutional:      General: She is not in acute distress.    Appearance: She is well-developed. She is not toxic-appearing.  HENT:     Head: Normocephalic and atraumatic.  Eyes:     General:        Right eye: No discharge.        Left eye: No discharge.     Conjunctiva/sclera: Conjunctivae normal.  Cardiovascular:     Rate and Rhythm: Regular rhythm. Tachycardia present.     Comments: 2+ radial pulses bilaterally. Pulmonary:     Effort: No respiratory distress.     Breath sounds: Normal breath sounds. No wheezing or rales.  Chest:     Chest wall: No tenderness.  Abdominal:     General: There is no distension.     Palpations: Abdomen is soft.     Tenderness: There is no abdominal tenderness. There is no guarding or rebound.  Musculoskeletal:     Cervical back: Neck supple.     Comments: Upper/lower extremities: Actively ranging at major joints.  No focal  bony tenderness.  Compartments are soft.  Skin:    General: Skin is warm and dry.  Neurological:     Mental Status: She is alert.     Comments: Clear speech.   Psychiatric:        Behavior: Behavior normal.     ED Results / Procedures / Treatments   Labs (all labs ordered are listed, but only abnormal results are displayed) Labs Reviewed  CBC WITH DIFFERENTIAL/PLATELET - Abnormal; Notable for the following components:      Result Value   WBC 33.9 (*)    RBC 2.90 (*)    Hemoglobin 8.2 (*)    HCT 23.9 (*)    RDW 17.9 (*)    Platelets 101 (*)    All other components within normal limits  COMPREHENSIVE METABOLIC  PANEL - Abnormal; Notable for the following components:   Sodium 131 (*)    Potassium 5.4 (*)    CO2 17 (*)    Glucose, Bld 454 (*)    BUN 70 (*)    Creatinine, Ser 3.55 (*)    Calcium 10.6 (*)    Total Protein 6.3 (*)    Total Bilirubin 1.6 (*)    GFR, Estimated 13 (*)    All other components within normal limits  CK - Abnormal; Notable for the following components:   Total CK 33 (*)    All other components within normal limits  CBG MONITORING, ED - Abnormal; Notable for the following components:   Glucose-Capillary 443 (*)    All other components within normal limits  LIPASE, BLOOD  URINALYSIS, ROUTINE W REFLEX MICROSCOPIC    EKG EKG Interpretation  Date/Time:  Friday December 29 2021 12:22:27 EDT Ventricular Rate:  86 PR Interval:  149 QRS Duration: 99 QT Interval:  377 QTC Calculation: 451 R Axis:   -35 Text Interpretation: Sinus rhythm Abnormal R-wave progression, early transition Left ventricular hypertrophy No significant change was found Confirmed by Ezequiel Essex [phone]) on 01/11/2022 12:37:11 PM  Radiology US Renal Transplant w/Doppler  Result Date: 12/28/2021 CLINICAL DATA:  Renal transplant EXAM: ULTRASOUND OF RENAL TRANSPLANT WITH RENAL DOPPLER ULTRASOUND TECHNIQUE: Ultrasound examination of the renal transplant was performed with gray-scale,  color and duplex doppler evaluation. COMPARISON:  CT 12/23/2021 FINDINGS: Transplant kidney location: RIGHT iliac fossa Transplant Kidney: Renal measurements: 12.7 x 6.4 x 7.2 cm = volume: 325m. Normal in size and parenchymal echogenicity. No evidence of mass or hydronephrosis. No peri-transplant fluid collection seen. Color flow in the main renal artery:  Present Color flow in the main renal vein:  Present Duplex Doppler Evaluation: Main Renal Artery Resistive Index: 0.88 Venous waveform in main renal vein:  Present Intrarenal resistive index in upper pole:  0.64 (normal 0.6-0.8; equivocal 0.8-0.9; abnormal >= 0.9) Intrarenal resistive index in lower pole: 0.68 (normal 0.6-0.8; equivocal 0.8-0.9; abnormal >= 0.9) Bladder: Normal for degree of bladder distention. Other findings:  Ureteral jets not identified. IMPRESSION: 1. No hydronephrosis. 2. Main renal artery resistive index equivocal range. 3. Intrarenal resistive index is within normal limits. 4. Arterial and venous color flow at the hila. Electronically Signed   By: SSuzy BouchardM.D.   On: 01/09/2022 14:55   DG Chest 2 View  Result Date: 01/07/2022 CLINICAL DATA:  Leukocytosis in a 76year old female. EXAM: CHEST - 2 VIEW COMPARISON:  March 04, 2020. FINDINGS: EKG leads project over the chest. Cardiomediastinal contours and hilar structures are stable with mild cardiac enlargement accentuated by AP technique. No lobar consolidation.  No gross effusion.  No pneumothorax. On limited assessment there is no acute skeletal finding. IMPRESSION: Mild cardiac enlargement without acute cardiopulmonary process. Electronically Signed   By: GZetta BillsM.D.   On: 01/03/2022 14:19   CT ABDOMEN PELVIS WO CONTRAST  Result Date: 01/02/2022 CLINICAL DATA:  Generalized abdominal pain for couple of weeks. Acute kidney failure. EXAM: CT ABDOMEN AND PELVIS WITHOUT CONTRAST TECHNIQUE: Multidetector CT imaging of the abdomen and pelvis was performed following the  standard protocol without IV contrast. RADIATION DOSE REDUCTION: This exam was performed according to the departmental dose-optimization program which includes automated exposure control, adjustment of the mA and/or kV according to patient size and/or use of iterative reconstruction technique. COMPARISON:  MRI abdomen February 24, 2014 FINDINGS: Lower chest: No acute abnormality. Hepatobiliary: Unremarkable noncontrast appearance of the  liver. Cholelithiasis without findings of acute cholecystitis. No biliary ductal dilation. Pancreas: No pancreatic ductal dilation or evidence of acute inflammation. Spleen: No splenomegaly or focal splenic lesion. Adrenals/Urinary Tract: Bilateral adrenal glands are within normal limits. Calcifications in bilateral atrophic kidneys. Right lower quadrant transplant kidney without hydronephrosis or nephrolithiasis. Urinary bladder is unremarkable for degree of distension. Stomach/Bowel: No radiopaque enteric contrast material was administered. Stomach is nondistended limiting evaluation. No pathologic dilation of small or large bowel. The appendix and terminal ileum appear normal. Scattered colonic diverticulosis without findings of acute diverticulitis. No evidence of acute bowel inflammation. 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 Infrarenal IVC filter. No pathologically enlarged abdominal or pelvic lymph nodes. Reproductive: Uterus and bilateral adnexa are unremarkable. Other: No significant abdominopelvic free fluid. Portions of the anterior abdominal wall are excluded by combination due to patient habitus limiting evaluation. Musculoskeletal: Multilevel degenerative changes spine. No acute osseous abnormality. IMPRESSION: 1. No hydronephrosis or evidence of acute bowel inflammation. 2. Prior aorto bi-iliac stent graft repair with embolization  coils in the excluded aneurysmal sac which has increased in size now measuring 7 cm, persistent endovascular leak can not be excluded on this examination. 3. 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 4. Cholelithiasis without findings of acute cholecystitis. 5.  Aortic Atherosclerosis (ICD10-I70.0). Electronically Signed   By: Dahlia Bailiff M.D.   On: 01/14/2022 13:54    Procedures .Critical Care  Performed by: Amaryllis Dyke, PA-C Authorized by: Amaryllis Dyke, PA-C     CRITICAL CARE Performed by: Kennith Maes   Total critical care time: 35 minutes  Critical care time was exclusive of separately billable procedures and treating other patients.  Critical care was necessary to treat or prevent imminent or life-threatening deterioration.  Critical care was time spent personally by me on the following activities: development of treatment plan with patient and/or surrogate as well as nursing, discussions with consultants, evaluation of patient's response to treatment, examination of patient, obtaining history from patient or surrogate, ordering and performing treatments and interventions, ordering and review of laboratory studies, ordering and review of radiographic studies, pulse oximetry and re-evaluation of patient's condition.  {Document cardiac monitor, telemetry assessment procedure when appropriate:1}  Medications Ordered in ED Medications - No data to display  ED Course/ Medical Decision Making/ A&P                           Medical Decision Making Amount and/or Complexity of Data Reviewed Labs: ordered. Radiology: ordered. ECG/medicine tests: ordered.  Risk Prescription drug management. Decision regarding hospitalization.   Patient presents to the ED with complaints of body aches and fatigue, this involves an extensive number of treatment options, and is a complaint that carries with it a high risk of  complications and morbidity. Nontoxic, vitals oral temp of 99, heart rate 102..   Ddx including but not limited to: Rhabdomyolysis, electrolyte derangement, renal failure, liver failure, infectious process, deconditioning  Additional history obtained:  Chart/nursing notes reviewed. Discussion w/ patient's daughter.  External records viewed including: Last creatinine 1.61.  EKG: Sinus rhythm Abnormal R-wave progression, early transition Left ventricular hypertrophy No significant change was found   Lab Tests:  I viewed & interpreted labs including:  CBC: Significant leukocytosis at almost 34,000 with left shift.  Patient is anemic worsening from prior CMP: Acute  on chronic renal failure with BUN 70 and creatinine 3.55.  Patient also hyperkalemic, hypercalcemic, and hyperglycemic with low bicarb, normal anion gap. Lipase: Within normal limits CK: No significant elevation.  Concern for sepsis given patient's leukocytosis and borderline vital signs, lactic acid, blood cultures, and antibiotics initiated w/ code.  Patient is hyperglycemic with a bicarb of 17, gap is normal, will check VBG.  Additionally concern for acute on chronic renal failure with electrolyte disturbance.  1 L normal saline ordered for hydration.  Will discuss with nephrology.  Imaging Studies:  I ordered and viewed the following imaging, agree with radiologist impression:  CT A/P wo: 1. No hydronephrosis or evidence of acute bowel inflammation. 2. Prior aorto bi-iliac stent graft repair with embolization coils in the excluded aneurysmal sac which has increased in size now measuring 7 cm, persistent endovascular leak can not be excluded on this examination. 3. 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 4. Cholelithiasis without findings of acute cholecystitis. 5.  Aortic Atherosclerosis  CXR; Mild cardiac enlargement without acute cardiopulmonary process.  Attending Dr. Wyvonnia Dusky  discussed with nephrology, will see patient in consultation.  Discussed with internal medicine residency service, accept admission.  Portions of this note were generated with [employer]. Dictation errors may occur despite best attempts at proofreading.   Final Clinical Impression(s) / ED Diagnoses Final diagnoses:  Acute renal failure superimposed on chronic kidney disease, unspecified CKD stage, unspecified acute renal failure type ([place])  Renal transplant recipient  SIRS (systemic inflammatory response syndrome) ([place])    Rx / DC Orders ED Discharge Orders     None

## 2021-12-29 NOTE — ED Notes (Signed)
Patient transported to X-ray 

## 2021-12-29 NOTE — ED Notes (Signed)
Patient transported to CT 

## 2021-12-29 NOTE — Hospital Course (Signed)
Admitted 01/22/2022  Allergies: Hectorol [doxercalciferol], Latex, Other, Vitamin d analogs, Coumadin [warfarin sodium], and Lisinopril Pertinent Hx: T2DM with autonomic neuropathy, renal transplant in 2010, abdominal aortic aneurysm s/p iliac stent, HTN, hypothyroidism, osteoarthritis, HLD and CKD 3B  76 y.o. female p/w generalized muscle aches/malaise/poor appetite  *Sepsis rule out: Met SIRS criteria with WC 33.9, elevated RR, HR and low grade temp in the setting of immunocompromised state. Started on broad-spectrium abx, Vanc and cefepime. CXR , CT A/P neg. Pending UA, Bcx and Ucx.   *AKI with CKD: Increasing creatinine from 1.59-3.55.  Likely secondary to volume depletion.  Nephrology on board.  Repleting with IV LR  *Severe hyperglycemia: Uncontrolled due to DM with BS and 300s to 400s.  Mild AGMA.  Gave NovoLog 10 units x1, started on Semglee 20 units.  Pending UA  Consults: Nephro  Meds:  VTE ppx: heparin IVF: IVLR Diet: CM                       [name] is a 76 y.o. with a PMH of T2DM with autonomic neuropathy, renal transplant in 2010, abdominal aortic aneurysm s/p iliac stent, HTN, hypothyroidism, osteoarthritis, HLD and CKD 3 who presented with generalized muscle aches, progressive malaise and poor appetite and found to meet SIRS criteria and in acute on chronic renal failure.

## 2021-12-29 NOTE — ED Triage Notes (Signed)
Pt arrived POV from home c/o generalized pain all over and not being able to get around well. Pt states this has been going on for a couple weeks.

## 2021-12-29 NOTE — Progress Notes (Signed)
Elink following code sepsis bundle. 

## 2021-12-29 NOTE — ED Provider Triage Note (Signed)
Emergency Medicine Provider Triage Evaluation Note  [name] , a 76 y.o. female  was evaluated in triage.  Pt complains of fatigue. Report feeling achy throughout the body, decreased appetite, having headache and overall not feeling well. Having nausea and vomiting on occasion.  Sxs ongoing x 2 weeks.  Was no cholesterol medication recently but stopped taking it a few weeks ago.  Hx of DM, didn't take her medication yesterday  Review of Systems  Positive: As above Negative: As above  Physical Exam  BP (!) 131/49 (BP Location: Right Arm)   Pulse (!) 102   Temp 99 F (37.2 C) (Oral)   Resp 20   SpO2 98%  Gen:   Awake, no distress   Resp:  Normal effort  MSK:   Moves extremities without difficulty  Other:    Medical Decision Making  Medically screening exam initiated at 10:07 AM.  Appropriate orders placed.  IREAN KENDRICKS was informed that the remainder of the evaluation will be completed by another provider, this initial triage assessment does not replace that evaluation, and the importance of remaining in the ED until their evaluation is complete.     Domenic Moras, PA-C 12/24/2021 1020

## 2021-12-29 NOTE — Consult Note (Signed)
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  She had delayed graft function requiring 5 dialysis treatments posttransplant and had complications following protocol biopsy when she developed perinephric hematoma requiring evacuation/transfusions.  She follows up with nephrology at [facility] and has a baseline creatinine of 1.6-1.7.  She presented to the emergency room with a rather convoluted history but centrally generalized weakness and pain for the last few days superimposed on 2 weeks of progressively decreasing energy levels.  This was apparently triggered by an episode of constipation for which he took laxatives and developed nausea, vomiting and diarrhea with may be an episode of hematochezia.  She denies any fevers or chills and does not have any cough, sputum production or wheezing.  She denies any dysuria, urgency, frequency, allograft site pain or hematuria.  She reports that she has not missed any of her immunosuppressive medications but feels that her appetite/fluid intake has not been as vibrant as it should be.  She denies any orthostatic dizziness, orthopnea, exertional dyspnea or pedal edema.  Her immunosuppression regimen: Tacrolimus 6 mg twice daily (increased from 5 mg twice daily on 09/19/2021), CellCept 500 mg twice daily and prednisone 5 mg daily.  Past Medical History:  Diagnosis Date   AAA (abdominal aortic aneurysm) ([place])    3.8 cm Infrarenal managed at [place] disorder [facility])    PE, IVC filter placed at [facility]   Diabetes mellitus    Hypertension    Hypothyroidism    Morbid obesity ([place])    BMI 34   Renal transplant recipient    [facility]    Past Surgical History:  Procedure Laterality Date   ABDOMINAL AORTIC ANEURYSM REPAIR     KIDNEY TRANSPLANT      History reviewed. No pertinent family history.  Social History:  reports that she quit smoking about 12 years ago. Her smoking use included cigarettes. She has never used smokeless tobacco. She reports that she does not currently use alcohol. She reports that she does not use drugs.  Allergies:  Allergies  Allergen Reactions   Hectorol [Doxercalciferol] Anaphylaxis and Shortness Of Breath   Latex Anaphylaxis and Other (See Comments)    discharge   Other Rash    Thin toilet tissue and medical paper table covering causes rash.   Vitamin D Analogs Anaphylaxis and Shortness Of Breath    Could not catch breath and had bad reaction   Coumadin [Warfarin Sodium] Other (See Comments)    Hemorhage. Has IVC filter   Lisinopril Cough    Medications: I have reviewed the patient's current medications. Prior to Admission: (Not in a hospital admission)  Scheduled:  vancomycin variable dose per unstable renal function (pharmacist dosing)   Does not apply See admin instructions   Continuous:  ceFEPime (MAXIPIME) IV     lactated ringers     metronidazole     sodium chloride     vancomycin        Latest Ref Rng & Units 01/14/2022   10:39 AM 03/29/2020    9:29 AM 03/06/2020    9:11 AM  BMP  Glucose 70 - 99 mg/dL 454  89  157   BUN 8 - 23 mg/dL 70  27  18  Creatinine 0.44 - 1.00 mg/dL 3.55  1.59  1.47   BUN/Creat Ratio 12 - 28  17    Sodium 135 - 145 mmol/L 131  140  131   Potassium 3.5 - 5.1 mmol/L 5.4  4.2  4.5   Chloride 98 - 111 mmol/L 100  102  97   CO2 22 - 32 mmol/L '17  22  23   '$ Calcium 8.9 - 10.3 mg/dL 10.6  9.4  8.9       Latest Ref Rng & Units 12/28/2021   10:39 AM 03/06/2020    9:11 AM 03/05/2020    4:51 AM  CBC  WBC 4.0 - 10.5 K/uL 33.9  8.4  6.7   Hemoglobin 12.0 - 15.0 g/dL 8.2  11.2  10.9   Hematocrit 36.0 - 46.0 % 23.9  36.3  35.3   Platelets 150  - 400 K/uL 101  204  202      No results found.  Review of Systems  Constitutional:  Positive for activity change and fatigue. Negative for chills and fever.  HENT:  Negative for nosebleeds, sinus pressure, sinus pain and sore throat.   Eyes:  Negative for photophobia and visual disturbance.  Respiratory:  Negative for cough, chest tightness and shortness of breath.   Cardiovascular:  Negative for chest pain and leg swelling.  Gastrointestinal:  Positive for abdominal pain, constipation, diarrhea, nausea and vomiting.  Endocrine: Negative for polydipsia and polyuria.  Genitourinary:  Negative for difficulty urinating, dysuria, flank pain and hematuria.  Musculoskeletal:  Positive for back pain and myalgias. Negative for arthralgias.  Skin:  Negative for pallor and wound.  Neurological:  Positive for weakness. Negative for dizziness, light-headedness and headaches.  Psychiatric/Behavioral:  The patient is nervous/anxious.    Blood pressure (!) 130/55, pulse 87, temperature 98.8 F (37.1 C), resp. rate 19, height '5\' 7"'$  (1.702 m), weight 100.3 kg, SpO2 100 %. Physical Exam Vitals reviewed.  Constitutional:      Appearance: She is obese. She is ill-appearing.  HENT:     Head: Normocephalic and atraumatic.     Right Ear: External ear normal.     Left Ear: External ear normal.     Nose: Nose normal.     Mouth/Throat:     Mouth: Mucous membranes are dry.     Pharynx: Oropharynx is clear.  Eyes:     General: No scleral icterus.    Extraocular Movements: Extraocular movements intact.     Conjunctiva/sclera: Conjunctivae normal.  Cardiovascular:     Rate and Rhythm: Normal rate and regular rhythm.     Pulses: Normal pulses.     Heart sounds: Normal heart sounds.  Pulmonary:     Effort: Pulmonary effort is normal.     Breath sounds: Normal breath sounds. No wheezing or rales.  Abdominal:     General: Bowel sounds are normal. There is distension.     Palpations: Abdomen is soft.      Tenderness: There is no abdominal tenderness. There is no guarding.  Musculoskeletal:     Cervical back: Normal range of motion and neck supple. No tenderness.     Right lower leg: No edema.     Left lower leg: No edema.     Comments: Thrombosed left forearm AV graft  Skin:    General: Skin is warm and dry.  Neurological:     Mental Status: She is alert and oriented to person, place, and time.     Assessment/Plan: 1.  Acute kidney injury on chronic kidney disease stage III T: Based on the history and physical exam, appears to be largely hemodynamically mediated acute kidney injury especially with concomitant hyperglycemia and significantly elevated BUN: Creatinine ratio.  I will check a urinalysis and urine electrolytes along with renal transplant ultrasound and order for intravenous fluids for volume expansion.  I will check a Prograf level tomorrow morning and resume her immunosuppressive medications.  She has mild hypercalcemia, hyponatremia and hyperkalemia which I suspect is largely associated with her hyperglycemia and osmotic diuresis/volume contraction which I am going to correct with volume expansion. Avoid nephrotoxic medications including NSAIDs and iodinated intravenous contrast exposure unless the latter is absolutely indicated.  Preferred narcotic agents for pain control are hydromorphone, fentanyl, and methadone. Morphine should not be used. Avoid Baclofen and avoid oral sodium phosphate and magnesium citrate based laxatives / bowel preps. Continue strict Input and Output monitoring. Will monitor the patient closely with you and intervene or adjust therapy as indicated by changes in clinical status/labs. 2.  Anion gap metabolic acidosis: Secondary to acute kidney injury but with concomitant hyperglycemia, will need to maintain close vigilance for diabetic ketoacidosis.  Volume expansion and resumption of insulin at this time. 3.  Hyponatremia: This appears to be largely  pseudohyponatremia in the setting of hyperglycemia.  Monitor with insulin/volume expansion. 4.  Hyperkalemia: Secondary to acute kidney injury/transcellular shifts and hyperglycemia.  Monitor with volume expansion and insulin. 5.  Generalized weakness/pain: Undertake sepsis work-up especially to rule out urinary tract infection/intra-abdominal focus of infection in this patient on immunosuppression while continuing broad-spectrum antimicrobial therapy until focus identified. 6.  Hypertension: Hold furosemide at this time while undertaking volume expansion, resume amlodipine. 7.  Insulin-dependent type 2 diabetes mellitus: Resume insulin at this time and diet per primary service.  Tyronn Golda K. 01/16/2022, 1:32 PM

## 2021-12-29 NOTE — Progress Notes (Signed)
Pharmacy Antibiotic Note  [name] is a 76 y.o. female admitted on 01/09/2022 with sepsis. PMH significant for renal transplant in 2010 on immunosuppressants, hx of DVT, abdominal aortic aneurysm, DM, and HTN. Scr elevated 3.55 (last 1.59 about 1 year ago). WBC 33.9 and afebrile. Pharmacy has been consulted for cefepime and vancomycin dosing.  Plan: Vancomycin '2000mg'$  x 1 load Vancomycin dose per levels for unstable renal function   Cefepime 2g q24h Continue flagyl per MD  F/u renal function, clinical course and levels as indicated   Height: '5\' 7"'$  (170.2 cm) Weight: 100.3 kg (221 lb 1.9 oz) IBW/kg (Calculated) : 61.6  Temp (24hrs), Avg:98.9 F (37.2 C), Min:98.8 F (37.1 C), Max:99 F (37.2 C)  Recent Labs  Lab 01/17/2022 1039  WBC 33.9*  CREATININE 3.55*    Estimated Creatinine Clearance: 16.9 mL/min (A) (by C-G formula based on SCr of 3.55 mg/dL (H)).    Allergies  Allergen Reactions   Hectorol [Doxercalciferol] Anaphylaxis and Shortness Of Breath   Latex Anaphylaxis and Other (See Comments)    discharge   Other Rash    Thin toilet tissue and medical paper table covering causes rash.   Vitamin D Analogs Anaphylaxis and Shortness Of Breath    Could not catch breath and had bad reaction   Coumadin [Warfarin Sodium] Other (See Comments)    Hemorhage. Has IVC filter   Lisinopril Cough    Antimicrobials this admission: Vancomycin 7/7 > Flagyl 7/7 > Cefepime 7/7 >  Dose adjustments this admission:  Microbiology results: 7/7 BCx: sent 7/7 UCx: sent   Thank you for allowing pharmacy to be a part of this patient's care.  Levonne Spiller 01/02/2022 12:55 PM

## 2021-12-29 NOTE — Consult Note (Signed)
Hospital Consult    Reason for Consult: Interval increase in size of infrarenal abdominal aortic aneurysm Requesting Physician: ED MRN #:  [id number]  History of Present Illness: This is a 76 y.o. female who presented to the hospital with myalgias from the neck to the knees.  She has a history of renal transplant as well as infrarenal abdominal aortic aneurysm which was repaired in 2012 with subsequent IMA embolization in 2016, transcatheter embolization March 2020 for persistent type II endoleak.  Since that time, the aneurysm is continued to increase in size most recently measuring 6.9 cm in February of this year.   On exam today, [name] was smiling but noted pain from her neck down to her knees.  She came to [facility] as she lives in [place].  She has point tenderness in the right flank where stated her kidney was located.  No peritonitis, no abdominal pain otherwise.  Pain is positional resolving in the supine position, but noted with trying to get out of bed.  Denies claudication, ischemic rest pain, tissue loss.  Past Medical History:  Diagnosis Date   AAA (abdominal aortic aneurysm) ([place])    3.8 cm Infrarenal managed at [place] disorder [facility])    PE, IVC filter placed at [facility]   Diabetes mellitus    Hypertension    Hypothyroidism    Morbid obesity ([place])    BMI 34   Renal transplant recipient    [facility]    Past Surgical History:  Procedure Laterality Date   ABDOMINAL AORTIC ANEURYSM REPAIR     KIDNEY TRANSPLANT      Allergies  Allergen Reactions   Hectorol [Doxercalciferol] Anaphylaxis and Shortness Of Breath   Latex Anaphylaxis and Other (See Comments)    discharge   Other Rash    Thin toilet tissue and medical paper table covering causes rash.   Vitamin D Analogs Anaphylaxis and Shortness Of Breath    Could not catch breath and had bad reaction   Coumadin [Warfarin Sodium] Other (See Comments)    Hemorhage. Has IVC filter   Lisinopril Cough     Prior to Admission medications   Medication Sig Start Date End Date Taking? Authorizing Provider  acetaminophen (TYLENOL) 325 MG tablet Take 650 mg by mouth every 6 (six) hours as needed for moderate pain.   Yes [provider]  amLODipine (NORVASC) 10 MG tablet Take 1 tablet (10 mg total) by mouth at bedtime. 05/11/21  Yes Orvis Brill, MD  aspirin EC 81 MG tablet Take 81 mg by mouth daily. Swallow whole.   Yes [provider]  cholecalciferol (VITAMIN D3) 25 MCG (1000 UNIT) tablet Take 1,000 Units by mouth daily.   Yes [provider]  fluticasone (FLONASE) 50 MCG/ACT nasal spray SHAKE LIQUID AND USE 1 SPRAY IN EACH NOSTRIL DAILY Patient taking differently: Place 1 spray into both nostrils daily as needed for allergies. 07/13/21  Yes Axel Filler, MD  furosemide (LASIX) 40 MG tablet TAKE 1 TABLET(40 MG) BY MOUTH DAILY Patient taking differently: Take 40 mg by mouth daily. 05/11/21  Yes Orvis Brill, MD  hydrocortisone 2.5 % cream Apply to legs as needed daily Patient taking differently: Apply 1 Application topically daily as needed (itching). 09/13/21  Yes Axel Filler, MD  insulin glargine (LANTUS SOLOSTAR) 100 UNIT/ML Solostar Pen Inject 38 Units into the skin daily. 09/25/21  Yes Lucious Groves, DO  levothyroxine (SYNTHROID) 100 MCG tablet Take 1 tablet (100 mcg  total) by mouth daily. 05/11/21  Yes Orvis Brill, MD  Multiple Vitamin (MULTIVITAMIN WITH MINERALS) TABS tablet Take 1 tablet by mouth daily.   Yes [provider]  mycophenolate (CELLCEPT) 250 MG capsule Take 2 capsules (500 mg total) by mouth 2 (two) times daily. 05/20/14  Yes Burns, Alexa R, MD  pantoprazole (PROTONIX) 40 MG tablet Take 1 tablet (40 mg total) by mouth daily. 05/11/21  Yes Orvis Brill, MD  pentoxifylline (TRENTAL) 400 MG CR tablet Take 400 mg by mouth 2 (two) times daily. 03/13/16  Yes [provider]  predniSONE  (DELTASONE) 5 MG tablet Take 1 tablet (5 mg total) by mouth daily with breakfast. 05/11/21  Yes Orvis Brill, MD  sulfamethoxazole-trimethoprim (BACTRIM) 400-80 MG tablet Take 1 tablet by mouth every Monday, Wednesday, and Friday. 10/20/21  Yes Axel Filler, MD  tacrolimus (PROGRAF) 1 MG capsule Take 5 capsules (5 mg total) by mouth 2 (two) times daily. Takes 4.'5mg'$  total twice daily Patient taking differently: Take 6 mg by mouth 2 (two) times daily. 05/11/21  Yes Orvis Brill, MD  valACYclovir (VALTREX) 1000 MG tablet Take 1,000 mg by mouth 2 (two) times daily as needed (breakout). 12/11/21  Yes [provider]  Accu-Chek Softclix Lancets lancets Check blood sugar up to 3 times a day  as instructed 12/02/19   Axel Filler, MD  ezetimibe (ZETIA) 10 MG tablet Take 1 tablet (10 mg total) by mouth daily. Patient not taking: Reported on 01/01/2022 09/21/21 12/20/21  Sanjuan Dame, MD  glucose blood (ACCU-CHEK AVIVA) test strip Use to test blood sugars three times a day. Dx code:E11.9 12/02/19   Axel Filler, MD  guaiFENesin-dextromethorphan [facility] DM) 100-10 MG/5ML syrup Take 5 mLs by mouth every 4 (four) hours as needed for cough. Patient not taking: Reported on 12/28/2021 05/11/21   Orvis Brill, MD  Insulin Pen Needle (BD PEN NEEDLE NANO U/F) 32G X 4 MM MISC USE TO GIVE INSULIN 4 TIMES A DAY 07/05/21   Axel Filler, MD  polyethylene glycol (MIRALAX MIX-IN PAX) 17 g packet Take 17 g by mouth daily. Patient not taking: Reported on 12/31/2021 03/10/20   Marty Heck, DO    Social History   Socioeconomic History   Marital status: Divorced    Spouse name: Not on file   Number of children: Not on file   Years of education: 2 yrs coll   Highest education level: Not on file  Occupational History   Not on file  Tobacco Use   Smoking status: Former    Types: Cigarettes    Quit date: 03/02/2009    Years since quitting: 12.8    Smokeless tobacco: Never   Tobacco comments:    Quit x 70yr.  Substance and Sexual Activity   Alcohol use: Not Currently    Alcohol/week: 0.0 standard drinks of alcohol    Comment: Wine sometimes.   Drug use: No   Sexual activity: Not on file  Other Topics Concern   Not on file  Social History Narrative   Not on file   Social Determinants of Health   Financial Resource Strain: Not on file  Food Insecurity: Not on file  Transportation Needs: Not on file  Physical Activity: Not on file  Stress: Not on file  Social Connections: Not on file  Intimate Partner Violence: Not on file   History reviewed. No pertinent family history.  ROS: Otherwise negative unless mentioned in HPI  Physical  Examination  Vitals:   01/03/2022 1830 12/30/2021 1845  BP: (!) 134/57 (!) 137/57  Pulse: 89 92  Resp:  20  Temp: 99.1 F (37.3 C) 99.1 F (37.3 C)  SpO2: 97% 98%   Body mass index is 34.63 kg/m.  General:  WDWN in NAD Gait: Not observed HENT: WNL, normocephalic Pulmonary: normal non-labored breathing Cardiac: regular, Abdomen:  soft, NT/ND, no masses Skin: without rashes Vascular Exam/Pulses: warm, well-perfused Extremities: without ischemic changes, without Gangrene , without cellulitis; without open wounds;  Musculoskeletal: no muscle wasting or atrophy  Neurologic: A&O X 3;  No focal weakness or paresthesias are detected; speech is fluent/normal Psychiatric:  The pt has Normal affect. Lymph:  Unremarkable  CBC    Component Value Date/Time   WBC 33.9 (H) 01/11/2022 1039   RBC 2.90 (L) 12/23/2021 1039   HGB 10.2 (L) 01/01/2022 1537   HCT 30.0 (L) 01/12/2022 1537   PLT 101 (L) 12/28/2021 1039   MCV 82.4 01/12/2022 1039   MCV 94.6 05/08/2013 1135   MCH 28.3 01/01/2022 1039   MCHC 34.3 01/17/2022 1039   RDW 17.9 (H) 01/16/2022 1039   LYMPHSABS 20.0 (H) 01/15/2022 1039   MONOABS 2.4 (H) 01/18/2022 1039   EOSABS 0.0 01/11/2022 1039   BASOSABS 0.0 01/09/2022 1039     BMET    Component Value Date/Time   NA 134 (L) 01/11/2022 1537   NA 140 03/29/2020 0929   K 5.3 (H) 12/24/2021 1537   CL 100 01/14/2022 1039   CO2 17 (L) 12/27/2021 1039   GLUCOSE 454 (H) 01/14/2022 1039   BUN 70 (H) 12/27/2021 1039   BUN 27 03/29/2020 0929   CREATININE 3.55 (H) 01/09/2022 1039   CREATININE 1.53 (H) 09/22/2014 1519   CALCIUM 10.6 (H) 12/24/2021 1039   GFRNONAA 13 (L) 12/24/2021 1039   GFRNONAA 35 (L) 09/22/2014 1519   GFRAA 37 (L) 03/29/2020 0929   GFRAA 40 (L) 09/22/2014 1519    COAGS: Lab Results  Component Value Date   INR 1.3 (H) 12/28/2021   INR 1.0 11/29/2006     Non-Invasive Vascular Imaging:   Vascular/Lymphatic: Prior aorto bi-iliac stent graft repair with embolization coils in the excluded aneurysmal sac measuring 7 cm   ASSESSMENT/PLAN: This is a 76 y.o. female presenting with myalgias from her neck to her knees.  Pain is most appreciated with movement, resolves in the supine position.  No abdominal pain, some point tenderness in the right flank.  Noncontrasted CT was reviewed demonstrating no significant change in aortic aneurysm size from previous study and February of this year.  No concerning anatomic findings such as fat stranding.  Patient has follow-up scheduled next month with her vascular surgeon at [facility].  I asked that she keep that appointment.  No vascular surgery intervention at this time.  Please call if questions or concerns arise.   Cassandria Santee MD MS [facility] [phone] 12/31/2021  7:44 PM

## 2021-12-30 ENCOUNTER — Observation Stay (HOSPITAL_COMMUNITY): Payer: Medicare Other

## 2021-12-30 DIAGNOSIS — R6510 Systemic inflammatory response syndrome (SIRS) of non-infectious origin without acute organ dysfunction: Secondary | ICD-10-CM | POA: Diagnosis present

## 2021-12-30 DIAGNOSIS — I4892 Unspecified atrial flutter: Secondary | ICD-10-CM | POA: Diagnosis not present

## 2021-12-30 DIAGNOSIS — N184 Chronic kidney disease, stage 4 (severe): Secondary | ICD-10-CM | POA: Diagnosis present

## 2021-12-30 DIAGNOSIS — D696 Thrombocytopenia, unspecified: Secondary | ICD-10-CM | POA: Diagnosis present

## 2021-12-30 DIAGNOSIS — Y830 Surgical operation with transplant of whole organ as the cause of abnormal reaction of the patient, or of later complication, without mention of misadventure at the time of the procedure: Secondary | ICD-10-CM | POA: Diagnosis present

## 2021-12-30 DIAGNOSIS — I129 Hypertensive chronic kidney disease with stage 1 through stage 4 chronic kidney disease, or unspecified chronic kidney disease: Secondary | ICD-10-CM | POA: Diagnosis present

## 2021-12-30 DIAGNOSIS — T8619 Other complication of kidney transplant: Secondary | ICD-10-CM | POA: Diagnosis present

## 2021-12-30 DIAGNOSIS — D631 Anemia in chronic kidney disease: Secondary | ICD-10-CM | POA: Diagnosis present

## 2021-12-30 DIAGNOSIS — D84821 Immunodeficiency due to drugs: Secondary | ICD-10-CM | POA: Diagnosis present

## 2021-12-30 DIAGNOSIS — Z66 Do not resuscitate: Secondary | ICD-10-CM | POA: Diagnosis not present

## 2021-12-30 DIAGNOSIS — E8770 Fluid overload, unspecified: Secondary | ICD-10-CM | POA: Diagnosis not present

## 2021-12-30 DIAGNOSIS — C9010 Plasma cell leukemia not having achieved remission: Secondary | ICD-10-CM | POA: Diagnosis present

## 2021-12-30 DIAGNOSIS — E039 Hypothyroidism, unspecified: Secondary | ICD-10-CM | POA: Diagnosis present

## 2021-12-30 DIAGNOSIS — E869 Volume depletion, unspecified: Secondary | ICD-10-CM | POA: Diagnosis present

## 2021-12-30 DIAGNOSIS — G9341 Metabolic encephalopathy: Secondary | ICD-10-CM | POA: Diagnosis present

## 2021-12-30 DIAGNOSIS — E872 Acidosis, unspecified: Secondary | ICD-10-CM | POA: Diagnosis present

## 2021-12-30 DIAGNOSIS — E1143 Type 2 diabetes mellitus with diabetic autonomic (poly)neuropathy: Secondary | ICD-10-CM | POA: Diagnosis present

## 2021-12-30 DIAGNOSIS — N179 Acute kidney failure, unspecified: Secondary | ICD-10-CM | POA: Diagnosis present

## 2021-12-30 DIAGNOSIS — F419 Anxiety disorder, unspecified: Secondary | ICD-10-CM | POA: Diagnosis not present

## 2021-12-30 DIAGNOSIS — R0609 Other forms of dyspnea: Secondary | ICD-10-CM | POA: Diagnosis not present

## 2021-12-30 DIAGNOSIS — J9601 Acute respiratory failure with hypoxia: Secondary | ICD-10-CM | POA: Diagnosis not present

## 2021-12-30 DIAGNOSIS — Z515 Encounter for palliative care: Secondary | ICD-10-CM | POA: Diagnosis not present

## 2021-12-30 DIAGNOSIS — E871 Hypo-osmolality and hyponatremia: Secondary | ICD-10-CM | POA: Diagnosis present

## 2021-12-30 DIAGNOSIS — Z20822 Contact with and (suspected) exposure to covid-19: Secondary | ICD-10-CM | POA: Diagnosis present

## 2021-12-30 DIAGNOSIS — I4891 Unspecified atrial fibrillation: Secondary | ICD-10-CM | POA: Diagnosis not present

## 2021-12-30 LAB — RENAL FUNCTION PANEL
Albumin: 3.3 g/dL — ABNORMAL LOW (ref 3.5–5.0)
Albumin: 3.4 g/dL — ABNORMAL LOW (ref 3.5–5.0)
Anion gap: 10 (ref 5–15)
Anion gap: 15 (ref 5–15)
BUN: 58 mg/dL — ABNORMAL HIGH (ref 8–23)
BUN: 67 mg/dL — ABNORMAL HIGH (ref 8–23)
CO2: 18 mmol/L — ABNORMAL LOW (ref 22–32)
CO2: 19 mmol/L — ABNORMAL LOW (ref 22–32)
Calcium: 10.5 mg/dL — ABNORMAL HIGH (ref 8.9–10.3)
Calcium: 11 mg/dL — ABNORMAL HIGH (ref 8.9–10.3)
Chloride: 107 mmol/L (ref 98–111)
Chloride: 108 mmol/L (ref 98–111)
Creatinine, Ser: 2.68 mg/dL — ABNORMAL HIGH (ref 0.44–1.00)
Creatinine, Ser: 3 mg/dL — ABNORMAL HIGH (ref 0.44–1.00)
GFR, Estimated: 16 mL/min — ABNORMAL LOW (ref >60)
GFR, Estimated: 18 mL/min — ABNORMAL LOW (ref >60)
Glucose, Bld: 243 mg/dL — ABNORMAL HIGH (ref 70–99)
Glucose, Bld: 329 mg/dL — ABNORMAL HIGH (ref 70–99)
Phosphorus: 4.1 mg/dL (ref 2.5–4.6)
Phosphorus: 4.3 mg/dL (ref 2.5–4.6)
Potassium: 4.8 mmol/L (ref 3.5–5.1)
Potassium: 5 mmol/L (ref 3.5–5.1)
Sodium: 136 mmol/L (ref 135–145)
Sodium: 141 mmol/L (ref 135–145)

## 2021-12-30 LAB — CBC
HCT: 20.8 % — ABNORMAL LOW (ref 36.0–46.0)
Hemoglobin: 7.2 g/dL — ABNORMAL LOW (ref 12.0–15.0)
MCH: 28.1 pg (ref 26.0–34.0)
MCHC: 34.6 g/dL (ref 30.0–36.0)
MCV: 81.3 fL (ref 80.0–100.0)
Platelets: 91 10*3/uL — ABNORMAL LOW (ref 150–400)
RBC: 2.56 MIL/uL — ABNORMAL LOW (ref 3.87–5.11)
RDW: 18 % — ABNORMAL HIGH (ref 11.5–15.5)
WBC: 27.3 10*3/uL — ABNORMAL HIGH (ref 4.0–10.5)
nRBC: 0.3 % — ABNORMAL HIGH (ref 0.0–0.2)

## 2021-12-30 LAB — URINALYSIS, ROUTINE W REFLEX MICROSCOPIC
Bilirubin Urine: NEGATIVE
Glucose, UA: 50 mg/dL — AB
Hgb urine dipstick: NEGATIVE
Ketones, ur: NEGATIVE mg/dL
Nitrite: NEGATIVE
Protein, ur: 30 mg/dL — AB
Specific Gravity, Urine: 1.014 (ref 1.005–1.030)
pH: 5 (ref 5.0–8.0)

## 2021-12-30 LAB — RESPIRATORY PANEL BY PCR

## 2021-12-30 LAB — LDL CHOLESTEROL, DIRECT: Direct LDL: 31.1 mg/dL (ref 0–99)

## 2021-12-30 LAB — LACTATE DEHYDROGENASE: LDH: 615 U/L — ABNORMAL HIGH (ref 98–192)

## 2021-12-30 LAB — GLUCOSE, CAPILLARY
Glucose-Capillary: 162 mg/dL — ABNORMAL HIGH (ref 70–99)
Glucose-Capillary: 197 mg/dL — ABNORMAL HIGH (ref 70–99)
Glucose-Capillary: 230 mg/dL — ABNORMAL HIGH (ref 70–99)
Glucose-Capillary: 237 mg/dL — ABNORMAL HIGH (ref 70–99)
Glucose-Capillary: 242 mg/dL — ABNORMAL HIGH (ref 70–99)
Glucose-Capillary: 332 mg/dL — ABNORMAL HIGH (ref 70–99)

## 2021-12-30 LAB — T4, FREE: Free T4: 1.57 ng/dL — ABNORMAL HIGH (ref 0.61–1.12)

## 2021-12-30 LAB — CREATININE, URINE, RANDOM: Creatinine, Urine: 99.92 mg/dL

## 2021-12-30 LAB — SODIUM, URINE, RANDOM: Sodium, Ur: 50 mmol/L

## 2021-12-30 LAB — PATHOLOGIST SMEAR REVIEW

## 2021-12-30 MED ORDER — METHIMAZOLE 10 MG PO TABS: 10.0000 mg | ORAL_TABLET | Freq: Three times a day (TID) | ORAL | Status: DC

## 2021-12-30 MED ORDER — INSULIN ASPART 100 UNIT/ML IJ SOLN
0.0000 [IU] | Freq: Three times a day (TID) | INTRAMUSCULAR | Status: DC
  Administered 2021-12-31 (×2): 3 [IU] via SUBCUTANEOUS
  Administered 2021-12-31 – 2022-01-01 (×2): 5 [IU] via SUBCUTANEOUS
  Administered 2022-01-01: 2 [IU] via SUBCUTANEOUS
  Administered 2022-01-01 – 2022-01-02 (×4): 5 [IU] via SUBCUTANEOUS
  Administered 2022-01-03: 3 [IU] via SUBCUTANEOUS

## 2021-12-30 MED ORDER — MUSCLE RUB 10-15 % EX CREA
TOPICAL_CREAM | CUTANEOUS | Status: DC | PRN
Start: 2021-12-30 — End: 2022-01-04
  Filled 2021-12-30: qty 85

## 2021-12-30 MED ORDER — INSULIN ASPART 100 UNIT/ML IJ SOLN
0.0000 [IU] | Freq: Every day | INTRAMUSCULAR | Status: DC
  Administered 2021-12-30 – 2022-01-02 (×4): 2 [IU] via SUBCUTANEOUS

## 2021-12-30 MED ORDER — INSULIN GLARGINE-YFGN 100 UNIT/ML ~~LOC~~ SOLN
30.0000 [IU] | Freq: Every day | SUBCUTANEOUS | Status: DC
  Administered 2021-12-30: 30 [IU] via SUBCUTANEOUS
  Filled 2021-12-30 (×2): qty 0.3

## 2021-12-30 NOTE — Evaluation (Signed)
Physical Therapy Evaluation Patient Details Name: [name] MRN: [id number] DOB: 1945-09-24 Today's Date: 12/30/2021  History of Present Illness  Pt is 76 yo female who presents to [facility] on 01/07/2022 with multiple myalgias, poor appetite and decreased thirst x 1 week as well as an episode of N, V, D. In ED pt appeared to be in sepsis with no clear source.  PMH: DM2, renal transplant 2012, AAA, HTN, hypothyroidism  Clinical Impression  Pt admitted with above diagnosis. Pt received in bed and relays generalized joint pain. Pt lives in a 2 story townhome with bed/ bath upstairs. Son lives with her but is in and out. Daughter reports that pt has not been mobilizing much past few days. Pt needed mod A for initial standing, min A with subsequent stands. Pt ambulated 25' with need for RW for UE support (did not use PTA) as well as min A. Pt with 2/4 DOE and noted SOB with sats dropping to 88% on RA. Recovered to 99% with seated rest break x4 mins.   Pt currently with functional limitations due to the deficits listed below (see PT Problem List). Pt will benefit from skilled PT to increase their independence and safety with mobility to allow discharge to the venue listed below.          Recommendations for follow up therapy are one component of a multi-disciplinary discharge planning process, led by the attending physician.  Recommendations may be updated based on patient status, additional functional criteria and insurance authorization.  Follow Up Recommendations Home health PT      Assistance Recommended at Discharge Intermittent Supervision/Assistance  Patient can return home with the following  A little help with walking and/or transfers;A little help with bathing/dressing/bathroom;Assistance with cooking/housework;Direct supervision/assist for medications management;Direct supervision/assist for financial management;Assist for transportation;Help with stairs or ramp for entrance    Equipment  Recommendations Rolling walker (2 wheels)  Recommendations for Other Services  OT consult    Functional Status Assessment Patient has had a recent decline in their functional status and demonstrates the ability to make significant improvements in function in a reasonable and predictable amount of time.     Precautions / Restrictions Precautions Precautions: Fall Restrictions Weight Bearing Restrictions: No      Mobility  Bed Mobility Overal bed mobility: Needs Assistance Bed Mobility: Supine to Sit     Supine to sit: Min guard, HOB elevated     General bed mobility comments: min guard with HOB elevated and increased time needed    Transfers Overall transfer level: Needs assistance Equipment used: Rolling walker (2 wheels) Transfers: Sit to/from Stand Sit to Stand: Mod assist, Min assist           General transfer comment: vc's for hand placement. Mod A for power up first time, min A subsequent trials    Ambulation/Gait Ambulation/Gait assistance: Min assist Gait Distance (Feet): 25 Feet Assistive device: Rolling walker (2 wheels) Gait Pattern/deviations: Step-through pattern, Decreased stride length Gait velocity: decreased Gait velocity interpretation: <1.31 ft/sec, indicative of household ambulator   General Gait Details: pt very slow, increased wt on RW, needed some assist guiding RW around obstacles (but pt not familiar with use of AD per her report). Min A to steady with turning  Stairs            Wheelchair Mobility    Modified Rankin (Stroke Patients Only)       Balance Overall balance assessment: Needs assistance Sitting-balance support: No upper extremity supported, Feet  supported Sitting balance-Leahy Scale: Fair     Standing balance support: Bilateral upper extremity supported Standing balance-Leahy Scale: Poor Standing balance comment: unsteady without support                             Pertinent Vitals/Pain Pain  Assessment Pain Assessment: Faces Faces Pain Scale: Hurts even more Pain Location: generalized Pain Descriptors / Indicators: Aching, Sore Pain Intervention(s): Limited activity within patient's tolerance, Monitored during session    Home Living Family/patient expects to be discharged to:: Private residence Living Arrangements: Children Available Help at Discharge: Family;Available PRN/intermittently Type of Home: Other(Comment) (townhome) Home Access: Level entry     Alternate Level Stairs-Number of Steps: 12 Home Layout: Bed/bath upstairs;Two level Home Equipment: Cane - single point Additional Comments: pt's son lives with her, per her report he is bipolar and goes to [facility] but he checks on her and does the cooking    Prior Function Prior Level of Function : Needs assist             Mobility Comments: ambulated independently and could do stairs to her room but daughter relays that lately she hasn't been walking much at all ADLs Comments: independent with ADL's. Son helps with IADL's     Hand Dominance   Dominant Hand: Right    Extremity/Trunk Assessment   Upper Extremity Assessment Upper Extremity Assessment: Defer to OT evaluation    Lower Extremity Assessment Lower Extremity Assessment: Generalized weakness    Cervical / Trunk Assessment Cervical / Trunk Assessment: Kyphotic  Communication   Communication: No difficulties  Cognition Arousal/Alertness: Lethargic Behavior During Therapy: WFL for tasks assessed/performed Overall Cognitive Status: Impaired/Different from baseline Area of Impairment: Following commands                       Following Commands: Follows multi-step commands with increased time, Follows multi-step commands consistently       General Comments: pt delayed with processing and following commands but appropriate        General Comments General comments (skin integrity, edema, etc.): SPO2 dropped to 88% on RA with  ambulation and pt with 2/4 DOE. Returned to 99% on RA with seated rest. HR 83 bpm    Exercises     Assessment/Plan    PT Assessment Patient needs continued PT services  PT Problem List Decreased strength;Decreased activity tolerance;Decreased balance;Decreased mobility;Decreased cognition;Decreased knowledge of use of DME;Decreased knowledge of precautions;Pain;Cardiopulmonary status limiting activity       PT Treatment Interventions DME instruction;Gait training;Stair training;Functional mobility training;Therapeutic activities;Therapeutic exercise;Balance training;Patient/family education;Cognitive remediation    PT Goals (Current goals can be found in the Care Plan section)  Acute Rehab PT Goals Patient Stated Goal: return home PT Goal Formulation: With patient Time For Goal Achievement: 01/13/22 Potential to Achieve Goals: Good    Frequency Min 3X/week     Co-evaluation               AM-PAC PT "6 Clicks" Mobility  Outcome Measure Help needed turning from your back to your side while in a flat bed without using bedrails?: None Help needed moving from lying on your back to sitting on the side of a flat bed without using bedrails?: A Little Help needed moving to and from a bed to a chair (including a wheelchair)?: A Little Help needed standing up from a chair using your arms (e.g., wheelchair or bedside chair)?: A Little Help  needed to walk in hospital room?: A Little Help needed climbing 3-5 steps with a railing? : Total 6 Click Score: 17    End of Session Equipment Utilized During Treatment: Gait belt Activity Tolerance: Patient limited by fatigue Patient left: in chair;with call bell/phone within reach;with chair alarm set;with family/visitor present Nurse Communication: Mobility status PT Visit Diagnosis: Unsteadiness on feet (R26.81);Muscle weakness (generalized) (M62.81);Difficulty in walking, not elsewhere classified (R26.2);Pain Pain - part of body:  (joint  pain, generalized)    Time: 5790-3833 PT Time Calculation (min) (ACUTE ONLY): 31 min   Charges:   PT Evaluation $PT Eval Moderate Complexity: 1 Mod PT Treatments $Gait Training: 8-22 mins        Leighton Roach, PT  Acute Rehab Services Secure chat preferred Office [place] 12/30/2021, 12:18 PM

## 2021-12-30 NOTE — Progress Notes (Signed)
Patient ID: [name], female   DOB: Dec 23, 1945, 76 y.o.   MRN: [id number] [facility] Progress Note   Assessment/ Plan:   1.  Acute kidney injury on chronic kidney disease stage III T: Based on the history and physical exam, appears to be largely hemodynamically mediated acute kidney injury especially with concomitant hyperglycemia and significantly elevated BUN: Creatinine ratio.  Urinalysis without any indication of acute GN and urine electrolytes suggestive of intrarenal process (may be skewed by preceding furosemide use).  Given improvement of renal function overnight with intravenous fluids, will continue lactated Ringer's at the current rate with efforts at glycemic management per primary service.  No evidence of urinary tract infection or obstruction seen on labs/imaging.  Prograf level pending. 2.  Anion gap metabolic acidosis: Secondary to acute kidney injury, continue to monitor with ongoing intravenous fluids and glycemic management. 3.  Hypercalcemia: This appears to be largely from intravascular volume contraction and would leave her off of cholecalciferol at this time.  Discussed with Dr. Daryll Drown this morning and I agree with her concerns to evaluate for PTLD in the setting of predominant lymphocytosis.  We will send off EBV PCR and check LDH. 4.  Generalized weakness/pain: On broad-spectrum antimicrobial coverage with cefepime, metronidazole and vancomycin.  Cultures negative to date with no clear focus of infection noted. 5.  Hypertension: Blood pressure under decent control on amlodipine, continue intravenous fluids at this time.  She was seen yesterday with regards to an enlarging AAA and outpatient vascular surgery at [facility] endovascular. 6.  Insulin-dependent type 2 diabetes mellitus: Resume insulin at this time and diet per primary service.  Subjective:   Reports to be feeling somewhat better, denies any chest pain or shortness of breath   Objective:    BP 128/62 (BP Location: Right Arm)   Pulse 94   Temp 98.2 F (36.8 C) (Oral)   Resp 20   Ht '5\' 7"'$  (1.702 m)   Wt 100.3 kg   SpO2 97%   BMI 34.63 kg/m   Intake/Output Summary (Last 24 hours) at 12/30/2021 1125 Last data filed at 12/30/2021 0808 Gross per 24 hour  Intake 2234.93 ml  Output 600 ml  Net 1634.93 ml   Weight change:   Physical Exam: Gen: Sitting up comfortably on the bed edge of her bed, drinking water.  Daughter at bedside CVS: Pulse regular rhythm, normal rate, S1 and S2 normal Resp: Clear to auscultation bilaterally, no rales/rhonchi Abd: Soft, obese, mild right lower quadrant tenderness Ext: No lower extremity edema  Imaging: US Renal Transplant w/Doppler  Result Date: 12/25/2021 CLINICAL DATA:  Renal transplant EXAM: ULTRASOUND OF RENAL TRANSPLANT WITH RENAL DOPPLER ULTRASOUND TECHNIQUE: Ultrasound examination of the renal transplant was performed with gray-scale, color and duplex doppler evaluation. COMPARISON:  CT 01/03/2022 FINDINGS: Transplant kidney location: RIGHT iliac fossa Transplant Kidney: Renal measurements: 12.7 x 6.4 x 7.2 cm = volume: 353m. Normal in size and parenchymal echogenicity. No evidence of mass or hydronephrosis. No peri-transplant fluid collection seen. Color flow in the main renal artery:  Present Color flow in the main renal vein:  Present Duplex Doppler Evaluation: Main Renal Artery Resistive Index: 0.88 Venous waveform in main renal vein:  Present Intrarenal resistive index in upper pole:  0.64 (normal 0.6-0.8; equivocal 0.8-0.9; abnormal >= 0.9) Intrarenal resistive index in lower pole: 0.68 (normal 0.6-0.8; equivocal 0.8-0.9; abnormal >= 0.9) Bladder: Normal for degree of bladder distention. Other findings:  Ureteral jets not identified. IMPRESSION: 1. No hydronephrosis. 2.  Main renal artery resistive index equivocal range. 3. Intrarenal resistive index is within normal limits. 4. Arterial and venous color flow at the hila. Electronically  Signed   By: Suzy Bouchard M.D.   On: 01/10/2022 14:55   DG Chest 2 View  Result Date: 12/25/2021 CLINICAL DATA:  Leukocytosis in a 76 year old female. EXAM: CHEST - 2 VIEW COMPARISON:  March 04, 2020. FINDINGS: EKG leads project over the chest. Cardiomediastinal contours and hilar structures are stable with mild cardiac enlargement accentuated by AP technique. No lobar consolidation.  No gross effusion.  No pneumothorax. On limited assessment there is no acute skeletal finding. IMPRESSION: Mild cardiac enlargement without acute cardiopulmonary process. Electronically Signed   By: Zetta Bills M.D.   On: 01/22/2022 14:19   CT ABDOMEN PELVIS WO CONTRAST  Result Date: 01/05/2022 CLINICAL DATA:  Generalized abdominal pain for couple of weeks. Acute kidney failure. EXAM: CT ABDOMEN AND PELVIS WITHOUT CONTRAST TECHNIQUE: Multidetector CT imaging of the abdomen and pelvis was performed following the standard protocol without IV contrast. RADIATION DOSE REDUCTION: This exam was performed according to the departmental dose-optimization program which includes automated exposure control, adjustment of the mA and/or kV according to patient size and/or use of iterative reconstruction technique. COMPARISON:  MRI abdomen February 24, 2014 FINDINGS: Lower chest: No acute abnormality. Hepatobiliary: Unremarkable noncontrast appearance of the liver. Cholelithiasis without findings of acute cholecystitis. No biliary ductal dilation. Pancreas: No pancreatic ductal dilation or evidence of acute inflammation. Spleen: No splenomegaly or focal splenic lesion. Adrenals/Urinary Tract: Bilateral adrenal glands are within normal limits. Calcifications in bilateral atrophic kidneys. Right lower quadrant transplant kidney without hydronephrosis or nephrolithiasis. Urinary bladder is unremarkable for degree of distension. Stomach/Bowel: No radiopaque enteric contrast material was administered. Stomach is nondistended limiting  evaluation. No pathologic dilation of small or large bowel. The appendix and terminal ileum appear normal. Scattered colonic diverticulosis without findings of acute diverticulitis. No evidence of acute bowel inflammation. 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 Infrarenal IVC filter. No pathologically enlarged abdominal or pelvic lymph nodes. Reproductive: Uterus and bilateral adnexa are unremarkable. Other: No significant abdominopelvic free fluid. Portions of the anterior abdominal wall are excluded by combination due to patient habitus limiting evaluation. Musculoskeletal: Multilevel degenerative changes spine. No acute osseous abnormality. IMPRESSION: 1. No hydronephrosis or evidence of acute bowel inflammation. 2. Prior aorto bi-iliac stent graft repair with embolization coils in the excluded aneurysmal sac which has increased in size now measuring 7 cm, persistent endovascular leak can not be excluded on this examination. 3. 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 4. Cholelithiasis without findings of acute cholecystitis. 5.  Aortic Atherosclerosis (ICD10-I70.0). Electronically Signed   By: Dahlia Bailiff M.D.   On: 12/26/2021 13:54    Labs: BMET Recent Labs  Lab 01/16/2022 1039 01/16/2022 1537 01/03/2022 2052 01/11/2022 2346 12/30/21 0859  NA 131* 134* 138 136 141  K 5.4* 5.3* 5.1 5.0 4.8  CL 100  --  107 107 108  CO2 17*  --  18* 19* 18*  GLUCOSE 454*  --  306* 329* 243*  BUN 70*  --  70* 67* 58*  CREATININE 3.55*  --  3.16* 3.00* 2.68*  CALCIUM 10.6*  --  10.6* 10.5* 11.0*  PHOS  --   --  4.5 4.3 4.1   CBC Recent Labs  Lab 01/10/2022 1039 01/20/2022 1537  12/30/2021 2346  WBC 33.9*  --  27.3*  NEUTROABS 11.5*  --   --   HGB 8.2* 10.2* 7.2*  HCT 23.9* 30.0* 20.8*  MCV 82.4  --  81.3  PLT 101*  --  91*     Medications:     aspirin EC  81 mg Oral Daily   heparin injection (subcutaneous)  5,000 Units Subcutaneous Q8H   insulin aspart  0-15 Units Subcutaneous Q4H   insulin glargine-yfgn  20 Units Subcutaneous QHS   pantoprazole  40 mg Oral Daily   pentoxifylline  400 mg Oral BID   vancomycin variable dose per unstable renal function (pharmacist dosing)   Does not apply See admin instructions    Elmarie Shiley, MD 12/30/2021, 11:25 AM

## 2021-12-30 NOTE — Progress Notes (Signed)
Clinical update:  Reevaluated after abnormal thyroid studies. Patient is rest in bed. Reports diffuse myalgias. Had 1 episode of non bilious emesis earlier this evening. She does reports history of hypothyroidism and takes levothyroxine 100 mcg daily. She has not taken more of this than usual.   Physical exam Blood pressure (!) 131/36, pulse 92, temperature 98.5 F (36.9 C), temperature source Oral, resp. rate 20, height '5\' 7"'$  (1.702 m), weight 100.3 kg, SpO2 97 %. General: resting in bed in no acute distress Neck: No thyromegaly, mass or cervical lymphadenopathy  Cardiovascular: regular rate and rhythm, 3/5 systolic murmur Respiratory: normal work of breathing, clear to ascultation bilaterally, no crackles of wheezing Extremities: trace bilateral LE pitting edema, 2+ radial pulses bilaterally Neuro: Aox3, no focal deficits  A/P Patient present with diffuse myalgias with elevated temperature to 99 F, leukocytosis, and lactic acidosis initial concern for sepsis, however no obvious source of infection. TSH low at <0.01 and elevated T4 of 1.5. Her vitals are currently stable afebrile, normotensive, and without tachycardia. Mental status remains at baseline. No neck masses on thyroid nodule on exam. Abnormal TSH and free T4 likely in setting of excess thyroid hormone treatment with levothyroxine 100 mcg daily, less likely due to thyrotoxicosis. Will follow up T3. Hold home levothyroxine. Will hold off on starting antithyroid medications. Continue to monitor vitals closely.

## 2021-12-30 NOTE — Evaluation (Signed)
Occupational Therapy Evaluation Patient Details Name: [name] MRN: [id number] DOB: 1946-02-13 Today's Date: 12/30/2021   History of Present Illness Pt is 76 yo female who presents to [facility] on 01/10/2022 with multiple myalgias, poor appetite and decreased thirst x 1 week as well as an episode of N, V, D. In ED pt appeared to be in sepsis with no clear source.  PMH: DM2, renal transplant 2012, AAA, HTN, hypothyroidism   Clinical Impression   Patient admitted for the diagnosis above. PTA she lives with her son in a two story townhouse.  The son is off from school until August, and can provide the needed assist.  Currently she is needing up to [place] Guard/Min A for basic mobility and ADL performance.  Deficits are listed below, she is struggling a little with complex thought and problem solving, and may need closer to 24 hour assist as needed initially.  OT will follow in the acute setting, and HH OT can be considered depending on progress.       Recommendations for follow up therapy are one component of a multi-disciplinary discharge planning process, led by the attending physician.  Recommendations may be updated based on patient status, additional functional criteria and insurance authorization.   Follow Up Recommendations  Follow physician's recommendations for discharge plan and follow up therapies    Assistance Recommended at Discharge Intermittent Supervision/Assistance  Patient can return home with the following Assist for transportation;Assistance with cooking/housework;Direct supervision/assist for medications management    Functional Status Assessment  Patient has had a recent decline in their functional status and demonstrates the ability to make significant improvements in function in a reasonable and predictable amount of time.  Equipment Recommendations  Tub/shower seat    Recommendations for Other Services       Precautions / Restrictions Precautions Precautions:  Fall Restrictions Weight Bearing Restrictions: No      Mobility Bed Mobility Overal bed mobility: Needs Assistance Bed Mobility: Supine to Sit, Sit to Supine     Supine to sit: Supervision Sit to supine: Min assist        Transfers Overall transfer level: Needs assistance Equipment used: None Transfers: Sit to/from Stand Sit to Stand: Min guard, Min assist                  Balance Overall balance assessment: Needs assistance Sitting-balance support: Feet supported Sitting balance-Leahy Scale: Good     Standing balance support: No upper extremity supported Standing balance-Leahy Scale: Fair Standing balance comment: able to walk to the toilet and sink reaching for objuects in her environment.                           ADL either performed or assessed with clinical judgement   ADL Overall ADL's : Needs assistance/impaired     Grooming: Wash/dry hands;Standing;Min guard           Upper Body Dressing : Min guard;Sitting;Standing   Lower Body Dressing: Min guard;Sit to/from stand   Toilet Transfer: Loss adjuster, chartered Details (indicate cue type and reason): no use of RW Toileting- Clothing Manipulation and Hygiene: Supervision/safety;Sit to/from stand               Vision Patient Visual Report: No change from baseline       Perception Perception Perception: Within Functional Limits   Praxis Praxis Praxis: Intact    Pertinent Vitals/Pain Pain Assessment Pain Assessment: Faces Faces Pain Scale: Hurts  a little bit Pain Location: generalized Pain Descriptors / Indicators: Aching, Sore Pain Intervention(s): Monitored during session     Hand Dominance Right   Extremity/Trunk Assessment Upper Extremity Assessment Upper Extremity Assessment: Generalized weakness   Lower Extremity Assessment Lower Extremity Assessment: Defer to PT evaluation   Cervical / Trunk Assessment Cervical / Trunk Assessment:  Kyphotic   Communication Communication Communication: No difficulties   Cognition Arousal/Alertness: Awake/alert Behavior During Therapy: WFL for tasks assessed/performed Overall Cognitive Status: Impaired/Different from baseline Area of Impairment: Awareness, Problem solving, Memory, Orientation                 Orientation Level: Person, Place, Situation   Memory: Decreased short-term memory Following Commands: Follows multi-step commands with increased time   Awareness: Emergent Problem Solving: Slow processing, Requires verbal cues       General Comments   HR to 110 with mobility.    Exercises     Shoulder Instructions      Home Living Family/patient expects to be discharged to:: Private residence Living Arrangements: Children Available Help at Discharge: Family;Available PRN/intermittently Type of Home: Other(Comment) Home Access: Level entry     Home Layout: Bed/bath upstairs;Two level Alternate Level Stairs-Number of Steps: 12 Alternate Level Stairs-Rails: Left Bathroom Shower/Tub: Teacher, early years/pre: Standard     Home Equipment: Cane - single point   Additional Comments: pt's son lives with her, per her report he is bipolar and goes to [facility] but he checks on her and does the cooking      Prior Functioning/Environment               Mobility Comments: ambulated independently and could do stairs to her room but daughter relays that lately she hasn't been walking much at all ADLs Comments: independent with ADL's. Son helps with IADL's        OT Problem List: Decreased activity tolerance;Impaired balance (sitting and/or standing)      OT Treatment/Interventions: Self-care/ADL training;Therapeutic activities;Patient/family education;Balance training    OT Goals(Current goals can be found in the care plan section) Acute Rehab OT Goals Patient Stated Goal: Hopefully return home soon OT Goal Formulation: With patient Time For  Goal Achievement: 01/12/22 Potential to Achieve Goals: Good ADL Goals Pt Will Perform Grooming: with modified independence;standing Pt Will Perform Lower Body Dressing: with modified independence;sit to/from stand Pt Will Transfer to Toilet: with modified independence;ambulating;regular height toilet  OT Frequency: Min 2X/week    Co-evaluation              AM-PAC OT "6 Clicks" Daily Activity     Outcome Measure Help from another person eating meals?: None Help from another person taking care of personal grooming?: A Little Help from another person toileting, which includes using toliet, bedpan, or urinal?: A Little Help from another person bathing (including washing, rinsing, drying)?: A Little Help from another person to put on and taking off regular upper body clothing?: A Little Help from another person to put on and taking off regular lower body clothing?: A Little 6 Click Score: 19   End of Session Nurse Communication: Mobility status  Activity Tolerance: Patient tolerated treatment well Patient left: in bed;with call bell/phone within reach;with family/visitor present  OT Visit Diagnosis: Unsteadiness on feet (R26.81)                Time: 7902-4097 OT Time Calculation (min): 21 min Charges:  OT General Charges $OT Visit: 1 Visit OT Evaluation $OT Eval Moderate Complexity:  1 Mod  12/30/2021  RP, OTR/L  Acute Rehabilitation Services  Office:  [place] 12/30/2021, 4:36 PM

## 2021-12-30 NOTE — Progress Notes (Addendum)
.  Subjective:   Hospital day: 1  Overnight event: No acute events overnight  Interim History: Patient was evaluated with daughter at bedside. Patient reports she continues to feel fatigue and did not sleep well last night. Patient thinks she has been doing fine at home however daughter reports that patient is likely not taking all her medications as prescribed due to problems with her vision. Daughter also reports that she noticed patient's oxygen level dropped today to the mid 80s while working with PT but rebound to the 90s after she sat down.  Patient denies any fevers or chills overnight.  Objective:  Vital signs in last 24 hours: Vitals:   12/26/2021 1815 12/23/2021 1830 01/01/2022 1845 12/25/2021 2110  BP: (!) 114/102 (!) 134/57 (!) 137/57 (!) 131/36  Pulse: 91 89 92   Resp: (!) 25  20   Temp: 99.1 F (37.3 C) 99.1 F (37.3 C) 99.1 F (37.3 C) 98.5 F (36.9 C)  TempSrc:    Oral  SpO2: 100% 97% 98% 97%  Weight:      Height:        Filed Weights   01/13/2022 1232  Weight: 100.3 kg     Intake/Output Summary (Last 24 hours) at 12/30/2021 0534 Last data filed at 12/30/2021 0108 Gross per 24 hour  Intake 1994.93 ml  Output --  Net 1994.93 ml   Net IO Since Admission: 1,994.93 mL [12/30/21 0534]  Recent Labs    12/30/2021 2210 12/30/21 0025 12/30/21 0426  GLUCAP 292* 332* 162*     Pertinent Labs:    Latest Ref Rng & Units 12/30/2021   11:46 PM 01/08/2022    3:37 PM 12/24/2021   10:39 AM  CBC  WBC 4.0 - 10.5 K/uL 27.3   33.9   Hemoglobin 12.0 - 15.0 g/dL 7.2  10.2  8.2   Hematocrit 36.0 - 46.0 % 20.8  30.0  23.9   Platelets 150 - 400 K/uL 91   101        Latest Ref Rng & Units 01/16/2022   11:46 PM 01/08/2022    8:52 PM 12/26/2021    3:37 PM  CMP  Glucose 70 - 99 mg/dL 329  306    BUN 8 - 23 mg/dL 67  70    Creatinine 0.44 - 1.00 mg/dL 3.00  3.16    Sodium 135 - 145 mmol/L 136  138  134   Potassium 3.5 - 5.1 mmol/L 5.0  5.1  5.3   Chloride 98 - 111 mmol/L 107  107     CO2 22 - 32 mmol/L 19  18    Calcium 8.9 - 10.3 mg/dL 10.5  10.6      Imaging: US Renal Transplant w/Doppler  Result Date: 01/09/2022 CLINICAL DATA:  Renal transplant EXAM: ULTRASOUND OF RENAL TRANSPLANT WITH RENAL DOPPLER ULTRASOUND TECHNIQUE: Ultrasound examination of the renal transplant was performed with gray-scale, color and duplex doppler evaluation. COMPARISON:  CT 01/03/2022 FINDINGS: Transplant kidney location: RIGHT iliac fossa Transplant Kidney: Renal measurements: 12.7 x 6.4 x 7.2 cm = volume: 365m. Normal in size and parenchymal echogenicity. No evidence of mass or hydronephrosis. No peri-transplant fluid collection seen. Color flow in the main renal artery:  Present Color flow in the main renal vein:  Present Duplex Doppler Evaluation: Main Renal Artery Resistive Index: 0.88 Venous waveform in main renal vein:  Present Intrarenal resistive index in upper pole:  0.64 (normal 0.6-0.8; equivocal 0.8-0.9; abnormal >= 0.9) Intrarenal resistive index in lower pole:  0.68 (normal 0.6-0.8; equivocal 0.8-0.9; abnormal >= 0.9) Bladder: Normal for degree of bladder distention. Other findings:  Ureteral jets not identified. IMPRESSION: 1. No hydronephrosis. 2. Main renal artery resistive index equivocal range. 3. Intrarenal resistive index is within normal limits. 4. Arterial and venous color flow at the hila. Electronically Signed   By: Suzy Bouchard M.D.   On: 01/18/2022 14:55   DG Chest 2 View  Result Date: 01/19/2022 CLINICAL DATA:  Leukocytosis in a 76 year old female. EXAM: CHEST - 2 VIEW COMPARISON:  March 04, 2020. FINDINGS: EKG leads project over the chest. Cardiomediastinal contours and hilar structures are stable with mild cardiac enlargement accentuated by AP technique. No lobar consolidation.  No gross effusion.  No pneumothorax. On limited assessment there is no acute skeletal finding. IMPRESSION: Mild cardiac enlargement without acute cardiopulmonary process. Electronically Signed    By: Zetta Bills M.D.   On: 01/09/2022 14:19   CT ABDOMEN PELVIS WO CONTRAST  Result Date: 12/28/2021 CLINICAL DATA:  Generalized abdominal pain for couple of weeks. Acute kidney failure. EXAM: CT ABDOMEN AND PELVIS WITHOUT CONTRAST TECHNIQUE: Multidetector CT imaging of the abdomen and pelvis was performed following the standard protocol without IV contrast. RADIATION DOSE REDUCTION: This exam was performed according to the departmental dose-optimization program which includes automated exposure control, adjustment of the mA and/or kV according to patient size and/or use of iterative reconstruction technique. COMPARISON:  MRI abdomen February 24, 2014 FINDINGS: Lower chest: No acute abnormality. Hepatobiliary: Unremarkable noncontrast appearance of the liver. Cholelithiasis without findings of acute cholecystitis. No biliary ductal dilation. Pancreas: No pancreatic ductal dilation or evidence of acute inflammation. Spleen: No splenomegaly or focal splenic lesion. Adrenals/Urinary Tract: Bilateral adrenal glands are within normal limits. Calcifications in bilateral atrophic kidneys. Right lower quadrant transplant kidney without hydronephrosis or nephrolithiasis. Urinary bladder is unremarkable for degree of distension. Stomach/Bowel: No radiopaque enteric contrast material was administered. Stomach is nondistended limiting evaluation. No pathologic dilation of small or large bowel. The appendix and terminal ileum appear normal. Scattered colonic diverticulosis without findings of acute diverticulitis. No evidence of acute bowel inflammation. 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 Infrarenal IVC filter. No pathologically enlarged abdominal or pelvic lymph nodes. Reproductive: Uterus and bilateral adnexa are unremarkable. Other: No significant abdominopelvic free fluid.  Portions of the anterior abdominal wall are excluded by combination due to patient habitus limiting evaluation. Musculoskeletal: Multilevel degenerative changes spine. No acute osseous abnormality. IMPRESSION: 1. No hydronephrosis or evidence of acute bowel inflammation. 2. Prior aorto bi-iliac stent graft repair with embolization coils in the excluded aneurysmal sac which has increased in size now measuring 7 cm, persistent endovascular leak can not be excluded on this examination. 3. 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 4. Cholelithiasis without findings of acute cholecystitis. 5.  Aortic Atherosclerosis (ICD10-I70.0). Electronically Signed   By: Dahlia Bailiff M.D.   On: 01/01/2022 13:54    Physical Exam  General: Pleasant, elderly woman laying in bed, unable to sit straight due to fatigue. No acute distress. HEENT: Neck supple with no palpable lymphadenopathy. CV: RRR. II/VI systolic flow murmur.  No LE edema Pulmonary: Lungs CTAB. Normal effort. Distant crackles on the right lower lung base.  No wheezing. Abdominal: Soft, nontender, nondistended. Normal bowel sounds. Extremities: Radial pulses 2+ and symmetric. Faint DP pulses bilaterally. Skin: Warm and dry. Hyperpigmented patch over the right  shin with chronic venous stasis changes. Neuro: A&Ox3. Moves all extremities. Normal sensation to gross touch.  Psych: Normal mood and affect   Assessment/Plan: [name] is a 76 y.o. female with hx of T2DM with autonomic neuropathy, renal transplant in 2010, abdominal aortic aneurysm s/p iliac stent, HTN, hypothyroidism, osteoarthritis, HLD and CKD 3 who presented with generalized muscle aches, progressive malaise and poor appetite and found to meet SIRS criteria and in acute on chronic renal failure.   Active Problems:   * No active hospital problems. *  #SIRS #Leukocytosis #Generalized weakness Patient with a history of renal  transplant on immunosuppressive medications found to have progressive generalized weakness and poor appetite. Work-up for an infectious etiology has been negative so far. Blood cultures remain x24 hours. UA did not show any signs of UTI. Leukocytosis slowly improving but showed lymphocytic predominance. Respiratory panel negative. We will consider work-up for possible malignancy. Patient's weight has been around 106-100 kg in the past year. We will discontinue antibiotics if blood cultures remain negative x48 hours.  PT and OT evaluated patient and recommended home health. 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 Calcium levels not significantly high but is concerning for possible malignancy in the setting of lymphocyte predominance leukocytosis and patient's history of renal transplant. Patient has no lymphadenopathy on exam. We will continue work-up for a malignancy such as non-Hodgkin's lymphoma with imaging and laboratory studies. Morphology on CBC on admission showed plasmacytoid lymphs that can be secondary to a reactive process suggestive of viral infection with hepatitis B, mumps or EBV or a lymphoproliferative B-cell disorder such as Waldenstrom's macroglobulinemia, multiple myeloma, chronic lymphocytic leukemia and EBV-associated posttransplant lymphoproliferative disease (PTLD). Multiple myeloma high on the differential as our patient has hypercalcinemia, renal failure and anemia. She also endorse back pain and pain all over which could likely be explained by a bone disease.  -CT chest -EBV PCR, LDH, CRP, hepatitis labs -PTH, PTH related protein and 1-25 vitamin D -Multiple Myeloma panel   #AKI on CKD 3 #AGMA #Hx of renal transplant Kidney  function slowly improving with IV fluids with creatinine down to 2.68 from 3.55 on admission. Hypokalemia has resolved.  Urinalysis did not show any signs of infection. Renal ultrasound negative for hydronephrosis.  -Nephrology following, appreciate recs -IV LR at 125 mL/h -Pending tacrolimus levels -Strict I&O's -Avoid nephrotoxins agents -Continue to hold home Lasix and immunosuppressive medications   #Severe hyperglycemia #T2DM, uncontrolled #Diabetic neuropathy Repeat A1c with mild improvement to 9.7% but still elevated and above goal. Blood sugars remain persistently elevated in the 190s to 230s.  Patient received 32 units of short acting over the last 24 hours. We will continue IV fluids and adjust insulin regimen. -IV LR at 125 mL/h -Increase Semglee from 20 to 30 units at bedtime -Continue SSI 3 times daily with meals   #Systolic murmur #Dyspnea on exertion Patient with no documented echo presented with fatigue and occasional dyspnea on exertion at home. Patient not fluid overloaded on exam however she was found to have a systolic murmur on auscultation of the heart. Patient has anemia will likely explain her fatigue but will consider work-up for possible heart failure.  -Echocardiogram -Telemetry   #Normocytic anemia #Thrombocytopenia Hemoglobin down to 7.2 from 8.2 yesterday. There is  no evidence of active GI bleed. Blood smear shows platelet clumping which likely explains low platelets. Both anemia and thrombocytopenia could be secondary to immunosuppressive medication versus a bone marrow disorder. Vitamin B12 and folate with all levels. -F/u pathology smear -Trend CBC -Transfuse if hgb <7  #Iron overload #Elevated ferritin Iron studies showed iron level of 110, iron sat of 51%, TIBC low at 216 and ferritin severely elevated to 2000. On chart review, patient has had elevated ferritin since 2021 but that was as double in the last 2 years. Causes of elevated ferritin includes  malignancy, iron overload, liver disease, bone marrow disease, inflammatory state, etc. malignancy remains highest on the differential but will consider other etiologies.  -Malignancy and infectious work-up as above  #Hypothyroidism Patient with a history of hypothyroidism found to have severely low TSH on admission. Free T4 slightly elevated to 1.57. Patient remains hemodynamically stable. No concern for thyrotoxicosis at the moment. Laboratory findings could likely be due to overtreatment. -CTM -Discontinued home Synthroid -F/u T3  #Abdominal aortic aneurysm #Aorto-iliac stent CT A/P today shows slight increase in aneurysmal sac to 7 cm from 5.7 cm on CT C/A/P on 08/02/2021.  Remained stable according to vascular surgery. -Continue pentoxifylline 400 mg twice daily  #Hypertension: Holding home amlodipine 10 mg daily #Hyperlipidemia: HDL <10, LDL 31 #Hyperkalemia, resolved #Hyponatremia, resolved  Diet: CM IVF: IV LR VTE: Heparin CODE: DNR  Prior to Admission Living Arrangement: Home Anticipated Discharge Location: Home with home health Barriers to Discharge: Medical stability Dispo: Anticipated discharge in approximately 1-2 day(s).   Signed: Lacinda Axon, MD 12/30/2021, 5:34 AM  Pager: [phone] Internal Medicine Teaching Service After 5pm on weekdays and 1pm on weekends: On Call pager: [phone]

## 2021-12-30 NOTE — Progress Notes (Addendum)
  Date: 12/30/2021  Patient name: [name]  Medical record number: [id number]  Date of birth: 05-29-1946   I have seen and evaluated [name] and discussed their care with the Residency Team. Briefly, [name] is a 76 year old woman with PMH of DM2, renal transplant, AAA, HTN who presented with aches, pains, weakness and poor appetite.  Her daughter, who is from out of town, noticed that she was much more fatigued and ill than she had been expecting when she came to visit.  She is also concerned that [name] is not taking her medications correctly as she has low vision.  She presented with lymphocytic predominant leukocytosis, elevated Cr, hyperkalemia, metabolic acidosis and anemia.  No clear source of infection has been discovered.  Give the leukocytosis, we will also check a RVP today which she is aware of.  Today she reports continued fatigue.  She has no swelling or lymphadenopathy that she is aware of.  Per chart review, she has not lost any significant weight.     Vitals:   12/30/21 0727 12/30/21 1025  BP: (!) 135/46 128/62  Pulse: 93 94  Resp: 18 20  Temp: 98.6 F (37 C) 98.2 F (36.8 C)  SpO2: 99% 97%   Gen: Elderly woman, sitting in bed, fatigued and needing to slump to rest Eyes: Anicteric sclerae Neck: no LAD palpable, no axillary LAD CV: RR, NR, + systolic murmur PUlm: Crackles in the RLL, clear with coughing, no wheezing, otherwise clear Abd: +BS, NT, ND MSK: normal tone and bulk for age Skin: + hyperpigmented patch over right shin  Assessment and Plan: I have seen and evaluated the patient as outlined above. I agree with the formulated Assessment and Plan as detailed in the residents' note, with the following changes:   1. SIRS, elevated WBC (lymphocytic), generalized weakness - We will add on a RVP - Ca is corrected to 11.5, concern for NHL in the setting of renal transplant - EBV PCR, LDH, CT chest - If Ca rises above 12, will need to  consider treatment - Check PTH, PTH-rp, 1-25 vitamin D - Continue fluids and monitor output - Renal is following as well for AKI  Other issues per Dr. Monte Fantasia daily note.  Sid Falcon, MD 7/8/202311:58 AM

## 2021-12-31 ENCOUNTER — Inpatient Hospital Stay (HOSPITAL_COMMUNITY): Payer: Medicare Other

## 2021-12-31 DIAGNOSIS — R0609 Other forms of dyspnea: Secondary | ICD-10-CM

## 2021-12-31 DIAGNOSIS — R6510 Systemic inflammatory response syndrome (SIRS) of non-infectious origin without acute organ dysfunction: Secondary | ICD-10-CM

## 2021-12-31 LAB — CBC WITH DIFFERENTIAL/PLATELET
Abs Immature Granulocytes: 0.54 10*3/uL — ABNORMAL HIGH (ref 0.00–0.07)
Basophils Absolute: 0.1 10*3/uL (ref 0.0–0.1)
Basophils Relative: 0 %
Eosinophils Absolute: 0.2 10*3/uL (ref 0.0–0.5)
Eosinophils Relative: 1 %
HCT: 20.8 % — ABNORMAL LOW (ref 36.0–46.0)
Hemoglobin: 7.2 g/dL — ABNORMAL LOW (ref 12.0–15.0)
Immature Granulocytes: 2 %
Lymphocytes Relative: 42 %
Lymphs Abs: 14.2 10*3/uL — ABNORMAL HIGH (ref 0.7–4.0)
MCH: 28.3 pg (ref 26.0–34.0)
MCHC: 34.6 g/dL (ref 30.0–36.0)
MCV: 81.9 fL (ref 80.0–100.0)
Monocytes Absolute: 12.8 10*3/uL — ABNORMAL HIGH (ref 0.1–1.0)
Monocytes Relative: 38 %
Neutro Abs: 5.6 10*3/uL (ref 1.7–7.7)
Neutrophils Relative %: 17 %
Platelets: 94 10*3/uL — ABNORMAL LOW (ref 150–400)
RBC: 2.54 MIL/uL — ABNORMAL LOW (ref 3.87–5.11)
RDW: 18.5 % — ABNORMAL HIGH (ref 11.5–15.5)
WBC: 33.4 10*3/uL — ABNORMAL HIGH (ref 4.0–10.5)
nRBC: 0.3 % — ABNORMAL HIGH (ref 0.0–0.2)

## 2021-12-31 LAB — ECHOCARDIOGRAM COMPLETE
AR max vel: 2.08 cm2
AV Area VTI: 2.48 cm2
AV Area mean vel: 2.27 cm2
AV Mean grad: 9.5 mmHg
AV Peak grad: 20.2 mmHg
Ao pk vel: 2.25 m/s
Area-P 1/2: 3.08 cm2
Height: 67 in
S' Lateral: 2.7 cm
Weight: 3174.4 oz

## 2021-12-31 LAB — RENAL FUNCTION PANEL
Albumin: 3.4 g/dL — ABNORMAL LOW (ref 3.5–5.0)
Anion gap: 12 (ref 5–15)
BUN: 45 mg/dL — ABNORMAL HIGH (ref 8–23)
CO2: 19 mmol/L — ABNORMAL LOW (ref 22–32)
Calcium: 11.2 mg/dL — ABNORMAL HIGH (ref 8.9–10.3)
Chloride: 111 mmol/L (ref 98–111)
Creatinine, Ser: 2.29 mg/dL — ABNORMAL HIGH (ref 0.44–1.00)
GFR, Estimated: 22 mL/min — ABNORMAL LOW (ref >60)
Glucose, Bld: 233 mg/dL — ABNORMAL HIGH (ref 70–99)
Phosphorus: 3.4 mg/dL (ref 2.5–4.6)
Potassium: 5 mmol/L (ref 3.5–5.1)
Sodium: 142 mmol/L (ref 135–145)

## 2021-12-31 LAB — URINE CULTURE: Culture: <10000 — AB

## 2021-12-31 LAB — GLUCOSE, CAPILLARY
Glucose-Capillary: 239 mg/dL — ABNORMAL HIGH (ref 70–99)
Glucose-Capillary: 245 mg/dL — ABNORMAL HIGH (ref 70–99)
Glucose-Capillary: 194 mg/dL — ABNORMAL HIGH (ref 70–99)
Glucose-Capillary: 189 mg/dL — ABNORMAL HIGH (ref 70–99)
Glucose-Capillary: 236 mg/dL — ABNORMAL HIGH (ref 70–99)

## 2021-12-31 LAB — C-REACTIVE PROTEIN: CRP: 6.1 mg/dL — ABNORMAL HIGH (ref <1.0)

## 2021-12-31 LAB — SEDIMENTATION RATE: Sed Rate: 12 mm/hr (ref 0–22)

## 2021-12-31 LAB — HEPATITIS PANEL, ACUTE
HCV Ab: NONREACTIVE
Hep A IgM: NONREACTIVE
Hep B C IgM: NONREACTIVE
Hepatitis B Surface Ag: NONREACTIVE

## 2021-12-31 LAB — T3: T3, Total: 87 ng/dL (ref 71–180)

## 2021-12-31 MED ORDER — GUAIFENESIN 100 MG/5ML PO LIQD
5.0000 mL | ORAL | Status: DC | PRN
Start: 2021-12-31 — End: 2022-01-04
  Administered 2021-12-31: 5 mL via ORAL
  Filled 2021-12-31: qty 10

## 2021-12-31 MED ORDER — TACROLIMUS 1 MG PO CAPS
6.0000 mg | ORAL_CAPSULE | Freq: Two times a day (BID) | ORAL | Status: DC
  Administered 2021-12-31 (×2): 6 mg via ORAL
  Filled 2021-12-31 (×2): qty 6

## 2021-12-31 MED ORDER — MAGNESIUM SULFATE 2 GM/50ML IV SOLN
2.0000 g | Freq: Once | INTRAVENOUS | Status: AC
  Administered 2021-12-31: 2 g via INTRAVENOUS
  Filled 2021-12-31: qty 50

## 2021-12-31 MED ORDER — VANCOMYCIN HCL IN DEXTROSE 1-5 GM/200ML-% IV SOLN
1000.0000 mg | INTRAVENOUS | Status: DC
  Filled 2021-12-31: qty 200

## 2021-12-31 MED ORDER — MYCOPHENOLATE MOFETIL 250 MG PO CAPS
500.0000 mg | ORAL_CAPSULE | Freq: Two times a day (BID) | ORAL | Status: DC
  Administered 2021-12-31 (×2): 500 mg via ORAL
  Filled 2021-12-31 (×7): qty 2

## 2021-12-31 MED ORDER — INSULIN GLARGINE-YFGN 100 UNIT/ML ~~LOC~~ SOLN
35.0000 [IU] | Freq: Every day | SUBCUTANEOUS | Status: DC
  Administered 2021-12-31: 35 [IU] via SUBCUTANEOUS
  Filled 2021-12-31 (×2): qty 0.35

## 2021-12-31 NOTE — Progress Notes (Signed)
Subjective:   Summary: [name] is a 76 y.o. year old female currently admitted on the IMTS HD#2 for possible sepsis and acute on chronic renal failure.  Overnight Events: - Seen by PT and OT yesterday, recommended home health PT - Seen by nephrology yesterday, recommendations as follows: Continue fluids, follow-up Prograf level/EBV PCR/LDH/culture. - Reports feeling worsening malaise, nausea and vomiting.  Denies active fevers, night sweats, weight loss, shortness of breath, or chest pain. - Endorses back pain but mentions this is chronic without any significant worsening in the short-term.  Objective:  Vital signs in last 24 hours: Vitals:   12/30/21 2230 12/31/21 0100 12/31/21 0300 12/31/21 0716  BP:   (!) 121/54 (!) 123/43  Pulse:   96 96  Resp: (!) _0 Temp:   99.3 F (37.4 C) 99.4 F (37.4 C)  TempSrc:   Oral Oral  SpO2:   (!) 89%   Weight:   90 kg   Height:       Supplemental O2: Room Air SpO2: (!) 89 % Rare desaturations at night down to 89% on room air.  Physical Exam:  Constitutional: elderly woman sitting up in bed, appears fatigued and uncomfortable. No acute distress.  HEENT: Neck supple with no palpable lymphadenopathy. CV: RRR.  2/6 systolic ejection murmur no LE edema Pulmonary: Lungs CTAB. Normal effort  No wheezing.  No axillary lymphadenopathy noted Abdominal: Soft, nontender, nondistended Extremities: Radial pulses 2+ and symmetric. Faint DP pulses bilaterally. Skin: Warm and dry. Hyperpigmented patch over the right shin with chronic venous stasis changes.  Filed Weights   01/12/2022 1232 12/31/21 0300  Weight: 100.3 kg 90 kg     Intake/Output Summary (Last 24 hours) at 12/31/2021 0813 Last data filed at 12/31/2021 0000 Gross per 24 hour  Intake 2656.09 ml  Output 700 ml  Net 1956.09 ml   Net IO Since Admission: 3,591.02 mL [12/31/21 0813]  Pertinent Labs:    Latest Ref Rng & Units 12/31/2021    2:41 AM  01/03/2022   11:46 PM 01/01/2022    3:37 PM  CBC  WBC 4.0 - 10.5 K/uL 33.4  27.3    Hemoglobin 12.0 - 15.0 g/dL 7.2  7.2  10.2   Hematocrit 36.0 - 46.0 % 20.8  20.8  30.0   Platelets 150 - 400 K/uL 94  91    RDW 18.5, absolute lymphs 14.2, absolute monocytes 12.8, absolute immature granulocytes 0.54     Latest Ref Rng & Units 12/31/2021    2:41 AM 12/30/2021    8:59 AM 01/08/2022   11:46 PM  CMP  Glucose 70 - 99 mg/dL 233  243  329   BUN 8 - 23 mg/dL 45  58  67   Creatinine 0.44 - 1.00 mg/dL 2.29  2.68  3.00   Sodium 135 - 145 mmol/L 142  141  136   Potassium 3.5 - 5.1 mmol/L 5.0  4.8  5.0   Chloride 98 - 111 mmol/L 111  108  107   CO2 22 - 32 mmol/L _1 Calcium 8.9 - 10.3 mg/dL 11.2  11.0  10.5   CRP 6.1, ESR 12 Hepatitis panel nonreactive Last 3 CBGs 194, 245, 239 LDH 615 Urine sodium 50, urine creatinine 99.92, UA positive for protein, trace leukocytes, and rare bacteria Respiratory virus panel normal Multiple myeloma panel, EBV  ACR, calcitriol, PTH, PTH related peptide, tacrolimus level pending   Imaging: CT CHEST WO CONTRAST  Result Date: 12/30/2021 CLINICAL DATA:  Chronic dyspnea evaluate for possible malignancy EXAM: CT CHEST WITHOUT CONTRAST TECHNIQUE: Multidetector CT imaging of the chest was performed following the standard protocol without IV contrast. RADIATION DOSE REDUCTION: This exam was performed according to the departmental dose-optimization program which includes automated exposure control, adjustment of the mA and/or kV according to patient size and/or use of iterative reconstruction technique. COMPARISON:  None Available. FINDINGS: Cardiovascular: Aortic atherosclerosis. Normal heart size. Left and right coronary artery calcifications. No pericardial effusion. Enlargement of the main pulmonary artery measuring 3.7 cm. Mediastinum/Nodes: No enlarged mediastinal, hilar, or axillary lymph nodes. Small hiatal hernia. Thyroid gland, trachea, and esophagus demonstrate  no significant findings. Lungs/Pleura: Lungs are clear. No pleural effusion or pneumothorax. Upper Abdomen: No acute abnormality. Severely atrophic kidneys, partially imaged. Musculoskeletal: No chest wall abnormality. No suspicious osseous lesions identified. IMPRESSION: 1. No suspicious pulmonary nodules. No evidence of mass or lymphadenopathy in the chest. 2. No acute abnormality of the lungs. 3. Enlargement of the main pulmonary artery, as can be seen in pulmonary hypertension. 4. Coronary artery disease. Aortic Atherosclerosis (ICD10-I70.0). Electronically Signed   By: Delanna Ahmadi M.D.   On: 12/30/2021 15:26     Renal ultrasound (12/23/2021): No hydronephrosis, main renal artery resistive index in equivocal range, intrarenal resistive index is within normal limits, arterial and venous color-flow at the hilum  EKG: On continuous monitoring.  6 beat run of V. tach overnight.  Multiple occasions of PVCs.  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 with a pertinent PMH of type 2 diabetes with neuropathy, renal transplant in 2010, AAA status post iliac stent, hypothyroidism, osteoarthritis, HLD, and CKD 3, who presented with general aches and malaise and poor appetite and admitted for SIRS criteria and acute on chronic renal failure, now with concern for possible lymphoproliferative malignancy.    #SIRS #Leukocytosis #Generalized weakness Patient with a history of renal transplant on immunosuppressive medications found to have progressive generalized weakness and poor appetite.  Code sepsis activated due to patient meeting SIRS criteria, and patient initially placed on broad-spectrum coverage, however no source identified yet and infectious work-up so far is  negative including blood cultures x2, respiratory viral panel, UA with culture pending.  All previous imaging revealed no sources of infection or focal lesions. Subsequent work-up including CBC today showing white count of 33.4 with lymphocytic/monocytic predominance and peripheral smear showing abnormal lymphocytosis concerning for possible lymphoproliferative malignancy.  We will follow-up on prior labs and add flow cytometry to further investigate.  We will discontinue antibiotics given negative infection work-up so far. 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 Calcium levels not significantly high but is concerning for possible malignancy in the setting of lymphocyte predominance leukocytosis and patient's history of renal transplant. Patient has no lymphadenopathy on exam. We will continue work-up for a malignancy such as non-Hodgkin's lymphoma with imaging and laboratory studies.  Primary concerns now include lymphoproliferative disorder versus multiple myeloma, but we will continue to explore other options. -EBV PCR, LDH, CRP,  hepatitis labs -PTH, PTH related protein and 1-25 vitamin D -Multiple Myeloma panel   #AKI on CKD 3 #AGMA #Hx of renal transplant Kidney function slowly improving with IV fluids with creatinine down to 2.29 from 3.55 on admission. Hypokalemia has resolved.  Urinalysis did not show any signs of infection. Renal ultrasound negative for hydronephrosis.  -Nephrology following, appreciate recs -IV LR at 125 mL/h -Pending tacrolimus levels -Strict I&O's -Avoid nephrotoxins agents -Continue to hold home Lasix and immunosuppressive medications   #Severe hyperglycemia #T2DM, uncontrolled #Diabetic neuropathy Repeat A1c with mild improvement to 9.7% but still elevated and above goal. Blood  sugars remain persistently elevated 194-245.  Patient received 20 units of short acting and 30 units of long-acting over the last 24 hours. We will continue IV fluids and adjust insulin regimen. -IV LR at 125 mL/h -Increase Semglee to 35 units daily and continue sliding scale insulin Night coverage   #Systolic murmur #Dyspnea on exertion Patient with no documented echo presented with fatigue and occasional dyspnea on exertion at home.  Patient not showing any symptoms or exam signs of heart failure but will formally assess. -Follow-up echocardiogram -Telemetry   #Normocytic anemia #Thrombocytopenia Hemoglobin stable at 7.2. There is no evidence of active GI bleed. Blood smear concerning for malignancy.  We will follow-up prior labs. -Trend CBC -Transfuse if hgb <7   #Iron overload #Elevated ferritin Iron studies showed iron level of 110, iron sat of 51%, TIBC low at 216 and ferritin severely elevated to 2000. On chart review, patient has had elevated ferritin since 2021 but that was as double in the last 2 years.  Elevated ferritin likely due to possible lymphoproliferative malignancy.  -Malignancy and infectious work-up as above   #Hypothyroidism Patient with a history of hypothyroidism found to have severely low TSH on admission. Free T4 slightly elevated to 1.57. Patient remains hemodynamically stable. No concern for thyrotoxicosis at the moment. Laboratory findings could likely be due to overtreatment. -CTM -Discontinued home Synthroid -F/u T3   #Abdominal aortic aneurysm #Aorto-iliac stent CT A/P today shows slight increase in aneurysmal sac to 7 cm from 5.7 cm on CT C/A/P on 08/02/2021.  Remained stable according to vascular surgery. -Continue pentoxifylline 400 mg twice daily   #Hypertension: Holding home amlodipine 10 mg daily #Hyperlipidemia: HDL <10, LDL 31 #Hyperkalemia, resolved #Hyponatremia, resolved  Diet: Carb-Modified IVF: LR,125cc/hr VTE: Heparin Code:  DNR PT/OT recs: Home Health, none. TOC recs:  Family Update:   Dispo: Anticipated discharge pending diagnostic work-up currently concerning for malignant.   Linus Galas, MD PGY-1 Internal Medicine Resident Please contact the on call pager after 5 pm and on weekends at [phone].

## 2021-12-31 NOTE — Plan of Care (Signed)

## 2021-12-31 NOTE — Progress Notes (Signed)
*  PRELIMINARY RESULTS* Echocardiogram 2D Echocardiogram has been performed.  [name] 12/31/2021, 3:30 PM

## 2021-12-31 NOTE — Progress Notes (Signed)
Patient ID: [name], female   DOB: 05/29/1946, 76 y.o.   MRN: [id number] [facility] Progress Note   Assessment/ Plan:   1.  Acute kidney injury on chronic kidney disease stage III T: Based on the history and physical exam, appears to be largely hemodynamically mediated acute kidney injury especially with concomitant hyperglycemia and significantly elevated BUN: Creatinine ratio.  No evidence of GN and without growth on urine culture.  Renal function improving with intravenous fluids.  Tacrolimus level pending. 2.  Anion gap metabolic acidosis: Secondary to acute kidney injury, continue to monitor with ongoing intravenous fluids and glycemic management. 3.  Hypercalcemia: With elevated ionized calcium and normal 25-hydroxy vitamin D level.  PTH and 1-25-hydroxy vitamin D levels are pending.  Phosphorus level is normal pointing away from hyperparathyroidism and we are undertaking evaluation for paraneoplastic hypercalcemia. 4.  Generalized weakness/pain: On broad-spectrum antimicrobial coverage with cefepime and vancomycin.  Cultures negative to date with no clear focus of infection noted. 5.  Hypertension: Blood pressure under decent control on amlodipine.  With enlarging AAA and outpatient vascular surgery at [facility] endovascular recommended. 6.  Insulin-dependent type 2 diabetes mellitus: Resume insulin at this time and diet per primary service.  Subjective:   No acute events overnight, denies any chest pain or shortness of breath.  Needs assistance to bedside commode.   Objective:   BP (!) 123/43 (BP Location: Right Arm)   Pulse 96   Temp 99.4 F (37.4 C) (Oral)   Resp 20   Ht '5\' 7"'$  (1.702 m)   Wt 90 kg   SpO2 (!) 89%   BMI 31.07 kg/m   Intake/Output Summary (Last 24 hours) at 12/31/2021 2992 Last data filed at 12/31/2021 0000 Gross per 24 hour  Intake 2896.09 ml  Output 1300 ml  Net 1596.09 ml   Weight change: -10.3 kg  Physical Exam: Gen: Appears  uncomfortable sitting up in bed, awaiting assistance to get to the bedside commode CVS: Pulse regular rhythm, normal rate, S1 and S2 normal Resp: Clear to auscultation bilaterally, no rales/rhonchi Abd: Soft, obese, mild right lower quadrant tenderness Ext: No lower extremity edema  Imaging: CT CHEST WO CONTRAST  Result Date: 12/30/2021 CLINICAL DATA:  Chronic dyspnea evaluate for possible malignancy EXAM: CT CHEST WITHOUT CONTRAST TECHNIQUE: Multidetector CT imaging of the chest was performed following the standard protocol without IV contrast. RADIATION DOSE REDUCTION: This exam was performed according to the departmental dose-optimization program which includes automated exposure control, adjustment of the mA and/or kV according to patient size and/or use of iterative reconstruction technique. COMPARISON:  None Available. FINDINGS: Cardiovascular: Aortic atherosclerosis. Normal heart size. Left and right coronary artery calcifications. No pericardial effusion. Enlargement of the main pulmonary artery measuring 3.7 cm. Mediastinum/Nodes: No enlarged mediastinal, hilar, or axillary lymph nodes. Small hiatal hernia. Thyroid gland, trachea, and esophagus demonstrate no significant findings. Lungs/Pleura: Lungs are clear. No pleural effusion or pneumothorax. Upper Abdomen: No acute abnormality. Severely atrophic kidneys, partially imaged. Musculoskeletal: No chest wall abnormality. No suspicious osseous lesions identified. IMPRESSION: 1. No suspicious pulmonary nodules. No evidence of mass or lymphadenopathy in the chest. 2. No acute abnormality of the lungs. 3. Enlargement of the main pulmonary artery, as can be seen in pulmonary hypertension. 4. Coronary artery disease. Aortic Atherosclerosis (ICD10-I70.0). Electronically Signed   By: Delanna Ahmadi M.D.   On: 12/30/2021 15:26   US Renal Transplant w/Doppler  Result Date: 12/31/2021 CLINICAL DATA:  Renal transplant EXAM: ULTRASOUND OF RENAL TRANSPLANT  WITH  RENAL DOPPLER ULTRASOUND TECHNIQUE: Ultrasound examination of the renal transplant was performed with gray-scale, color and duplex doppler evaluation. COMPARISON:  CT 01/18/2022 FINDINGS: Transplant kidney location: RIGHT iliac fossa Transplant Kidney: Renal measurements: 12.7 x 6.4 x 7.2 cm = volume: 336m. Normal in size and parenchymal echogenicity. No evidence of mass or hydronephrosis. No peri-transplant fluid collection seen. Color flow in the main renal artery:  Present Color flow in the main renal vein:  Present Duplex Doppler Evaluation: Main Renal Artery Resistive Index: 0.88 Venous waveform in main renal vein:  Present Intrarenal resistive index in upper pole:  0.64 (normal 0.6-0.8; equivocal 0.8-0.9; abnormal >= 0.9) Intrarenal resistive index in lower pole: 0.68 (normal 0.6-0.8; equivocal 0.8-0.9; abnormal >= 0.9) Bladder: Normal for degree of bladder distention. Other findings:  Ureteral jets not identified. IMPRESSION: 1. No hydronephrosis. 2. Main renal artery resistive index equivocal range. 3. Intrarenal resistive index is within normal limits. 4. Arterial and venous color flow at the hila. Electronically Signed   By: SSuzy BouchardM.D.   On: 12/25/2021 14:55   DG Chest 2 View  Result Date: 01/11/2022 CLINICAL DATA:  Leukocytosis in a 76year old female. EXAM: CHEST - 2 VIEW COMPARISON:  March 04, 2020. FINDINGS: EKG leads project over the chest. Cardiomediastinal contours and hilar structures are stable with mild cardiac enlargement accentuated by AP technique. No lobar consolidation.  No gross effusion.  No pneumothorax. On limited assessment there is no acute skeletal finding. IMPRESSION: Mild cardiac enlargement without acute cardiopulmonary process. Electronically Signed   By: GZetta BillsM.D.   On: 01/18/2022 14:19   CT ABDOMEN PELVIS WO CONTRAST  Result Date: 01/16/2022 CLINICAL DATA:  Generalized abdominal pain for couple of weeks. Acute kidney failure. EXAM: CT ABDOMEN AND  PELVIS WITHOUT CONTRAST TECHNIQUE: Multidetector CT imaging of the abdomen and pelvis was performed following the standard protocol without IV contrast. RADIATION DOSE REDUCTION: This exam was performed according to the departmental dose-optimization program which includes automated exposure control, adjustment of the mA and/or kV according to patient size and/or use of iterative reconstruction technique. COMPARISON:  MRI abdomen February 24, 2014 FINDINGS: Lower chest: No acute abnormality. Hepatobiliary: Unremarkable noncontrast appearance of the liver. Cholelithiasis without findings of acute cholecystitis. No biliary ductal dilation. Pancreas: No pancreatic ductal dilation or evidence of acute inflammation. Spleen: No splenomegaly or focal splenic lesion. Adrenals/Urinary Tract: Bilateral adrenal glands are within normal limits. Calcifications in bilateral atrophic kidneys. Right lower quadrant transplant kidney without hydronephrosis or nephrolithiasis. Urinary bladder is unremarkable for degree of distension. Stomach/Bowel: No radiopaque enteric contrast material was administered. Stomach is nondistended limiting evaluation. No pathologic dilation of small or large bowel. The appendix and terminal ileum appear normal. Scattered colonic diverticulosis without findings of acute diverticulitis. No evidence of acute bowel inflammation. 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 Infrarenal IVC filter. No pathologically enlarged abdominal or pelvic lymph nodes. Reproductive: Uterus and bilateral adnexa are unremarkable. Other: No significant abdominopelvic free fluid. Portions of the anterior abdominal wall are excluded by combination due to patient habitus limiting evaluation. Musculoskeletal: Multilevel degenerative changes spine. No acute osseous abnormality. IMPRESSION: 1. No  hydronephrosis or evidence of acute bowel inflammation. 2. Prior aorto bi-iliac stent graft repair with embolization coils in the excluded aneurysmal sac which has increased in size now measuring 7 cm, persistent endovascular leak can not be excluded on this examination. 3. Calcified  atrophic bilateral native kidneys with right lower quadrant transplant kidney. 4. Cholelithiasis without findings of acute cholecystitis. 5.  Aortic Atherosclerosis (ICD10-I70.0). Electronically Signed   By: Dahlia Bailiff M.D.   On: 12/28/2021 13:54    Labs: BMET Recent Labs  Lab 01/13/2022 1039 12/28/2021 1537 01/07/2022 2052 01/11/2022 2346 12/30/21 0859 12/31/21 0241  NA 131* 134* 138 136 141 142  K 5.4* 5.3* 5.1 5.0 4.8 5.0  CL 100  --  107 107 108 111  CO2 17*  --  18* 19* 18* 19*  GLUCOSE 454*  --  306* 329* 243* 233*  BUN 70*  --  70* 67* 58* 45*  CREATININE 3.55*  --  3.16* 3.00* 2.68* 2.29*  CALCIUM 10.6*  --  10.6* 10.5* 11.0* 11.2*  PHOS  --   --  4.5 4.3 4.1 3.4   CBC Recent Labs  Lab 01/22/2022 1039 01/01/2022 1537 12/24/2021 2346 12/31/21 0241  WBC 33.9*  --  27.3* 33.4*  NEUTROABS 11.5*  --   --  5.6  HGB 8.2* 10.2* 7.2* 7.2*  HCT 23.9* 30.0* 20.8* 20.8*  MCV 82.4  --  81.3 81.9  PLT 101*  --  91* 94*    Medications:     aspirin EC  81 mg Oral Daily   heparin injection (subcutaneous)  5,000 Units Subcutaneous Q8H   insulin aspart  0-15 Units Subcutaneous TID WC   insulin aspart  0-5 Units Subcutaneous QHS   insulin glargine-yfgn  30 Units Subcutaneous QHS   pantoprazole  40 mg Oral Daily   pentoxifylline  400 mg Oral BID    Elmarie Shiley, MD 12/31/2021, 7:33 AM

## 2021-12-31 NOTE — Progress Notes (Signed)
Pharmacy Antibiotic Note  [name] is a 76 y.o. female admitted on 01/22/2022 with sepsis. PMH significant for renal transplant in 2010 on immunosuppressants, hx of DVT, abdominal aortic aneurysm, DM, and HTN. WBC 33.4 and Tmax 99.4. Pharmacy has been consulted for cefepime and vancomycin dosing. Scr improved 3.55 >> 2.29.   Plan: Change vancomycin to 1,000 mg IV Q48h (eAUC 509.8, Scr 2.29)  > goal AUC 400-550  > obtain vancomycin levels at steady state  Continue cefepime 2g IV Q24h F/u renal function/improvement of AKI, s/sx infection, temp daily  Follow up micro results and de-escalate as appropriate   Height: '5\' 7"'$  (170.2 cm) Weight: 90 kg (198 lb 6.4 oz) IBW/kg (Calculated) : 61.6  Temp (24hrs), Avg:98.7 F (37.1 C), Min:98 F (36.7 C), Max:99.4 F (37.4 C)  Recent Labs  Lab 12/28/2021 1039 12/28/2021 1411 01/16/2022 1630 12/23/2021 2052 01/02/2022 2346 12/30/21 0859 12/31/21 0241  WBC 33.9*  --   --   --  27.3*  --  33.4*  CREATININE 3.55*  --   --  3.16* 3.00* 2.68* 2.29*  LATICACIDVEN  --  2.3* 1.9  --   --   --   --      Estimated Creatinine Clearance: 24.8 mL/min (A) (by C-G formula based on SCr of 2.29 mg/dL (H)).    Allergies  Allergen Reactions   Hectorol [Doxercalciferol] Anaphylaxis and Shortness Of Breath   Latex Anaphylaxis and Other (See Comments)    discharge   Other Rash    Thin toilet tissue and medical paper table covering causes rash.   Vitamin D Analogs Anaphylaxis and Shortness Of Breath    Could not catch breath and had bad reaction   Coumadin [Warfarin Sodium] Other (See Comments)    Hemorhage. Has IVC filter   Lisinopril Cough    Antimicrobials this admission: Vancomycin 7/7 > Flagyl 7/7 x1  Cefepime 7/7 >  Dose adjustments this admission: 7/9- Vancomycin changed to 1g IV Q48h   Microbiology results: 7/7 BCx: NGTD 7/7 UCx: sent   Thank you for allowing pharmacy to be a part of this patient's care.  Eliseo Gum,  PharmD PGY1 Pharmacy Resident   12/31/2021  7:24 AM

## 2022-01-01 ENCOUNTER — Inpatient Hospital Stay (HOSPITAL_COMMUNITY): Payer: Medicare Other

## 2022-01-01 ENCOUNTER — Encounter: Payer: Medicare Other | Admitting: Student in an Organized Health Care Education/Training Program

## 2022-01-01 DIAGNOSIS — R6510 Systemic inflammatory response syndrome (SIRS) of non-infectious origin without acute organ dysfunction: Secondary | ICD-10-CM | POA: Diagnosis not present

## 2022-01-01 LAB — CBC WITH DIFFERENTIAL/PLATELET
Abs Immature Granulocytes: 0 10*3/uL (ref 0.00–0.07)
Basophils Absolute: 0 10*3/uL (ref 0.0–0.1)
Basophils Relative: 0 %
Eosinophils Absolute: 0.4 10*3/uL (ref 0.0–0.5)
Eosinophils Relative: 1 %
HCT: 17.9 % — ABNORMAL LOW (ref 36.0–46.0)
Hemoglobin: 6.1 g/dL — CL (ref 12.0–15.0)
Lymphocytes Relative: 46 %
Lymphs Abs: 17.4 10*3/uL — ABNORMAL HIGH (ref 0.7–4.0)
MCH: 28 pg (ref 26.0–34.0)
MCHC: 34.1 g/dL (ref 30.0–36.0)
MCV: 82.1 fL (ref 80.0–100.0)
Monocytes Absolute: 3.8 10*3/uL — ABNORMAL HIGH (ref 0.1–1.0)
Monocytes Relative: 10 %
Neutro Abs: 16.3 10*3/uL — ABNORMAL HIGH (ref 1.7–7.7)
Neutrophils Relative %: 43 %
Platelets: 75 10*3/uL — ABNORMAL LOW (ref 150–400)
RBC: 2.18 MIL/uL — ABNORMAL LOW (ref 3.87–5.11)
RDW: 19.3 % — ABNORMAL HIGH (ref 11.5–15.5)
Smear Review: DECREASED
WBC: 37.9 10*3/uL — ABNORMAL HIGH (ref 4.0–10.5)
nRBC: 0.4 % — ABNORMAL HIGH (ref 0.0–0.2)

## 2022-01-01 LAB — URINALYSIS, ROUTINE W REFLEX MICROSCOPIC
Bilirubin Urine: NEGATIVE
Glucose, UA: 50 mg/dL — AB
Ketones, ur: 5 mg/dL — AB
Leukocytes,Ua: NEGATIVE
Nitrite: NEGATIVE
Protein, ur: 30 mg/dL — AB
Specific Gravity, Urine: 1.013 (ref 1.005–1.030)
pH: 5 (ref 5.0–8.0)

## 2022-01-01 LAB — RENAL FUNCTION PANEL
Albumin: 3.2 g/dL — ABNORMAL LOW (ref 3.5–5.0)
Anion gap: 14 (ref 5–15)
BUN: 37 mg/dL — ABNORMAL HIGH (ref 8–23)
CO2: 16 mmol/L — ABNORMAL LOW (ref 22–32)
Calcium: 10.7 mg/dL — ABNORMAL HIGH (ref 8.9–10.3)
Chloride: 108 mmol/L (ref 98–111)
Creatinine, Ser: 2.4 mg/dL — ABNORMAL HIGH (ref 0.44–1.00)
GFR, Estimated: 21 mL/min — ABNORMAL LOW (ref >60)
Glucose, Bld: 249 mg/dL — ABNORMAL HIGH (ref 70–99)
Phosphorus: 4.1 mg/dL (ref 2.5–4.6)
Potassium: 4.9 mmol/L (ref 3.5–5.1)
Sodium: 138 mmol/L (ref 135–145)

## 2022-01-01 LAB — GLUCOSE, CAPILLARY
Glucose-Capillary: 250 mg/dL — ABNORMAL HIGH (ref 70–99)
Glucose-Capillary: 245 mg/dL — ABNORMAL HIGH (ref 70–99)
Glucose-Capillary: 220 mg/dL — ABNORMAL HIGH (ref 70–99)
Glucose-Capillary: 218 mg/dL — ABNORMAL HIGH (ref 70–99)

## 2022-01-01 LAB — HEMOGLOBIN AND HEMATOCRIT, BLOOD
HCT: 22.1 % — ABNORMAL LOW (ref 36.0–46.0)
Hemoglobin: 7.5 g/dL — ABNORMAL LOW (ref 12.0–15.0)

## 2022-01-01 LAB — PARATHYROID HORMONE, INTACT (NO CA): PTH: 16 pg/mL (ref 15–65)

## 2022-01-01 LAB — CALCITRIOL (1,25 DI-OH VIT D): Vit D, 1,25-Dihydroxy: 15 pg/mL — ABNORMAL LOW (ref 24.8–81.5)

## 2022-01-01 LAB — PREPARE RBC (CROSSMATCH)

## 2022-01-01 MED ORDER — INSULIN GLARGINE-YFGN 100 UNIT/ML ~~LOC~~ SOLN
38.0000 [IU] | Freq: Every day | SUBCUTANEOUS | Status: DC
  Administered 2022-01-01: 38 [IU] via SUBCUTANEOUS
  Filled 2022-01-01 (×2): qty 0.38

## 2022-01-01 MED ORDER — FUROSEMIDE 10 MG/ML IJ SOLN
40.0000 mg | Freq: Once | INTRAMUSCULAR | Status: AC
  Administered 2022-01-01: 40 mg via INTRAVENOUS
  Filled 2022-01-01: qty 4

## 2022-01-01 MED ORDER — SODIUM CHLORIDE 0.9% IV SOLUTION: Freq: Once | INTRAVENOUS | Status: AC

## 2022-01-01 MED ORDER — SODIUM CHLORIDE 0.9 % IV SOLN
1.0000 g | INTRAVENOUS | Status: DC
  Administered 2022-01-01 – 2022-01-02 (×2): 1 g via INTRAVENOUS
  Filled 2022-01-01 (×2): qty 10

## 2022-01-01 MED ORDER — SODIUM BICARBONATE 650 MG PO TABS: 650.0000 mg | ORAL_TABLET | Freq: Two times a day (BID) | ORAL | Status: DC

## 2022-01-01 NOTE — Progress Notes (Addendum)
Patient ID: [name], female   DOB: 26-Oct-1945, 76 y.o.   MRN: [id number] [facility] Progress Note   Assessment/ Plan:   1.  Acute kidney injury on chronic kidney disease stage III T: Based on the history and physical exam, appears to be largely hemodynamically mediated acute kidney injury especially with concomitant hyperglycemia and significantly elevated BUN: Creatinine ratio.  No evidence of GN and without growth on urine culture.  Renal function has improved with intravenous fluids from peak 7/7 3.55 to mid 2s and remains stable today.  Tacrolimus level pending. 2.  Anion gap metabolic acidosis: Secondary to acute kidney injury. Start low dose oral bicarb.  3.  Hypercalcemia: With elevated ionized calcium and normal 25-hydroxy vitamin D level.  PTH, PTHrp, myeloma w/u and 1-25-hydroxy vitamin D levels are pending.  Phosphorus level is normal pointing away from hyperparathyroidism.    4.  Generalized weakness/pain: Was broad-spectrum antimicrobial coverage with cefepime and vancomycin but cultures negative to date with no clear focus of infection noted these are d/c'd.  5.  Hypertension: Blood pressure under decent control on amlodipine.  With enlarging AAA and outpatient vascular surgery at [facility] endovascular recommended. 6.  Insulin-dependent type 2 diabetes mellitus: meds and diet per primary service. 7.  AMS:  awake and answered simple question but not answering orientation questions.  Defer additional w/u to primary.   8. Anemia, normocytic - Hb down to 6.1 this AM without obvious bleeding.  Defer transfusion to primary.  Iron, folate, B12 replete  In setting of unexplained leukocytosis and thrombocytopenia + hyperCa myeloma w/u per above.    Subjective:   This morning not answering questions except "no" when asked if abd pain with palpation.     Objective:   BP (!) 138/46 (BP Location: Right Arm)   Pulse (!) 106   Temp 99.9 F (37.7 C) (Oral)   Resp (!)  22   Ht '5\' 7"'$  (1.702 m)   Wt 88.6 kg Comment: Patient unable to stand very sleepy NG NT/NS  SpO2 91%   BMI 30.59 kg/m   Intake/Output Summary (Last 24 hours) at 01/01/2022 0851 Last data filed at 01/01/2022 0159 Gross per 24 hour  Intake 1776.5 ml  Output 750 ml  Net 1026.5 ml    Weight change: -1.394 kg  Physical Exam: Gen: Sitting up in bed arousable but not answering most questions, does not appear in distress CVS: Pulse regular rhythm, normal rate, S1 and S2 normal Resp: Clear to auscultation bilaterally, no rales/rhonchi Abd: Soft, obese, nontender with deep palpation Ext: No lower extremity edema  Imaging: ECHOCARDIOGRAM COMPLETE  Result Date: 12/31/2021    ECHOCARDIOGRAM REPORT   Patient Name:   [name] [facility] Date of Exam: 12/31/2021 Medical Rec #:  [id number]          Height:       67.0 in Accession #:    [id number]         Weight:       198.4 lb Date of Birth:  1946-02-18          BSA:          2.015 m Patient Age:    3 years           BP:           126/48 mmHg Patient Gender: F                  HR:  96 bpm. Exam Location:  Inpatient Procedure: 2D Echo, Cardiac Doppler and Color Doppler Indications:    R06.00 Dyspnea  History:        Patient has no prior history of Echocardiogram examinations.                 Risk Factors:Hypertension, Diabetes and Dyslipidemia. Hx of                 COVID-19.  Sonographer:    Alvino Chapel RCS Referring Phys: [place]  1. Left ventricular ejection fraction, by estimation, is 70 to 75%. The left ventricle has hyperdynamic function. The left ventricle has no regional wall motion abnormalities. Left ventricular diastolic parameters are consistent with Grade I diastolic dysfunction (impaired relaxation).  2. Right ventricular systolic function is normal. The right ventricular size is normal. Tricuspid regurgitation signal is inadequate for assessing PA pressure.  3. Left atrial size was severely dilated.  4. The mitral  valve is abnormal. Trivial mitral valve regurgitation.  5. The aortic valve is tricuspid. Aortic valve regurgitation is not visualized. Aortic valve sclerosis/calcification is present, without any evidence of aortic stenosis.  6. The inferior vena cava is dilated in size with >50% respiratory variability, suggesting right atrial pressure of 8 mmHg. Comparison(s): No prior Echocardiogram. FINDINGS  Left Ventricle: Left ventricular ejection fraction, by estimation, is 70 to 75%. The left ventricle has hyperdynamic function. The left ventricle has no regional wall motion abnormalities. The left ventricular internal cavity size was normal in size. There is no left ventricular hypertrophy. Left ventricular diastolic parameters are consistent with Grade I diastolic dysfunction (impaired relaxation). Indeterminate filling pressures. Right Ventricle: The right ventricular size is normal. No increase in right ventricular wall thickness. Right ventricular systolic function is normal. Tricuspid regurgitation signal is inadequate for assessing PA pressure. Left Atrium: Left atrial size was severely dilated. Right Atrium: Right atrial size was normal in size. Pericardium: There is no evidence of pericardial effusion. Mitral Valve: The mitral valve is abnormal. Mild mitral annular calcification. Trivial mitral valve regurgitation. Tricuspid Valve: The tricuspid valve is grossly normal. Tricuspid valve regurgitation is not demonstrated. Aortic Valve: The aortic valve is tricuspid. Aortic valve regurgitation is not visualized. Aortic valve sclerosis/calcification is present, without any evidence of aortic stenosis. Aortic valve mean gradient measures 9.5 mmHg. Aortic valve peak gradient measures 20.2 mmHg. Aortic valve area, by VTI measures 2.48 cm. Pulmonic Valve: The pulmonic valve was grossly normal. Pulmonic valve regurgitation is trivial. Aorta: The aortic root and ascending aorta are structurally normal, with no evidence of  dilitation. Venous: The inferior vena cava is dilated in size with greater than 50% respiratory variability, suggesting right atrial pressure of 8 mmHg. IAS/Shunts: No atrial level shunt detected by color flow Doppler.  LEFT VENTRICLE PLAX 2D LVIDd:         4.70 cm   Diastology LVIDs:         2.70 cm   LV e' medial:    7.83 cm/s LV PW:         1.00 cm   LV E/e' medial:  20.1 LV IVS:        1.00 cm   LV e' lateral:   10.00 cm/s LVOT diam:     1.90 cm   LV E/e' lateral: 15.7 LV SV:         104 LV SV Index:   52 LVOT Area:     2.84 cm  RIGHT VENTRICLE RV S prime:  17.60 cm/s TAPSE (M-mode): 2.3 cm LEFT ATRIUM              Index        RIGHT ATRIUM           Index LA diam:        3.20 cm  1.59 cm/m   RA Area:     17.30 cm LA Vol (A2C):   94.6 ml  46.94 ml/m  RA Volume:   44.90 ml  22.28 ml/m LA Vol (A4C):   110.0 ml 54.58 ml/m LA Biplane Vol: 113.0 ml 56.07 ml/m  AORTIC VALVE AV Area (Vmax):    2.08 cm AV Area (Vmean):   2.27 cm AV Area (VTI):     2.48 cm AV Vmax:           224.50 cm/s AV Vmean:          138.500 cm/s AV VTI:            0.421 m AV Peak Grad:      20.2 mmHg AV Mean Grad:      9.5 mmHg LVOT Vmax:         165.00 cm/s LVOT Vmean:        111.000 cm/s LVOT VTI:          0.368 m LVOT/AV VTI ratio: 0.87  AORTA Ao Root diam: 3.30 cm MITRAL VALVE MV Area (PHT): 3.08 cm     SHUNTS MV Decel Time: 246 msec     Systemic VTI:  0.37 m MV E velocity: 157.00 cm/s  Systemic Diam: 1.90 cm MV A velocity: 150.00 cm/s MV E/A ratio:  1.05 Lyman Bishop MD Electronically signed by Lyman Bishop MD Signature Date/Time: 12/31/2021/3:32:02 PM    Final    CT CHEST WO CONTRAST  Result Date: 12/30/2021 CLINICAL DATA:  Chronic dyspnea evaluate for possible malignancy EXAM: CT CHEST WITHOUT CONTRAST TECHNIQUE: Multidetector CT imaging of the chest was performed following the standard protocol without IV contrast. RADIATION DOSE REDUCTION: This exam was performed according to the departmental dose-optimization program  which includes automated exposure control, adjustment of the mA and/or kV according to patient size and/or use of iterative reconstruction technique. COMPARISON:  None Available. FINDINGS: Cardiovascular: Aortic atherosclerosis. Normal heart size. Left and right coronary artery calcifications. No pericardial effusion. Enlargement of the main pulmonary artery measuring 3.7 cm. Mediastinum/Nodes: No enlarged mediastinal, hilar, or axillary lymph nodes. Small hiatal hernia. Thyroid gland, trachea, and esophagus demonstrate no significant findings. Lungs/Pleura: Lungs are clear. No pleural effusion or pneumothorax. Upper Abdomen: No acute abnormality. Severely atrophic kidneys, partially imaged. Musculoskeletal: No chest wall abnormality. No suspicious osseous lesions identified. IMPRESSION: 1. No suspicious pulmonary nodules. No evidence of mass or lymphadenopathy in the chest. 2. No acute abnormality of the lungs. 3. Enlargement of the main pulmonary artery, as can be seen in pulmonary hypertension. 4. Coronary artery disease. Aortic Atherosclerosis (ICD10-I70.0). Electronically Signed   By: Delanna Ahmadi M.D.   On: 12/30/2021 15:26    Labs: BMET Recent Labs  Lab 01/11/2022 1039 01/10/2022 1537 01/12/2022 2052 01/07/2022 2346 12/30/21 0859 12/31/21 0241 01/01/22 0311  NA 131* 134* 138 136 141 142 138  K 5.4* 5.3* 5.1 5.0 4.8 5.0 4.9  CL 100  --  107 107 108 111 108  CO2 17*  --  18* 19* 18* 19* 16*  GLUCOSE 454*  --  306* 329* 243* 233* 249*  BUN 70*  --  70* 67* 58* 45* 37*  CREATININE  3.55*  --  3.16* 3.00* 2.68* 2.29* 2.40*  CALCIUM 10.6*  --  10.6* 10.5* 11.0* 11.2* 10.7*  PHOS  --   --  4.5 4.3 4.1 3.4 4.1    CBC Recent Labs  Lab 01/07/2022 1039 01/20/2022 1537 12/26/2021 2346 12/31/21 0241 01/01/22 0638  WBC 33.9*  --  27.3* 33.4* 37.9*  NEUTROABS 11.5*  --   --  5.6 16.3*  HGB 8.2* 10.2* 7.2* 7.2* 6.1*  HCT 23.9* 30.0* 20.8* 20.8* 17.9*  MCV 82.4  --  81.3 81.9 82.1  PLT 101*  --  91* 94*  75*     Medications:     sodium chloride   Intravenous Once   aspirin EC  81 mg Oral Daily   heparin injection (subcutaneous)  5,000 Units Subcutaneous Q8H   insulin aspart  0-15 Units Subcutaneous TID WC   insulin aspart  0-5 Units Subcutaneous QHS   insulin glargine-yfgn  35 Units Subcutaneous QHS   mycophenolate  500 mg Oral BID   pantoprazole  40 mg Oral Daily   pentoxifylline  400 mg Oral BID   tacrolimus  6 mg Oral BID    Jannifer Hick MD [facility] Pager [phone]

## 2022-01-01 NOTE — Progress Notes (Signed)
Subjective:   Summary: [name] is a 76 y.o. year old female currently admitted on the IMTS HD#2 for possible sepsis and acute on chronic renal failure.  Overnight Events: - Given 1 unit of blood in the morning following a.m. hemoglobin of 6.1 - Increased oxygen requirement now on 2 L nasal cannula with O2 sat of 95% on overnight - Paged about patient not taking p.o. intake and temperature up to 100 degrees this morning.  Upon assessment patient was disoriented and less interactive compared to yesterday.  Repeated urine labs and chest x-ray.  Upon returning to assess the patient about 2 hours later, she was more interactive but still appeared very fatigued  Objective:  Vital signs in last 24 hours: Vitals:   12/31/21 1956 12/31/21 2134 12/31/21 2222 01/01/22 0439  BP: (!) 168/54  (!) 158/58 (!) 130/49  Pulse:   (!) 104 (!) 102  Resp: (!) $RemoveB'22  20 20  'lVlWMxPx$ Temp:  (!) 100.8 F (38.2 C) 99.5 F (37.5 C) 99.9 F (37.7 C)  TempSrc:  Axillary Oral Oral  SpO2: 93%  97% 95%  Weight:    88.6 kg  Height:       Supplemental O2: Room Air SpO2: 95 % O2 Flow Rate (L/min): 2 L/min Rare desaturations at night down to 89% on room air.  Physical Exam:  Constitutional: elderly woman laying in bed, appears fatigued and uncomfortable.  Minimally interactive, but able to be aroused HEENT: Neck supple with no palpable lymphadenopathy CV: RRR.  2/6 systolic ejection murmur no LE edema Pulmonary: Lungs CTAB.  Increased respiratory effort  No wheezing.  No axillary lymphadenopathy noted Abdominal: Soft, tenderness to palpation in lower abdomen, nondistended Extremities: Radial pulses 2+ and symmetric. Faint DP pulses bilaterally. Skin: Warm and dry  Filed Weights   01/22/2022 1232 12/31/21 0300 01/01/22 0439  Weight: 100.3 kg 90 kg 88.6 kg     Intake/Output Summary (Last 24 hours) at 01/01/2022 0720 Last data filed at 01/01/2022 0159 Gross per 24 hour  Intake 2645.64 ml   Output 750 ml  Net 1895.64 ml    Net IO Since Admission: 5,486.66 mL [01/01/22 0720]  Pertinent Labs:    Latest Ref Rng & Units 01/01/2022    6:38 AM 12/31/2021    2:41 AM 01/16/2022   11:46 PM  CBC  WBC 4.0 - 10.5 K/uL 37.9  33.4  27.3   Hemoglobin 12.0 - 15.0 g/dL 6.1  7.2  7.2   Hematocrit 36.0 - 46.0 % 17.9  20.8  20.8   Platelets 150 - 400 K/uL 75  94  91   RDW 19.3, absolute lymphs 17.4, absolute monocytes 3.8, absolute neutrophils 16.3, plasmacytoid lymphs and target cells present     Latest Ref Rng & Units 01/01/2022    3:11 AM 12/31/2021    2:41 AM 12/30/2021    8:59 AM  CMP  Glucose 70 - 99 mg/dL 249  233  243   BUN 8 - 23 mg/dL 37  45  58   Creatinine 0.44 - 1.00 mg/dL 2.40  2.29  2.68   Sodium 135 - 145 mmol/L 138  142  141   Potassium 3.5 - 5.1 mmol/L 4.9  5.0  4.8   Chloride 98 - 111 mmol/L 108  111  108   CO2 22 - 32 mmol/L $RemoveB'16  19  18   'giOCxMmM$ Calcium 8.9 - 10.3  mg/dL 10.7  11.2  11.0   Last 3 CBGs 250, 236, 189 LDH 615 Urine sodium 50, urine creatinine 99.92, UA positive for protein, trace leukocytes, and rare bacteria Multiple myeloma panel, EBV ACR, calcitriol, PTH, PTH related peptide, tacrolimus level, flow cytometry pending   Imaging: Chest x-ray (01/01/2022): Showing marginal increase in pulmonary vascular congestion on the right and left basilar atelectasis  ECHOCARDIOGRAM COMPLETE  Result Date: 12/31/2021    ECHOCARDIOGRAM REPORT   Patient Name:   KEELEY SUSSMAN [facility] Date of Exam: 12/31/2021 Medical Rec #:  [id number]          Height:       67.0 in Accession #:    [id number]         Weight:       198.4 lb Date of Birth:  1946-05-24          BSA:          2.015 m Patient Age:    76 years           BP:           126/48 mmHg Patient Gender: F                  HR:           96 bpm. Exam Location:  Inpatient Procedure: 2D Echo, Cardiac Doppler and Color Doppler Indications:    R06.00 Dyspnea  History:        Patient has no prior history of Echocardiogram examinations.                  Risk Factors:Hypertension, Diabetes and Dyslipidemia. Hx of                 COVID-19.  Sonographer:    Alvino Chapel RCS Referring Phys: [place]  1. Left ventricular ejection fraction, by estimation, is 70 to 75%. The left ventricle has hyperdynamic function. The left ventricle has no regional wall motion abnormalities. Left ventricular diastolic parameters are consistent with Grade I diastolic dysfunction (impaired relaxation).  2. Right ventricular systolic function is normal. The right ventricular size is normal. Tricuspid regurgitation signal is inadequate for assessing PA pressure.  3. Left atrial size was severely dilated.  4. The mitral valve is abnormal. Trivial mitral valve regurgitation.  5. The aortic valve is tricuspid. Aortic valve regurgitation is not visualized. Aortic valve sclerosis/calcification is present, without any evidence of aortic stenosis.  6. The inferior vena cava is dilated in size with >50% respiratory variability, suggesting right atrial pressure of 8 mmHg. Comparison(s): No prior Echocardiogram. FINDINGS  Left Ventricle: Left ventricular ejection fraction, by estimation, is 70 to 75%. The left ventricle has hyperdynamic function. The left ventricle has no regional wall motion abnormalities. The left ventricular internal cavity size was normal in size. There is no left ventricular hypertrophy. Left ventricular diastolic parameters are consistent with Grade I diastolic dysfunction (impaired relaxation). Indeterminate filling pressures. Right Ventricle: The right ventricular size is normal. No increase in right ventricular wall thickness. Right ventricular systolic function is normal. Tricuspid regurgitation signal is inadequate for assessing PA pressure. Left Atrium: Left atrial size was severely dilated. Right Atrium: Right atrial size was normal in size. Pericardium: There is no evidence of pericardial effusion. Mitral Valve: The mitral valve is  abnormal. Mild mitral annular calcification. Trivial mitral valve regurgitation. Tricuspid Valve: The tricuspid valve is grossly normal. Tricuspid valve regurgitation is not demonstrated. Aortic Valve: The aortic valve is  tricuspid. Aortic valve regurgitation is not visualized. Aortic valve sclerosis/calcification is present, without any evidence of aortic stenosis. Aortic valve mean gradient measures 9.5 mmHg. Aortic valve peak gradient measures 20.2 mmHg. Aortic valve area, by VTI measures 2.48 cm. Pulmonic Valve: The pulmonic valve was grossly normal. Pulmonic valve regurgitation is trivial. Aorta: The aortic root and ascending aorta are structurally normal, with no evidence of dilitation. Venous: The inferior vena cava is dilated in size with greater than 50% respiratory variability, suggesting right atrial pressure of 8 mmHg. IAS/Shunts: No atrial level shunt detected by color flow Doppler.  LEFT VENTRICLE PLAX 2D LVIDd:         4.70 cm   Diastology LVIDs:         2.70 cm   LV e' medial:    7.83 cm/s LV PW:         1.00 cm   LV E/e' medial:  20.1 LV IVS:        1.00 cm   LV e' lateral:   10.00 cm/s LVOT diam:     1.90 cm   LV E/e' lateral: 15.7 LV SV:         104 LV SV Index:   52 LVOT Area:     2.84 cm  RIGHT VENTRICLE RV S prime:     17.60 cm/s TAPSE (M-mode): 2.3 cm LEFT ATRIUM              Index        RIGHT ATRIUM           Index LA diam:        3.20 cm  1.59 cm/m   RA Area:     17.30 cm LA Vol (A2C):   94.6 ml  46.94 ml/m  RA Volume:   44.90 ml  22.28 ml/m LA Vol (A4C):   110.0 ml 54.58 ml/m LA Biplane Vol: 113.0 ml 56.07 ml/m  AORTIC VALVE AV Area (Vmax):    2.08 cm AV Area (Vmean):   2.27 cm AV Area (VTI):     2.48 cm AV Vmax:           224.50 cm/s AV Vmean:          138.500 cm/s AV VTI:            0.421 m AV Peak Grad:      20.2 mmHg AV Mean Grad:      9.5 mmHg LVOT Vmax:         165.00 cm/s LVOT Vmean:        111.000 cm/s LVOT VTI:          0.368 m LVOT/AV VTI ratio: 0.87  AORTA Ao Root  diam: 3.30 cm MITRAL VALVE MV Area (PHT): 3.08 cm     SHUNTS MV Decel Time: 246 msec     Systemic VTI:  0.37 m MV E velocity: 157.00 cm/s  Systemic Diam: 1.90 cm MV A velocity: 150.00 cm/s MV E/A ratio:  1.05 Lyman Bishop MD Electronically signed by Lyman Bishop MD Signature Date/Time: 12/31/2021/3:32:02 PM    Final      Renal ultrasound (12/30/2021): No hydronephrosis, main renal artery resistive index in equivocal range, intrarenal resistive index is within normal limits, arterial and venous color-flow at the hilum  EKG: On continuous monitoring, consistently tachycardic, with PVCs  Assessment/Plan:   Principal Problem:   SIRS (systemic inflammatory response syndrome) (HCC) Active Problems:   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 with a pertinent PMH of type 2 diabetes with neuropathy, renal transplant in 2010, AAA status post iliac stent, hypothyroidism, osteoarthritis, HLD, and CKD 3, who presented with general aches and malaise and poor appetite and admitted for SIRS criteria and acute on chronic renal failure, now with concern for possible lymphoproliferative malignancy.   #SIRS #Leukocytosis #Generalized weakness # #Hypercalcemia Patient with a history of renal transplant on immunosuppressive medications found to have progressive generalized weakness and nausea/vomiting.  Antibiotics discontinued yesterday due to negative infectious work-up at that point.  Some concern for UTI versus pneumonia given altered mental status this morning, will reassess and cover possible UTI until return of urine studies.  CBC continues to be concerning for possible lymphoproliferative malignancy. We will follow-up on prior labs including flow cytometry to arrive at a diagnosis. -PT/OT recommending home health PT/OT -Follow-up flow cytometry -EBV PCR - Follow-up urine studies and chest x-ray   #AKI  on CKD 3 #AGMA #Hx of renal transplant #Hypercalcemia Creatinine today slight increase to 2.4 from 2.29 yesterday.  Patient initially improving with fluids, she is now showing signs of fluid overload including pulmonary edema. Renal ultrasound negative for hydronephrosis.  Patient continues to have persistently elevated calcium levels, now with concern for multiple myeloma. -Nephrology following, appreciate recs.  Recommended we restart tacrolimus 6 mg twice daily and CellCept 500 mg twice daily.  We will follow-up tacrolimus levels. -PTH, PTH related protein and 1-25 vitamin D -Multiple Myeloma panel -Strict I&O's - Lasix 40 mg once follow-up BMP   #Severe hyperglycemia #T2DM, uncontrolled #Diabetic neuropathy Repeat A1c with mild improvement to 9.7% but still elevated and above goal. Blood sugars remain persistently elevated on Semglee 35.  Patient received 10 units of short acting and 30 units of long-acting over the last 24 hours.  -Increase Semglee to home 38 units daily and continue sliding scale insulin   #Systolic murmur #Dyspnea on exertion Patient with no documented echo presented with fatigue and occasional dyspnea on exertion at home.  Patient not showing any symptoms or exam signs of heart failure but will formally assess.  Echo today showing LVEF 70 to 75% with associated hyperdynamic function of the left ventricle and severe dilation of the left atrium -Telemetry   #Normocytic anemia #Thrombocytopenia Hemoglobin this morning 6.1, received 1 unit of red blood cells, posttransfusion hemoglobin 7.5. Blood smear concerning for malignancy.  We will follow-up prior labs. -Trend CBC -Transfuse if hgb <7   #Iron overload #Elevated ferritin Iron studies showed iron level of 110, iron sat of 51%, TIBC low at 216 and ferritin severely elevated to 2000. On chart review, patient has had elevated ferritin since 2021 but that was as double in the last 2 years.  Elevated ferritin likely  due to possible lymphoproliferative malignancy.  -Malignancy and infectious work-up as above   #Hypothyroidism Patient with a history of hypothyroidism found to have severely low TSH on admission. Free T4 slightly elevated to 1.57, T3 today 87. Patient remains hemodynamically stable. No concern for thyrotoxicosis at the moment. Laboratory findings could likely be due to overtreatment. -CTM -Discontinued home Synthroid   #Abdominal aortic aneurysm #Aorto-iliac stent CT A/P today shows slight increase in aneurysmal sac to 7 cm from 5.7 cm on CT C/A/P on 08/02/2021.  Remained stable according to vascular surgery. -Continue pentoxifylline 400 mg twice daily   #Hypertension: Holding home amlodipine 10 mg daily #Hyperlipidemia: HDL <10, LDL 31 #Hyperkalemia, resolved #Hyponatremia,  resolved  Diet: Carb-Modified IVF: LR,125cc/hr VTE: Heparin Code: DNR PT/OT recs: Home Health, none. TOC recs:  Family Update: Updated her son and daughter on current diagnostic work-up and patient status   Dispo: Anticipated discharge pending diagnostic work-up currently concerning for malignancy  Linus Galas, MD PGY-1 Internal Medicine Resident Please contact the on call pager after 5 pm and on weekends at [phone].

## 2022-01-01 NOTE — Progress Notes (Signed)
Received called from lab at [place] with Hgb 6.1, on-call paged but still waiting for call back. Passes it on report to AM nurse. We continue to monitor.

## 2022-01-01 NOTE — Progress Notes (Signed)
PT Cancellation Note  Patient Details Name: [name] MRN: [id number] DOB: [birth year]   Cancelled Treatment:     Pt HGB critical at 6.1.  Pt receiving blood at this time.  Spoke w/ nurse to verify no treatment given.   Ladoris Gene 01/01/2022, 10:51 AM

## 2022-01-01 NOTE — Progress Notes (Addendum)
   01/01/22 1017  Assess: MEWS Score  Temp (!) 101.5 F (38.6 C)  BP (!) 131/52  Pulse Rate (!) 103  ECG Heart Rate (!) 103  Resp (!) 22  Level of Consciousness Responds to Voice  SpO2 93 %  O2 Device Nasal Cannula  O2 Flow Rate (L/min) 5 L/min  Assess: MEWS Score  MEWS Temp 2  MEWS Systolic 0  MEWS Pulse 1  MEWS RR 1  MEWS LOC 1  MEWS Score 5  MEWS Score Color Red  Assess: if the MEWS score is Yellow or Red  Were vital signs taken at a resting state? Yes  Focused Assessment Change from prior assessment (see assessment flowsheet)  Does the patient meet 2 or more of the SIRS criteria? Yes  Does the patient have a confirmed or suspected source of infection? Yes  Provider and Rapid Response Notified? Yes  MEWS guidelines implemented *See Row Information* No, previously red, continue vital signs every 4 hours  Treat  MEWS Interventions Escalated (See documentation below)  Pain Score Asleep  Take Vital Signs  Increase Vital Sign Frequency  Red: Q 1hr X 4 then Q 4hr X 4, if remains red, continue Q 4hrs  Escalate  MEWS: Escalate Red: discuss with charge nurse/RN and provider, consider discussing with RRT  Notify: Charge Nurse/RN  Name of Charge Nurse/RN Notified Civil engineer, contracting  Date Charge Nurse/RN Notified 01/01/22 (1000)  Time Charge Nurse/RN Notified 1000  Notify: Provider  Provider Name/Title Sriramkumar MD  Date Provider Notified 01/01/22  Time Provider Notified [phone]  Method of Notification Page  Notification Reason Change in status  Provider response At bedside  Date of Provider Response 01/01/22  Time of Provider Response 1000  Notify: Rapid Response  Name of Rapid Response RN Notified Saralyn Pilar RN  Date Rapid Response Notified 01/01/22  Time Rapid Response Notified 0321  Document  Patient Outcome Not stable and remains on department  Progress note created (see row info) Yes  Assess: SIRS CRITERIA  SIRS Temperature  1  SIRS Pulse 1  SIRS Respirations  1  SIRS WBC 1   SIRS Score Sum  4

## 2022-01-02 ENCOUNTER — Inpatient Hospital Stay (HOSPITAL_COMMUNITY): Payer: Medicare Other

## 2022-01-02 DIAGNOSIS — R6510 Systemic inflammatory response syndrome (SIRS) of non-infectious origin without acute organ dysfunction: Secondary | ICD-10-CM | POA: Diagnosis not present

## 2022-01-02 LAB — DIC (DISSEMINATED INTRAVASCULAR COAGULATION)PANEL
D-Dimer, Quant: 2.34 ug/mL-FEU — ABNORMAL HIGH (ref 0.00–0.50)
Fibrinogen: 299 mg/dL (ref 210–475)
INR: 1.6 — ABNORMAL HIGH (ref 0.8–1.2)
Platelets: 72 10*3/uL — ABNORMAL LOW (ref 150–400)
Prothrombin Time: 18.9 seconds — ABNORMAL HIGH (ref 11.4–15.2)
Smear Review: NONE SEEN
aPTT: 30 seconds (ref 24–36)

## 2022-01-02 LAB — SAVE SMEAR(SSMR), FOR PROVIDER SLIDE REVIEW

## 2022-01-02 LAB — TYPE AND SCREEN
ABO/RH(D): A POS
Antibody Screen: NEGATIVE
Unit division: 0

## 2022-01-02 LAB — GLUCOSE, CAPILLARY
Glucose-Capillary: 212 mg/dL — ABNORMAL HIGH (ref 70–99)
Glucose-Capillary: 221 mg/dL — ABNORMAL HIGH (ref 70–99)
Glucose-Capillary: 227 mg/dL — ABNORMAL HIGH (ref 70–99)
Glucose-Capillary: 228 mg/dL — ABNORMAL HIGH (ref 70–99)
Glucose-Capillary: 247 mg/dL — ABNORMAL HIGH (ref 70–99)

## 2022-01-02 LAB — URINE CULTURE: Culture: NO GROWTH

## 2022-01-02 LAB — CBC
HCT: 22.9 % — ABNORMAL LOW (ref 36.0–46.0)
HCT: 22.2 % — ABNORMAL LOW (ref 36.0–46.0)
Hemoglobin: 7.7 g/dL — ABNORMAL LOW (ref 12.0–15.0)
Hemoglobin: 7.6 g/dL — ABNORMAL LOW (ref 12.0–15.0)
MCH: 28.2 pg (ref 26.0–34.0)
MCH: 28 pg (ref 26.0–34.0)
MCHC: 33.6 g/dL (ref 30.0–36.0)
MCHC: 34.2 g/dL (ref 30.0–36.0)
MCV: 83.9 fL (ref 80.0–100.0)
MCV: 81.9 fL (ref 80.0–100.0)
Platelets: 37 10*3/uL — ABNORMAL LOW (ref 150–400)
Platelets: 77 10*3/uL — ABNORMAL LOW (ref 150–400)
RBC: 2.73 MIL/uL — ABNORMAL LOW (ref 3.87–5.11)
RBC: 2.71 MIL/uL — ABNORMAL LOW (ref 3.87–5.11)
RDW: 19.1 % — ABNORMAL HIGH (ref 11.5–15.5)
RDW: 19.4 % — ABNORMAL HIGH (ref 11.5–15.5)
WBC: 32.3 10*3/uL — ABNORMAL HIGH (ref 4.0–10.5)
WBC: 39.3 10*3/uL — ABNORMAL HIGH (ref 4.0–10.5)
nRBC: 0.5 % — ABNORMAL HIGH (ref 0.0–0.2)
nRBC: 0.7 % — ABNORMAL HIGH (ref 0.0–0.2)

## 2022-01-02 LAB — HEMOGLOBIN AND HEMATOCRIT, BLOOD
HCT: 23.4 % — ABNORMAL LOW (ref 36.0–46.0)
HCT: 23.2 % — ABNORMAL LOW (ref 36.0–46.0)
Hemoglobin: 8.1 g/dL — ABNORMAL LOW (ref 12.0–15.0)
Hemoglobin: 8 g/dL — ABNORMAL LOW (ref 12.0–15.0)

## 2022-01-02 LAB — RENAL FUNCTION PANEL
Albumin: 3.4 g/dL — ABNORMAL LOW (ref 3.5–5.0)
Anion gap: 16 — ABNORMAL HIGH (ref 5–15)
BUN: 42 mg/dL — ABNORMAL HIGH (ref 8–23)
CO2: 17 mmol/L — ABNORMAL LOW (ref 22–32)
Calcium: 10.9 mg/dL — ABNORMAL HIGH (ref 8.9–10.3)
Chloride: 110 mmol/L (ref 98–111)
Creatinine, Ser: 2.27 mg/dL — ABNORMAL HIGH (ref 0.44–1.00)
GFR, Estimated: 22 mL/min — ABNORMAL LOW (ref >60)
Glucose, Bld: 257 mg/dL — ABNORMAL HIGH (ref 70–99)
Phosphorus: 4.1 mg/dL (ref 2.5–4.6)
Potassium: 5.1 mmol/L (ref 3.5–5.1)
Sodium: 143 mmol/L (ref 135–145)

## 2022-01-02 LAB — DIFFERENTIAL
Abs Immature Granulocytes: 0.51 10*3/uL — ABNORMAL HIGH (ref 0.00–0.07)
Basophils Absolute: 0 10*3/uL (ref 0.0–0.1)
Basophils Relative: 0 %
Eosinophils Absolute: 0 10*3/uL (ref 0.0–0.5)
Eosinophils Relative: 0 %
Immature Granulocytes: 2 %
Lymphocytes Relative: 40 %
Lymphs Abs: 12.8 10*3/uL — ABNORMAL HIGH (ref 0.7–4.0)
Monocytes Absolute: 13.3 10*3/uL — ABNORMAL HIGH (ref 0.1–1.0)
Monocytes Relative: 41 %
Neutro Abs: 5.3 10*3/uL (ref 1.7–7.7)
Neutrophils Relative %: 17 %
Smear Review: NORMAL
WBC Morphology: ABNORMAL

## 2022-01-02 LAB — BLOOD GAS, ARTERIAL
Acid-base deficit: 0.5 mmol/L (ref 0.0–2.0)
Bicarbonate: 24.2 mmol/L (ref 20.0–28.0)
Drawn by: 59094
O2 Saturation: 95.5 %
Patient temperature: 37.9
pCO2 arterial: 41 mmHg (ref 32–48)
pH, Arterial: 7.39 (ref 7.35–7.45)
pO2, Arterial: 74 mmHg — ABNORMAL LOW (ref 83–108)

## 2022-01-02 LAB — EPSTEIN BARR VRS(EBV DNA BY PCR): EBV DNA QN by PCR: NEGATIVE IU/mL

## 2022-01-02 LAB — SURGICAL PATHOLOGY

## 2022-01-02 LAB — TECHNOLOGIST SMEAR REVIEW
Plt Morphology: NORMAL
WBC MORPHOLOGY: ABNORMAL

## 2022-01-02 LAB — BPAM RBC
Blood Product Expiration Date: 202307262359
ISSUE DATE / TIME: 202307100945
Unit Type and Rh: 6200

## 2022-01-02 LAB — TACROLIMUS LEVEL: Tacrolimus (FK506) - LabCorp: 5.6 ng/mL (ref 2.0–20.0)

## 2022-01-02 LAB — TROPONIN I (HIGH SENSITIVITY)
Troponin I (High Sensitivity): 105 ng/L (ref <18)
Troponin I (High Sensitivity): 117 ng/L (ref <18)
Troponin I (High Sensitivity): 134 ng/L (ref <18)
Troponin I (High Sensitivity): 131 ng/L (ref <18)

## 2022-01-02 LAB — HEPARIN LEVEL (UNFRACTIONATED): Heparin Unfractionated: 0.54 IU/mL (ref 0.30–0.70)

## 2022-01-02 LAB — LACTIC ACID, PLASMA
Lactic Acid, Venous: 2 mmol/L (ref 0.5–1.9)
Lactic Acid, Venous: 2.4 mmol/L (ref 0.5–1.9)

## 2022-01-02 LAB — MAGNESIUM: Magnesium: 1.7 mg/dL (ref 1.7–2.4)

## 2022-01-02 MED ORDER — VANCOMYCIN HCL 500 MG/100ML IV SOLN
500.0000 mg | INTRAVENOUS | Status: DC
Start: 2022-01-03 — End: 2022-01-03
  Administered 2022-01-03: 500 mg via INTRAVENOUS
  Filled 2022-01-02: qty 100

## 2022-01-02 MED ORDER — VANCOMYCIN HCL 1500 MG/300ML IV SOLN
1500.0000 mg | Freq: Once | INTRAVENOUS | Status: AC
  Administered 2022-01-02: 1500 mg via INTRAVENOUS
  Filled 2022-01-02: qty 300

## 2022-01-02 MED ORDER — AMIODARONE HCL IN DEXTROSE 360-4.14 MG/200ML-% IV SOLN
60.0000 mg/h | INTRAVENOUS | Status: AC
  Administered 2022-01-02: 60 mg/h via INTRAVENOUS
  Filled 2022-01-02: qty 200

## 2022-01-02 MED ORDER — DILTIAZEM HCL-DEXTROSE 125-5 MG/125ML-% IV SOLN (PREMIX)
5.0000 mg/h | INTRAVENOUS | Status: DC
  Administered 2022-01-02: 5 mg/h via INTRAVENOUS
  Administered 2022-01-02: 15 mg/h via INTRAVENOUS
  Filled 2022-01-02 (×3): qty 125

## 2022-01-02 MED ORDER — FUROSEMIDE 10 MG/ML IJ SOLN
40.0000 mg | Freq: Once | INTRAMUSCULAR | Status: AC
  Administered 2022-01-02: 40 mg via INTRAVENOUS
  Filled 2022-01-02: qty 4

## 2022-01-02 MED ORDER — CALCITONIN (SALMON) 200 UNIT/ML IJ SOLN: 4.0000 [IU]/kg | Freq: Two times a day (BID) | INTRAMUSCULAR | Status: DC

## 2022-01-02 MED ORDER — MAGNESIUM SULFATE 2 GM/50ML IV SOLN
2.0000 g | Freq: Once | INTRAVENOUS | Status: AC
  Administered 2022-01-02: 2 g via INTRAVENOUS
  Filled 2022-01-02: qty 50

## 2022-01-02 MED ORDER — METOPROLOL TARTRATE 5 MG/5ML IV SOLN
5.0000 mg | INTRAVENOUS | Status: DC | PRN
  Administered 2022-01-02 (×2): 5 mg via INTRAVENOUS
  Filled 2022-01-02 (×2): qty 5

## 2022-01-02 MED ORDER — METOPROLOL TARTRATE 25 MG PO TABS: 25.0000 mg | ORAL_TABLET | Freq: Once | ORAL | Status: DC

## 2022-01-02 MED ORDER — METOPROLOL TARTRATE 5 MG/5ML IV SOLN: 5.0000 mg | INTRAVENOUS | Status: DC | PRN

## 2022-01-02 MED ORDER — INSULIN GLARGINE-YFGN 100 UNIT/ML ~~LOC~~ SOLN
40.0000 [IU] | Freq: Every day | SUBCUTANEOUS | Status: DC
  Administered 2022-01-02: 40 [IU] via SUBCUTANEOUS
  Filled 2022-01-02 (×2): qty 0.4

## 2022-01-02 MED ORDER — HEPARIN (PORCINE) 25000 UT/250ML-% IV SOLN
800.0000 [IU]/h | INTRAVENOUS | Status: DC
  Administered 2022-01-02: 950 [IU]/h via INTRAVENOUS
  Filled 2022-01-02: qty 250

## 2022-01-02 MED ORDER — TECHNETIUM TO 99M ALBUMIN AGGREGATED
4.3000 | Freq: Once | INTRAVENOUS | Status: AC | PRN
Start: 2022-01-02 — End: 2022-01-02
  Administered 2022-01-02: 4.3 via INTRAVENOUS

## 2022-01-02 MED ORDER — METOPROLOL TARTRATE 5 MG/5ML IV SOLN
5.0000 mg | INTRAVENOUS | Status: DC | PRN
  Administered 2022-01-02: 5 mg via INTRAVENOUS
  Filled 2022-01-02: qty 5

## 2022-01-02 MED ORDER — AMIODARONE LOAD VIA INFUSION
150.0000 mg | Freq: Once | INTRAVENOUS | Status: AC
  Administered 2022-01-02: 150 mg via INTRAVENOUS
  Filled 2022-01-02: qty 83.34

## 2022-01-02 MED ORDER — AMIODARONE HCL IN DEXTROSE 360-4.14 MG/200ML-% IV SOLN
30.0000 mg/h | INTRAVENOUS | Status: DC
  Administered 2022-01-02 (×2): 30 mg/h via INTRAVENOUS
  Filled 2022-01-02 (×2): qty 200

## 2022-01-02 NOTE — Progress Notes (Addendum)
[place] for Heparin Indication: atrial fibrillation and concern for PE  Allergies  Allergen Reactions   Hectorol [Doxercalciferol] Anaphylaxis and Shortness Of Breath   Latex Anaphylaxis and Other (See Comments)    discharge   Other Rash    Thin toilet tissue and medical paper table covering causes rash.   Vitamin D Analogs Anaphylaxis and Shortness Of Breath    Could not catch breath and had bad reaction   Coumadin [Warfarin Sodium] Other (See Comments)    Hemorhage. Has IVC filter   Lisinopril Cough    Patient Measurements: Height: '5\' 7"'$  (170.2 cm) Weight: 94.2 kg (207 lb 10.8 oz) IBW/kg (Calculated) : 61.6 Heparin Dosing Weight: 82.7 kg  Vital Signs: Temp: 100.9 F (38.3 C) (07/11 1700) Temp Source: Oral (07/11 1700) BP: 139/58 (07/11 1700) Pulse Rate: 124 (07/11 1716)  Labs: Recent Labs    12/31/21 0241 01/01/22 0311 01/01/22 7628 01/01/22 1354 01/02/22 0241 01/02/22 0410 01/02/22 0643 01/02/22 0838 01/02/22 1059 01/02/22 1947  HGB 7.2*  --  6.1*   < > 7.7*  --   --  8.1* 8.0*  --   HCT 20.8*  --  17.9*   < > 22.9*  --   --  23.4* 23.2*  --   PLT 94*  --  75*  --  37* 72*  --   --   --   --   APTT  --   --   --   --   --  30  --   --   --   --   LABPROT  --   --   --   --   --  18.9*  --   --   --   --   INR  --   --   --   --   --  1.6*  --   --   --   --   HEPARINUNFRC  --   --   --   --   --   --   --   --   --  0.54  CREATININE 2.29* 2.40*  --   --  2.27*  --   --   --   --   --   TROPONINIHS  --   --   --    < > 105* 117* 134* 131*  --   --    < > = values in this interval not displayed.     Estimated Creatinine Clearance: 25.6 mL/min (A) (by C-G formula based on SCr of 2.27 mg/dL (H)).  Assessment:  76 yr old female to begin IV heparin for atrial fibrillation and concern for PE.   Initial heparin level 0.54 Platelet count at 77 K this evening  Goal of Therapy:  Heparin level 0.3-0.5 units/ml Monitor  platelets by anticoagulation protocol: Yes   Plan:  Decrease heparin to 800 units/hr Follow up AM labs / heparin plan   Thank you Anette Guarneri, PharmD   01/02/2022,8:36 PM

## 2022-01-02 NOTE — Progress Notes (Addendum)
Subjective:   Summary: ROSALYNN SERGENT is a 76 y.o. year old female currently admitted on the IMTS HD#3 for possible sepsis and acute on chronic renal failure.  Overnight Events: - Paged multiple times overnight for worsening altered mental status.  Patient periodically opens her eyes spontaneously, responds to voice, nods her head to questions, overall decreased interactivity over past few days as per nurse and family.  Febrile up to 102.2 and received Tylenol with improvement down to 100.  Of note, her nasal cannula fell out of place overnight and subsequent ABG showed oxygen 74.  Chest x-ray at that time showed slight improvement compared to prior.  Paged again at approximately 0200 and patient was found to have A-fib, stat EKG showed atrial flutter repeat labs and stat tropes ordered.  Trops elevated at about 100 but not rising.  She was then started on rate control with diltiazem.  Due to worsening thrombocytopenia we also obtain DIC panel which does not look concerning, and restarted vancomycin.  Finally upon reassessment crackles in bilateral lung bases and gave another Lasix 40 mg.  See overnight note from Dr. Elliot Gurney for full details - Started on amiodarone drip this morning, still in RVR.  Cardiology recommended IV metoprolol and possible cardioversion.  Discussed this with family and they are amenable to cardioversion.  They would like [place] to do everything possible to treat this [place] while in the hospital, and they are open to discussions with palliative care for long-term planning in the future.  Objective:  Vital signs in last 24 hours: Vitals:   01/02/22 0433 01/02/22 0507 01/02/22 0510 01/02/22 0603  BP:   (!) 115/58 (!) 110/58  Pulse: (!) 169 (!) 159 (!) 157 (!) 160  Resp: (!) 29 (!) 21  20  Temp:      TempSrc:      SpO2: 94% 96% 95% 94%  Weight:      Height:       Supplemental O2: Room Air SpO2: 94 % O2 Flow Rate (L/min): 14 L/min FiO2 (%): 55  % Oxygen saturation ranging from 90-95 5 on 6 L nasal cannula.  Patient swapped to Venturi mask on 55 FiO2, 14 L oxygen after desaturation event overnight.  Physical Exam:  Constitutional: elderly woman laying in bed, appears fatigued and uncomfortable with Venturi mask on.  Somnolent and difficult to arouse HEENT: Neck supple with no palpable lymphadenopathy.  No meningismus noted. CV: Tachycardic, regular.  2/6 systolic ejection murmur, no LE edema Pulmonary: Lungs CTAB.  Increased respiratory effort with Venturi mask on.  No wheezing.  No axillary lymphadenopathy noted Abdominal: Soft, tenderness to palpation in lower abdomen, nondistended Extremities: Radial pulses 2+ and symmetric. Faint DP pulses bilaterally.  SCDs on Skin: Warm and dry, multiple sites of ecchymosis and purpura on abdomen, upper extremities, and lower extremities bilaterally  Filed Weights   12/31/21 0300 01/01/22 0439 01/02/22 0408  Weight: 90 kg 88.6 kg 94.2 kg     Intake/Output Summary (Last 24 hours) at 01/02/2022 0620 Last data filed at 01/02/2022 0512 Gross per 24 hour  Intake 621.14 ml  Output 1950 ml  Net -1328.86 ml    Net IO Since Admission: 4,157.8 mL [01/02/22 0620]  Pertinent Labs:    Latest Ref Rng & Units 01/02/2022    4:10 AM 01/02/2022    2:41 AM 01/01/2022    1:54 PM  CBC  WBC 4.0 -  10.5 K/uL  32.3    Hemoglobin 12.0 - 15.0 g/dL  7.7  7.5   Hematocrit 36.0 - 46.0 %  22.9  22.1   Platelets 150 - 400 K/uL 72  37    Differential showing absolute lymphs 12.8, absolute monocytes 13.3, absolute immature granulocytes 0.51     Latest Ref Rng & Units 01/02/2022    2:41 AM 01/01/2022    3:11 AM 12/31/2021    2:41 AM  CMP  Glucose 70 - 99 mg/dL 257  249  233   BUN 8 - 23 mg/dL 42  37  45   Creatinine 0.44 - 1.00 mg/dL 2.27  2.40  2.29   Sodium 135 - 145 mmol/L 143  138  142   Potassium 3.5 - 5.1 mmol/L 5.1  4.9  5.0   Chloride 98 - 111 mmol/L 110  108  111   CO2 22 - 32 mmol/L '17  16  19    ' Calcium 8.9 - 10.3 mg/dL 10.9  10.7  11.2   Last 3 CBGs 247, 228, 218 Tacrolimus, PTH level within normal limits EBV negative Urine culture showing no growth Multiple myeloma panel, PTH related peptide, flow cytometry pending ABG: O2 74 Mag 1.7 Troponins 134, 117, 105 Lactic acid 2.0 DIC panel: PT 18.9, INR 1.6, D-dimer 2.34, fibrinogen 299  Imaging:  DG Chest Port 1 View  Result Date: 01/02/2022 CLINICAL DATA:  Acute respiratory distress EXAM: PORTABLE CHEST 1 VIEW COMPARISON:  01/01/2022, 12/30/2021 FINDINGS: Mild cardiomegaly with vascular congestion and mild interstitial edema. Small bilateral effusions. Basilar consolidation on the left. Findings do not appear significantly changed. IMPRESSION: 1. Cardiomegaly with vascular congestion, mild interstitial edema and small pleural effusions 2. Consolidation at the left lung base may be due to atelectasis or pneumonia. Electronically Signed   By: Donavan Foil M.D.   On: 01/02/2022 01:44   DG CHEST PORT 1 VIEW  Result Date: 01/01/2022 CLINICAL DATA:  200811 decreased pulse ox EXAM: PORTABLE CHEST 1 VIEW COMPARISON:  December 29, 2021 FINDINGS: Heart is borderline. Minor atelectasis at the left lung base. Central pulmonary vascular congestion and is more prominent in the present study. The visualized skeletal structures are unremarkable. IMPRESSION: Heart is borderline. Central pulmonary vascular congestion. Left basilar minor atelectasis. Electronically Signed   By: Frazier Richards M.D.   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 with a pertinent PMH of type 2 diabetes with neuropathy, renal transplant in 2010, AAA status post iliac stent, hypothyroidism, osteoarthritis, HLD, and CKD 3, who presented with general aches and malaise and poor appetite and admitted for SIRS criteria and acute on chronic renal failure, now with concern for possible lymphoproliferative malignancy and new onset A-fib with RVR.  #SIRS #Leukocytosis #Generalized weakness #Hypercalcemia Patient on IV ceftriaxone from yesterday, restarted on vancomycin overnight due to altered mental status and repeated fever.  CBC continues to be concerning for possible lymphoproliferative malignancy. We will follow-up on prior labs including flow cytometry to  arrive at a diagnosis.  EBV PCR negative today.  Chest x-ray unlikely to represent pneumonia.  Urine culture showing no growth -Follow-up flow cytometry -Continue vancomycin and IV ceftriaxone while still evaluating for possible sepsis.  Follow-up on blood cultures.   #AKI on CKD 3 #AGMA #Hx of renal transplant #Hypercalcemia Creatinine today slightly improved at 2.27 from 2.4 yesterday.  Patient initially improving with fluids, she is now showing signs of fluid overload including pulmonary edema. Renal ultrasound negative for hydronephrosis. Patient continues to have persistently elevated calcium levels, now with concern for multiple myeloma.  Tacrolimus level returned within normal limits.  PTH within normal limits. -Nephrology following, appreciate recs.  Recommended we restart tacrolimus 6 mg twice daily and CellCept 500 mg twice daily.  We will follow-up tacrolimus levels. -PTH related protein -Multiple Myeloma panel -Strict I&O's   #New onset A-fib with RVR Patient entered A-fib last night after desaturation event.  Still in RVR with rates in the 160s with diltiazem drip and  amiodarone drip.  Cardiology on board recommended IV metoprolol and possible cardioversion. - Start IV metoprolol 5 mg every 5 minutes until rate decreases. Transition to IV Metoprolol 31m q4H PRN for rate control. - Continue amiodarone drip. Consider cardioversion if she has not converted by tomorrow a.m. - Could further consider adenosine, verapamil  #Severe hyperglycemia #T2DM, uncontrolled #Diabetic neuropathy Repeat A1c with mild improvement to 9.7% but still elevated and above goal. Blood sugars remain persistently elevated on Semglee 35.  Patient received 10 units of short acting and 30 units of long-acting over the last 24 hours.  -Increase Semglee to home 38 units daily and continue sliding scale insulin   #Normocytic anemia #Thrombocytopenia #Concern for TTP Hemoglobin today stable. Blood smear concerning for lymphoproliferative malignancy.  We will follow-up prior labs.  Altered mental status and fever today also raises concern for TTP.  We will continue to monitor her platelets and evaluate if suspicion increases. -Trend CBC -Transfuse if hgb <7  #Systolic murmur #Dyspnea on exertion Patient with no documented echo presented with fatigue and occasional dyspnea on exertion at home.  Patient not showing any symptoms or exam signs of heart failure but will formally assess.  Echo today showing LVEF 70 to 75% with associated hyperdynamic function of the left ventricle and severe dilation of the left atrium -Telemetry   #Iron overload #Elevated ferritin Iron studies showed iron level of 110, iron sat of 51%, TIBC low at 216 and ferritin severely elevated to 2000. On chart review, patient has had elevated ferritin since 2021 but that was as double in the last 2 years.  Elevated ferritin likely due to possible lymphoproliferative malignancy.  -Malignancy and infectious work-up as above   #Hypothyroidism Patient with a history of hypothyroidism found to have severely low TSH on  admission. Free T4 slightly elevated to 1.57, T3 today 87. -CTM -Discontinued home Synthroid   #Abdominal aortic aneurysm #Aorto-iliac stent CT A/P today shows slight increase in aneurysmal sac to 7 cm from 5.7 cm on CT C/A/P on 08/02/2021.  Remained stable according to vascular surgery. -Continue pentoxifylline 400 mg twice daily   #Hypertension: Holding home amlodipine 10 mg daily #Hyperlipidemia: HDL <10, LDL 31 #Hyperkalemia, resolved #Hyponatremia, resolved  Diet: Carb-Modified IVF: Holding fluids. VTE: Heparin Code: DNR PT/OT recs: Pending PT/OT reevaluation once she is stable TOC recs:  Family Update: Updated her son and daughter on current diagnostic work-up and patient status.   Dispo: Anticipated discharge pending stability/conversion of A-fib to normal sinus  and diagnostic work-up currently concerning for malignancy  Linus Galas, MD PGY-1 Internal Medicine Resident Please contact the on call pager after 5 pm and on weekends at [phone].

## 2022-01-02 NOTE — Progress Notes (Signed)
ANTICOAGULATION CONSULT NOTE - Initial Consult  Pharmacy Consult for Heparin Indication: atrial fibrillation and concern for PE  Allergies  Allergen Reactions   Hectorol [Doxercalciferol] Anaphylaxis and Shortness Of Breath   Latex Anaphylaxis and Other (See Comments)    discharge   Other Rash    Thin toilet tissue and medical paper table covering causes rash.   Vitamin D Analogs Anaphylaxis and Shortness Of Breath    Could not catch breath and had bad reaction   Coumadin [Warfarin Sodium] Other (See Comments)    Hemorhage. Has IVC filter   Lisinopril Cough    Patient Measurements: Height: '5\' 7"'$  (170.2 cm) Weight: 94.2 kg (207 lb 10.8 oz) IBW/kg (Calculated) : 61.6 Heparin Dosing Weight: 82.7 kg  Vital Signs: Temp: 102.2 F (39 C) (07/11 0700) Temp Source: Axillary (07/11 0700) BP: 94/57 (07/11 0700) Pulse Rate: 166 (07/11 0830)  Labs: Recent Labs    12/31/21 0241 01/01/22 0311 01/01/22 7253 01/01/22 1354 01/02/22 0241 01/02/22 0410 01/02/22 0643 01/02/22 0838  HGB 7.2*  --  6.1* 7.5* 7.7*  --   --  8.1*  HCT 20.8*  --  17.9* 22.1* 22.9*  --   --  23.4*  PLT 94*  --  75*  --  37* 72*  --   --   APTT  --   --   --   --   --  30  --   --   LABPROT  --   --   --   --   --  18.9*  --   --   INR  --   --   --   --   --  1.6*  --   --   CREATININE 2.29* 2.40*  --   --  2.27*  --   --   --   TROPONINIHS  --   --   --   --  105* 117* 134*  --     Estimated Creatinine Clearance: 25.6 mL/min (A) (by C-G formula based on SCr of 2.27 mg/dL (H)).   Medical History: Past Medical History:  Diagnosis Date   AAA (abdominal aortic aneurysm) (HCC)    3.8 cm Infrarenal managed at [facility]   Clotting disorder [facility])    PE, IVC filter placed at [facility]   Diabetes mellitus    Hypertension    Hypothyroidism    Morbid obesity ([place])    BMI 34   Renal transplant recipient    [facility]   Assessment:  76 yr old female to begin IV heparin for atrial fibrillation and concern  for PE.   SQ heparin for VTE prophylaxis was stopped on 7/10.  Low platelet count, Hgb low but improved after 1 unit PRBCs given on 7/10.  Discussed with Dr. Jodell Cipro. Will target low therapeutic heparin levels and avoid boluses.  Monitor platelet count for significant drop and possible need to hold heparin.  IR consulted for possible LP.    Goal of Therapy:  Heparin level 0.3-0.5 units/ml Monitor platelets by anticoagulation protocol: Yes   Plan:  Heparin drip to begin at 950 units/hr Heparin level and CBC ~8 hrs after drip begins. Daily heparin level and CBC while on heparin. Monitor low platelet count for any need to hold IV heparin. Follow up IR consult for possible LP and need to hold IV heparin. Monitor for signs/symptoms of bleeding.  Arty Baumgartner, [place] 01/02/2022,9:16 AM

## 2022-01-02 NOTE — Progress Notes (Signed)
Patient's home meds found in lost and found. Medications given to daughter to take home.

## 2022-01-02 NOTE — Progress Notes (Signed)
Pharmacy Antibiotic Note  [name] is a 76 y.o. female admitted on 01/15/2022 with sepsis.  Pharmacy has been consulted for vancomycin dosing. Renal transplant in 2010, AKI resolving, Tmax 1.04, WBC 37.9,   Plan: Vancomycin '1500mg'$  IV x1 then '500mg'$  IV Q24 hours estimated AUC 535  Height: '5\' 7"'$  (170.2 cm) Weight: 88.6 kg (195 lb 5.2 oz) (Patient unable to stand very sleepy NG NT/NS) IBW/kg (Calculated) : 61.6  Temp (24hrs), Avg:100.6 F (38.1 C), Min:99.1 F (37.3 C), Max:102.2 F (39 C)  Recent Labs  Lab 01/21/2022 1039 01/03/2022 1411 01/01/2022 1630 01/06/2022 2052 12/30/2021 2346 12/30/21 0859 12/31/21 0241 01/01/22 0311 01/01/22 0638  WBC 33.9*  --   --   --  27.3*  --  33.4*  --  37.9*  CREATININE 3.55*  --   --  3.16* 3.00* 2.68* 2.29* 2.40*  --   LATICACIDVEN  --  2.3* 1.9  --   --   --   --   --   --     Estimated Creatinine Clearance: 23.5 mL/min (A) (by C-G formula based on SCr of 2.4 mg/dL (H)).    Allergies  Allergen Reactions   Hectorol [Doxercalciferol] Anaphylaxis and Shortness Of Breath   Latex Anaphylaxis and Other (See Comments)    discharge   Other Rash    Thin toilet tissue and medical paper table covering causes rash.   Vitamin D Analogs Anaphylaxis and Shortness Of Breath    Could not catch breath and had bad reaction   Coumadin [Warfarin Sodium] Other (See Comments)    Hemorhage. Has IVC filter   Lisinopril Cough    Antimicrobials this admission: 7/11 vancomycin >>   Microbiology results: pending  Georga Bora, PharmD Clinical Pharmacist 01/02/2022 2:24 AM Please check AMION for all [place] numbers

## 2022-01-02 NOTE — TOC Progression Note (Signed)
Transition of Care [facility]) - Progression Note    Patient Details  Name: [name] MRN: [id number] Date of Birth: 12/14/1945  [facility] CM/SW Contact  Zenon Mayo, RN Phone Number: 01/02/2022, 4:23 PM  Clinical Narrative:    SIRS; received blood due to low hg. Aflutter and AV Block; on cardizem and amio drips. May need pulmonary cath. Bed bound and not oriented  From home with spouse, TOC following.  Will need for pt to re eval when improved.        Expected Discharge Plan and Services                                                 Social Determinants of Health (SDOH) Interventions    Readmission Risk Interventions     No data to display

## 2022-01-02 NOTE — Progress Notes (Signed)
OT Cancellation Note  Patient Details Name: [name] MRN: [id number] DOB: 1945-10-17   Cancelled Treatment:    Reason Eval/Treat Not Completed: Patient's level of consciousness (Patient very lethargic. Nursing recommends withholding therapy) Lodema Hong, [place]  Office [place] 01/02/2022, 12:14 PM

## 2022-01-02 NOTE — Progress Notes (Signed)
Per daughter and son request to minimize pt's turning and movement. Stated that is painful for pt when she is turned or cleaned.  Nurse will cluster patient care and continue to monitor.

## 2022-01-02 NOTE — Progress Notes (Addendum)
Paged by RN approximately 1:00AM  Per RN, pt appearing to have AMS, lethargy. She would respond to painful sternal rub, but not verbal or tactile stimuli. Patient would not open eyes. RRT was called due to AMS.  Per nurse, while patient was being changed earlier in night, her [place] fell out and she desatted. [place] was replaced with with improvement in O2 sat. This occurred just before apparent AMS. She has been febrile up to 102.2, received tylenol with some improvement.  At bedside, daughter reports progressive decline over past several days. She notes that patient would open her eyes, but responded very little and was not very interactive around 10am earlier today.   Patient does respond intermittently to voice. She periodically opens eyes. Nods her head to questioning. Denies pain.  Nurse reported patient interaction during interview was best response she had seen throughout shift.   PE: Appears lethargic, resting in bed.  Tachycardic, murmur noted on exam. Extremities warm and well perfused. Lungs CTAB, 95% on 3L.  ABD soft, NT, +BS Moves all extremities spontaneously. No edema.  ABG showed O2 74. CXR obtained at bedside showed slight improvement compared to prior..  Suspected AMS due to temporary O2 desaturation with slow recovery as patient appeared to be showing some recovery.     Repaged Approximately 2:00AM  Paged for afib and HR 150-180s. Stat EKG was obtained showing a-flutter with variable AV block.   Per daughter at bedside, patient has appeared the same since last evaluation.  Physical exam of patient appears largely unchanged compared to prior.  She was noted to be tachycardic to 170's, but blood pressure remained stable 140/66. Patient appeared lethargic, resitng in bed.  Tachycardia with irregular rhythm.  Lungs clear, satting 90% 4L, however patient breathing predominantly through mouth. Neuro: intermittently responds to verbal stimuli  A/P: BMP, CBC mag, phos, STAT trops  ordered.   There is concern for possible PE given new tachycardia and worsening respiratory status. Wells score moderate risk if patient truly does have underlying malignancy. However, she may not be able to tolerate CT scan. She has underlying renal disease as well and worry that contrast would only further damage her kidneys. Lastly, if she has underlying malignancy, no benefit to obtaining d-dimer.    We will attemp to rate control with diltiazem. If rate improves, will obtain repeat EKG to further evaluate underlying rhythm/changes.  If her blood pressure does not tolerate, may need to consult cardiology.   Additionally, given acute worsening of thrombocytopenia, will obtain DIC panel, although thrombocytopenia may be related to underlying malignancy vs infection. ABX coverage broadened to include Vanc   ADDENDUM: Per RN, RRT able ot hear crackles in lung bases upon reassessment.   Patient resting comfortably.  Heart remains tachycardic. No LEE. No JVD. Lungs with crackles in dependent portions bilaterally. No wheeze. Skin: no obvious bruising or petechiae.   New crackles, likely in the setting of known volume overload and recent fluid administration with meds.  Will give Lasix 40.

## 2022-01-02 NOTE — Progress Notes (Signed)
PT Cancellation Note  Patient Details Name: CAGNEY STEENSON MRN: [id number] DOB: 1945/07/08   Cancelled Treatment:    Reason Eval/Treat Not Completed: Medical issues which prohibited therapy, HR sustained 160bpm at rest. Will check back as schedule allows to continue with PT POC.  Audry Riles. PTA Acute Rehabilitation Services Office: [place] 01/02/2022, 2:02 PM

## 2022-01-02 NOTE — Consult Note (Signed)
Consultation Note Date: 01/02/2022   Patient Name: [name]  DOB: December 01, 1945  MRN: [id number]  Age / Sex: 76 y.o., female  PCP: Axel Filler, MD Referring Physician: Sid Falcon, MD  Reason for Consultation: Establishing goals of care  HPI/Patient Profile: 76 y.o. female  with past medical history of  type 2 diabetes, renal transplant in 2010, abdominal aortic aneurysm, hypertension admitted on 12/31/2021 with generalized aches and pains, weakness for 3 weeks and poor appetite and decreased thirst for approximately 1 week.   Today patient is in A-fib with RVR, rate controlled after IV metoprolol.  She has AKI on CKD 3 and meets SIRS criteria, although more concerning for malignancy than infection.  PMT has been consulted to assist with goals of care conversation.  Clinical Assessment and Goals of Care:  I have reviewed medical records including EPIC notes, labs and imaging, received report from RN, assessed the patient and then met at the bedside with patient's son Linna Hoff to discuss diagnosis prognosis, GOC, EOL wishes, disposition and options.  I introduced Palliative Medicine as specialized medical care for people living with serious illness. It focuses on providing relief from the symptoms and stress of a serious illness. The goal is to improve quality of life for both the patient and the family.  We discussed a brief life review of the patient and then focused on their current illness.   I attempted to elicit values and goals of care important to the patient.    Medical History Review and Understanding:  Patient's son and I reviewed her current illness in detail.  He understands severity of her illness and that her tenuous condition is also greatly affected by multiple chronic conditions.  Social History: Patient has 1 son and 1 daughter.  She enjoys reading the Bible and watching her soap  opera.  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  Functional and Nutritional State: Patient's son shares that before admission she was doing well and he is shocked she has had such a rapid decline.  She did not use any assistive devices to ambulate.  Code Status: Concepts specific to code status, artifical feeding and hydration, and rehospitalization were considered and discussed.   Discussion: Patient's son reports receiving thorough updates and confirms he is awaiting to receive update on results that could show possible malignancy.  He feels that she would consider chemotherapy and/or other treatments if she does in fact have cancer.  His reasoning is because she has gone through dialysis and other treatments with difficult side effects in the past for the benefit of living longer.  He agrees it is important to consider her wishes first and hopes that current rate control of A-fib will allow her the chance to participate more in [place] discussions.  She had a good quality of life before the most recent few weeks.  She has had in the past she would never consider SNF, however he feels she would be willing to try given the circumstances currently.  He understands anything can happen at any time and is open to ongoing discussions and support from PMT.   The difference between aggressive medical intervention and comfort care was considered in light of the patient's goals of care. Hospice and Palliative Care services outpatient were explained and offered.   Discussed the importance of continued conversation with family and the medical providers regarding overall plan of care and treatment options, ensuring  decisions are within the context of the patient's values and GOCs.   Questions and concerns were addressed.  Hard Choices booklet left for review. The family was encouraged to call with questions or concerns.  PMT will continue to support  holistically.   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  Constitutional:      General: She is not in acute distress.    Appearance: She is ill-appearing.     Interventions: Face mask in place.     Comments: 15L venturi  Cardiovascular:     Rate and Rhythm: Tachycardia present. Rhythm irregularly irregular.  Pulmonary:     Effort: Tachypnea present.  Skin:    General: Skin is warm and dry.  Neurological:     Mental Status: She is unresponsive.     Vital Signs: BP 95/74 (BP Location: Right Arm)   Pulse (!) 163   Temp (!) 102.2 F (39 C) (Axillary)   Resp (!) 22   Ht $R'5\' 7"'dl$  (1.702 m)   Wt 94.2 kg   SpO2 92%   BMI 32.53 kg/m  Pain Scale: 0-10   Pain Score: Asleep   SpO2: SpO2: 92 % O2 Device:SpO2: 92 % O2 Flow Rate: .O2 Flow Rate (L/min): 11 L/min   Palliative Assessment/Data: 10%     MDM: High   Christal Lagerstrom Johnnette Litter, PA-C  Palliative Medicine Team Team phone # [phone]  Thank you for allowing the Palliative Medicine Team to assist in the care of this patient. Please utilize secure chat with additional questions, if there is no response within 30 minutes please call the above phone number.  Palliative Medicine Team providers are available by phone from 7am to 7pm daily and can be reached through the team cell phone.  Should this patient require assistance outside of these hours, please call the patient's attending physician.

## 2022-01-02 NOTE — Care Management Important Message (Signed)
Important Message  Patient Details  Name: [name] MRN: [id number] Date of Birth: 05-28-1946   Medicare Important Message Given:  Yes     Lynnie Koehler Montine Circle 01/02/2022, 3:26 PM

## 2022-01-02 NOTE — Consult Note (Signed)
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 This is a highly aggressive disease, likely predisposed by her chronic antirejection medicine for history of kidney transplant She is currently suffering from multiorgan failure She has persistent confusion with minimal response even with sternal rub Her ECOG performance status score is 4 and she is not a candidate for any form of treatment At this point in time, I do not recommend consideration of transferring her care to [facility] I suspect her prognosis is very poor, likely measured in days Ultimately, her daughter and son agree that she would not be a candidate for any form of treatment but at this point in time, they are not ready to transition her care to comfort measures  Severe anemia, thrombocytopenia and leukocytosis Due to bone marrow involvement She has received transfusion support I am concerned about risk of bleeding while on heparin When the family is ready, I recommend discontinuation of heparin when feasible While further blood transfusion support might improve her hemoglobin count and improve oxygenation, it could also increase risk of thrombosis due to hyperviscosity.  I recommend transfusion threshold at 7 g I recommend holding heparin if platelet count is less than 50,000  Elevated troponin, altered mental status This could be related to hyperviscosity from plasma cell leukemia While anticoagulation therapy could be helpful in the short-term, it could also increase the risk of bleeding  Acute on chronic renal failure Secondary to plasma cell leukemia Hydration as tolerated  Hypercalcemia Secondary to disease Hydration as tolerated I would not recommend high-dose steroid due to severe uncontrolled hyperglycemia  Goals of care discussion CODE  STATUS has been changed to DNR since admission I reviewed prognosis with her family, likely measured in days, shorter if her care is transitioned to comfort measures We discussed the role of palliative care and transitioning care to comfort measures to preserve dignity and prevent suffering.  Her son from [place] seems to agree but her daughter is not ready to transition her care yet I will defer to primary service to continue to work with her family regarding this  Discharge planning The patient would likely suffer from in-hospital death I will sign off.  Please call if questions arise  The total time spent in the appointment was 80 minutes encounter with patients including review of chart and various tests results, discussions about plan of care and coordination of care plan   All questions were answered. The patient knows to call the clinic with any problems, questions or concerns. No barriers to learning was detected.  Heath Lark, MD 7/11/20235:58 PM  CHIEF COMPLAINTS/PURPOSE OF CONSULTATION:  Newly diagnosed plasma cell leukemia, in the setting of chronic immunosuppression from antirejection medication for history of kidney transplant  HISTORY OF PRESENTING ILLNESS:  [name] 76 y.o. female is seen at the request by primary service.  The patient is unresponsive, history is not possible.  I reviewed her history with the primary service and reviewed her chart She have remote history of renal transplant and is on chronic antirejection medications.  In addition, she also have multiple cardiovascular risk factors including diabetes, hypertension, peripheral vascular disease, history of clotting disorder, class III obesity and others Over the past 4 weeks or so, family members have noted new onset of altered mental status, progressive weakness and feeling unwell. Apparently, she has  made some appointment to follow-up with her nephrologist recently There was also report of significant  pain, changes in bowel habits and possibly dehydration  On the day of admission on December 29, 2021, she was found to have acute on chronic renal failure, severe hyperglycemia, hypercalcemia, elevated white blood cell count, anemia and thrombocytopenia She was also found to have elevated serum troponin.  Her peripheral blood cells came back abnormal.  Multiple myeloma was suspected but SPEP is still pending Flow cytometry of her blood came back plasma cell leukemia Over the course of the hospital stay, she had received aggressive hydration, multiple evaluation including CT imaging, antibiotics, IV heparin, etc. Her mental status slightly improved with hydration but remained altered. On current examination, she is barely responsive to sternal rub.  She is on high flow oxygen.  She is noted to have atrial fibrillation with rapid ventricular response and abnormal vital signs  Her past medical history, past surgical history, family history and medications were reviewed.  Unable to perform further review of system Her son, Timmothy Sours and daughter earlier by bedside.  Another son, Josph Macho is available on FaceTime at the time of the evaluation and review of plan of care  MEDICAL HISTORY:  Past Medical History:  Diagnosis Date   AAA (abdominal aortic aneurysm) ([place])    3.8 cm Infrarenal managed at [facility]   Clotting disorder ([place])    PE, IVC filter placed at [facility]   Diabetes mellitus    Hypertension    Hypothyroidism    Morbid obesity ([place])    BMI 34   Renal transplant recipient    [facility]    SURGICAL HISTORY: Past Surgical History:  Procedure Laterality Date   ABDOMINAL AORTIC ANEURYSM REPAIR     KIDNEY TRANSPLANT      SOCIAL HISTORY: Social History   Socioeconomic History   Marital status: Divorced    Spouse name: Not on file   Number of children: Not on file   Years of education: 2 yrs coll   Highest education level: Not on file  Occupational History   Not on file  Tobacco Use    Smoking status: Former    Types: Cigarettes    Quit date: 03/02/2009    Years since quitting: 12.8   Smokeless tobacco: Never   Tobacco comments:    Quit x 102yr.  Substance and Sexual Activity   Alcohol use: Not Currently    Alcohol/week: 0.0 standard drinks of alcohol    Comment: Wine sometimes.   Drug use: No   Sexual activity: Not on file  Other Topics Concern   Not on file  Social History Narrative   Not on file   Social Determinants of Health   Financial Resource Strain: Not on file  Food Insecurity: Not on file  Transportation Needs: Not on file  Physical Activity: Not on file  Stress: Not on file  Social Connections: Not on file  Intimate Partner Violence: Not on file    FAMILY HISTORY: History reviewed. No pertinent family history.  ALLERGIES:  is allergic to hectorol [doxercalciferol], latex, other, vitamin d analogs, coumadin [warfarin sodium], and lisinopril.  MEDICATIONS:  Current Facility-Administered Medications  Medication Dose Route Frequency Provider Last Rate Last Admin   acetaminophen (TYLENOL) tablet 650 mg  650 mg Oral Q6H PRN ALacinda Axon MD   650 mg at 12/31/21 2133   Or   acetaminophen (TYLENOL) suppository 650 mg  650 mg Rectal Q6H PRN ALacinda Axon MD  650 mg at 01/02/22 0556   amiodarone (NEXTERONE PREMIX) 360-4.14 MG/200ML-% (1.8 mg/mL) IV infusion  30 mg/hr Intravenous Continuous Linus Galas, MD 16.67 mL/hr at 01/02/22 1245 30 mg/hr at 01/02/22 1245   aspirin EC tablet 81 mg  81 mg Oral Daily Lacinda Axon, MD   81 mg at 12/31/21 0937   cefTRIAXone (ROCEPHIN) 1 g in sodium chloride 0.9 % 100 mL IVPB  1 g Intravenous Q24H Nani Gasser, MD 200 mL/hr at 01/02/22 1110 1 g at 01/02/22 1110   guaiFENesin (ROBITUSSIN) 100 MG/5ML liquid 5 mL  5 mL Oral Q4H PRN Lacinda Axon, MD   5 mL at 12/31/21 1350   heparin ADULT infusion 100 units/mL (25000 units/22m)  950 Units/hr Intravenous Continuous ESkeet Simmer  RPH 9.5 mL/hr at 01/02/22 0945 950 Units/hr at 01/02/22 0945   insulin aspart (novoLOG) injection 0-15 Units  0-15 Units Subcutaneous TID WC ALacinda Axon MD   5 Units at 01/02/22 1623   insulin aspart (novoLOG) injection 0-5 Units  0-5 Units Subcutaneous QHS ALacinda Axon MD   2 Units at 01/01/22 2348   insulin glargine-yfgn (SEMGLEE) injection 40 Units  40 Units Subcutaneous QHS SLinus Galas MD       metoprolol tartrate (LOPRESSOR) injection 5 mg  5 mg Intravenous Q4H PRN HCharolette Forward MD   5 mg at 01/02/22 1712   Muscle Rub CREA   Topical PRN LIona Beard MD       mycophenolate (CELLCEPT) capsule 500 mg  500 mg Oral BID PElmarie Shiley MD   500 mg at 12/31/21 2134   ondansetron (ZOFRAN) tablet 4 mg  4 mg Oral Q6H PRN ALacinda Axon MD       Or   ondansetron ([facility] injection 4 mg  4 mg Intravenous Q6H PRN ALacinda Axon MD   4 mg at 12/31/21 1617   pantoprazole (PROTONIX) EC tablet 40 mg  40 mg Oral Daily ALacinda Axon MD   40 mg at 12/31/21 03903  pentoxifylline (TRENTAL) CR tablet 400 mg  400 mg Oral BID ALacinda Axon MD   400 mg at 12/31/21 2134   senna-docusate (Senokot-S) tablet 1 tablet  1 tablet Oral QHS PRN ALacinda Axon MD       sodium bicarbonate tablet 650 mg  650 mg Oral BID KJustin Mend MD       tacrolimus (PROGRAF) capsule 6 mg  6 mg Oral BID PElmarie Shiley MD   6 mg at 12/31/21 2132   [START ON 01/03/2022] vancomycin (VANCOREADY) IVPB 500 mg/100 mL  500 mg Intravenous Q24H Rudisill, TJodean Lima RPH        REVIEW OF SYSTEMS:  All other systems were reviewed with the patient and are negative.  PHYSICAL EXAMINATION: ECOG PERFORMANCE STATUS: 4 - Bedbound  Vitals:   01/02/22 1700 01/02/22 1716  BP: (!) 139/58   Pulse: (!) 150 (!) 124  Resp: (!) 22   Temp: (!) 100.9 F (38.3 C)   SpO2: 93%    Filed Weights   12/31/21 0300 01/01/22 0439 01/02/22 0408  Weight: 198 lb 6.4 oz (90 kg) 195 lb 5.2 oz (88.6 kg) 207 lb  10.8 oz (94.2 kg)    GENERAL: She is intermittently moving all her 4 limbs but could not open her eyes or follow commands.  She has high flow oxygen in situ SKIN: no rash appreciated EYES: Her eyes are closed OROPHARYNX: Unable to evaluate due to high flow  oxygen via facemask NECK: supple, thyroid normal size, non-tender, without nodularity LYMPH:  no palpable lymphadenopathy in the cervical, axillary or inguinal LUNGS: clear to auscultation and percussion with increased breathing effort HEART: irregular rate & rhythm and no murmurs and no lower extremity edema ABDOMEN:abdomen soft, non-tender and normal bowel sounds Musculoskeletal:no cyanosis of digits and no clubbing  PSYCH: Unable to assess NEURO: Unable to assess  LABORATORY DATA:  I have reviewed the data as listed Lab Results  Component Value Date   WBC 32.3 (H) 01/02/2022   HGB 8.0 (L) 01/02/2022   HCT 23.2 (L) 01/02/2022   MCV 83.9 01/02/2022   PLT 72 (L) 01/02/2022   Recent Labs    01/01/2022 1039 01/07/2022 1537 12/31/21 0241 01/01/22 0311 01/02/22 0241  NA 131*   < > 142 138 143  K 5.4*   < > 5.0 4.9 5.1  CL 100   < > 111 108 110  CO2 17*   < > 19* 16* 17*  GLUCOSE 454*   < > 233* 249* 257*  BUN 70*   < > 45* 37* 42*  CREATININE 3.55*   < > 2.29* 2.40* 2.27*  CALCIUM 10.6*   < > 11.2* 10.7* 10.9*  GFRNONAA 13*   < > 22* 21* 22*  PROT 6.3*  --   --   --   --   ALBUMIN 3.7   < > 3.4* 3.2* 3.4*  AST 30  --   --   --   --   ALT 34  --   --   --   --   ALKPHOS 61  --   --   --   --   BILITOT 1.6*  --   --   --   --    < > = values in this interval not displayed.    RADIOGRAPHIC STUDIES: I have personally reviewed the radiological images as listed and agreed with the findings in the report. NM Pulmonary Perf and Vent  Result Date: 01/02/2022 CLINICAL DATA:  Tachycardia EXAM: NUCLEAR MEDICINE PERFUSION LUNG SCAN TECHNIQUE: Perfusion images were obtained in multiple projections after intravenous injection of  radiopharmaceutical. Ventilation scans intentionally deferred if perfusion scan and chest x-ray adequate for interpretation during COVID 19 epidemic. RADIOPHARMACEUTICALS:  4.3 mCi Tc-20mMAA IV COMPARISON:  Chest radiograph dated January 02, 2022 FINDINGS: No segmental perfusion defects. IMPRESSION: Pulmonary embolism absent (very low probability) Electronically Signed   By: LYetta GlassmanM.D.   On: 01/02/2022 14:23   CT HEAD WO CONTRAST (5MM)  Result Date: 01/02/2022 CLINICAL DATA:  Mental status change, persistent or worsening EXAM: CT HEAD WITHOUT CONTRAST TECHNIQUE: Contiguous axial images were obtained from the base of the skull through the vertex without intravenous contrast. RADIATION DOSE REDUCTION: This exam was performed according to the departmental dose-optimization program which includes automated exposure control, adjustment of the mA and/or kV according to patient size and/or use of iterative reconstruction technique. COMPARISON:  MRI head 05/27/2007 FINDINGS: Brain: No evidence of acute large vascular territory infarction, hemorrhage, hydrocephalus, extra-axial collection or mass lesion/mass effect. Remote appearing small right cerebellar and basal ganglia lacunar infarcts. Moderate patchy white matter hypodensities, nonspecific but compatible with chronic microvascular ischemic disease. Vascular: No hyperdense vessel identified. Calcific intracranial atherosclerosis. Skull: No acute fracture. Sinuses/Orbits: Mild paranasal sinus mucosal thickening. No acute orbital findings. Other: No mastoid effusions. IMPRESSION: 1. No definite evidence of acute intracranial abnormality 2. Moderate chronic microvascular ischemic disease and remote appearing lacunar infarcts. MRI  could provide more sensitive evaluation for acute infarct if clinically warranted. Electronically Signed   By: Margaretha Sheffield M.D.   On: 01/02/2022 09:15   DG Chest Port 1 View  Result Date: 01/02/2022 CLINICAL DATA:  Acute  respiratory distress EXAM: PORTABLE CHEST 1 VIEW COMPARISON:  01/01/2022, 01/22/2022 FINDINGS: Mild cardiomegaly with vascular congestion and mild interstitial edema. Small bilateral effusions. Basilar consolidation on the left. Findings do not appear significantly changed. IMPRESSION: 1. Cardiomegaly with vascular congestion, mild interstitial edema and small pleural effusions 2. Consolidation at the left lung base may be due to atelectasis or pneumonia. Electronically Signed   By: Donavan Foil M.D.   On: 01/02/2022 01:44   DG CHEST PORT 1 VIEW  Result Date: 01/01/2022 CLINICAL DATA:  200811 decreased pulse ox EXAM: PORTABLE CHEST 1 VIEW COMPARISON:  December 29, 2021 FINDINGS: Heart is borderline. Minor atelectasis at the left lung base. Central pulmonary vascular congestion and is more prominent in the present study. The visualized skeletal structures are unremarkable. IMPRESSION: Heart is borderline. Central pulmonary vascular congestion. Left basilar minor atelectasis. Electronically Signed   By: Frazier Richards M.D.   On: 01/01/2022 11:09   ECHOCARDIOGRAM COMPLETE  Result Date: 12/31/2021    ECHOCARDIOGRAM REPORT   Patient Name:   [name] [facility] Date of Exam: 12/31/2021 Medical Rec #:  [id number]          Height:       67.0 in Accession #:    [id number]         Weight:       198.4 lb Date of Birth:  [birth year]          BSA:          2.015 m Patient Age:    89 years           BP:           126/48 mmHg Patient Gender: F                  HR:           96 bpm. Exam Location:  Inpatient Procedure: 2D Echo, Cardiac Doppler and Color Doppler Indications:    R06.00 Dyspnea  History:        Patient has no prior history of Echocardiogram examinations.                 Risk Factors:Hypertension, Diabetes and Dyslipidemia. Hx of                 COVID-19.  Sonographer:    Alvino Chapel RCS Referring Phys: [place] Run  1. Left ventricular ejection fraction, by estimation, is 70 to 75%. The left  ventricle has hyperdynamic function. The left ventricle has no regional wall motion abnormalities. Left ventricular diastolic parameters are consistent with Grade I diastolic dysfunction (impaired relaxation).  2. Right ventricular systolic function is normal. The right ventricular size is normal. Tricuspid regurgitation signal is inadequate for assessing PA pressure.  3. Left atrial size was severely dilated.  4. The mitral valve is abnormal. Trivial mitral valve regurgitation.  5. The aortic valve is tricuspid. Aortic valve regurgitation is not visualized. Aortic valve sclerosis/calcification is present, without any evidence of aortic stenosis.  6. The inferior vena cava is dilated in size with >50% respiratory variability, suggesting right atrial pressure of 8 mmHg. Comparison(s): No prior Echocardiogram. FINDINGS  Left Ventricle: Left ventricular ejection fraction, by estimation, is 70 to 75%. The left ventricle  has hyperdynamic function. The left ventricle has no regional wall motion abnormalities. The left ventricular internal cavity size was normal in size. There is no left ventricular hypertrophy. Left ventricular diastolic parameters are consistent with Grade I diastolic dysfunction (impaired relaxation). Indeterminate filling pressures. Right Ventricle: The right ventricular size is normal. No increase in right ventricular wall thickness. Right ventricular systolic function is normal. Tricuspid regurgitation signal is inadequate for assessing PA pressure. Left [facility]: Left atrial size was severely dilated. Right [facility]: Right atrial size was normal in size. Pericardium: There is no evidence of pericardial effusion. Mitral Valve: The mitral valve is abnormal. Mild mitral annular calcification. Trivial mitral valve regurgitation. Tricuspid Valve: The tricuspid valve is grossly normal. Tricuspid valve regurgitation is not demonstrated. Aortic Valve: The aortic valve is tricuspid. Aortic valve regurgitation is  not visualized. Aortic valve sclerosis/calcification is present, without any evidence of aortic stenosis. Aortic valve mean gradient measures 9.5 mmHg. Aortic valve peak gradient measures 20.2 mmHg. Aortic valve area, by VTI measures 2.48 cm. Pulmonic Valve: The pulmonic valve was grossly normal. Pulmonic valve regurgitation is trivial. Aorta: The aortic root and ascending aorta are structurally normal, with no evidence of dilitation. Venous: The inferior vena cava is dilated in size with greater than 50% respiratory variability, suggesting right atrial pressure of 8 mmHg. IAS/Shunts: No atrial level shunt detected by color flow Doppler.  LEFT VENTRICLE PLAX 2D LVIDd:         4.70 cm   Diastology LVIDs:         2.70 cm   LV e' medial:    7.83 cm/s LV PW:         1.00 cm   LV E/e' medial:  20.1 LV IVS:        1.00 cm   LV e' lateral:   10.00 cm/s LVOT diam:     1.90 cm   LV E/e' lateral: 15.7 LV SV:         104 LV SV Index:   52 LVOT Area:     2.84 cm  RIGHT VENTRICLE RV S prime:     17.60 cm/s TAPSE (M-mode): 2.3 cm LEFT ATRIUM              Index        RIGHT ATRIUM           Index LA diam:        3.20 cm  1.59 cm/m   RA Area:     17.30 cm LA Vol (A2C):   94.6 ml  46.94 ml/m  RA Volume:   44.90 ml  22.28 ml/m LA Vol (A4C):   110.0 ml 54.58 ml/m LA Biplane Vol: 113.0 ml 56.07 ml/m  AORTIC VALVE AV Area (Vmax):    2.08 cm AV Area (Vmean):   2.27 cm AV Area (VTI):     2.48 cm AV Vmax:           224.50 cm/s AV Vmean:          138.500 cm/s AV VTI:            0.421 m AV Peak Grad:      20.2 mmHg AV Mean Grad:      9.5 mmHg LVOT Vmax:         165.00 cm/s LVOT Vmean:        111.000 cm/s LVOT VTI:          0.368 m LVOT/AV VTI ratio: 0.87  AORTA Ao Root diam: 3.30  cm MITRAL VALVE MV Area (PHT): 3.08 cm     SHUNTS MV Decel Time: 246 msec     Systemic VTI:  0.37 m MV E velocity: 157.00 cm/s  Systemic Diam: 1.90 cm MV A velocity: 150.00 cm/s MV E/A ratio:  1.05 Lyman Bishop MD Electronically signed by Lyman Bishop  MD Signature Date/Time: 12/31/2021/3:32:02 PM    Final    CT CHEST WO CONTRAST  Result Date: 12/30/2021 CLINICAL DATA:  Chronic dyspnea evaluate for possible malignancy EXAM: CT CHEST WITHOUT CONTRAST TECHNIQUE: Multidetector CT imaging of the chest was performed following the standard protocol without IV contrast. RADIATION DOSE REDUCTION: This exam was performed according to the departmental dose-optimization program which includes automated exposure control, adjustment of the mA and/or kV according to patient size and/or use of iterative reconstruction technique. COMPARISON:  None Available. FINDINGS: Cardiovascular: Aortic atherosclerosis. Normal heart size. Left and right coronary artery calcifications. No pericardial effusion. Enlargement of the main pulmonary artery measuring 3.7 cm. Mediastinum/Nodes: No enlarged mediastinal, hilar, or axillary lymph nodes. Small hiatal hernia. Thyroid gland, trachea, and esophagus demonstrate no significant findings. Lungs/Pleura: Lungs are clear. No pleural effusion or pneumothorax. Upper Abdomen: No acute abnormality. Severely atrophic kidneys, partially imaged. Musculoskeletal: No chest wall abnormality. No suspicious osseous lesions identified. IMPRESSION: 1. No suspicious pulmonary nodules. No evidence of mass or lymphadenopathy in the chest. 2. No acute abnormality of the lungs. 3. Enlargement of the main pulmonary artery, as can be seen in pulmonary hypertension. 4. Coronary artery disease. Aortic Atherosclerosis (ICD10-I70.0). Electronically Signed   By: Delanna Ahmadi M.D.   On: 12/30/2021 15:26   US Renal Transplant w/Doppler  Result Date: 01/08/2022 CLINICAL DATA:  Renal transplant EXAM: ULTRASOUND OF RENAL TRANSPLANT WITH RENAL DOPPLER ULTRASOUND TECHNIQUE: Ultrasound examination of the renal transplant was performed with gray-scale, color and duplex doppler evaluation. COMPARISON:  CT 01/12/2022 FINDINGS: Transplant kidney location: RIGHT iliac fossa  Transplant Kidney: Renal measurements: 12.7 x 6.4 x 7.2 cm = volume: 323m. Normal in size and parenchymal echogenicity. No evidence of mass or hydronephrosis. No peri-transplant fluid collection seen. Color flow in the main renal artery:  Present Color flow in the main renal vein:  Present Duplex Doppler Evaluation: Main Renal Artery Resistive Index: 0.88 Venous waveform in main renal vein:  Present Intrarenal resistive index in upper pole:  0.64 (normal 0.6-0.8; equivocal 0.8-0.9; abnormal >= 0.9) Intrarenal resistive index in lower pole: 0.68 (normal 0.6-0.8; equivocal 0.8-0.9; abnormal >= 0.9) Bladder: Normal for degree of bladder distention. Other findings:  Ureteral jets not identified. IMPRESSION: 1. No hydronephrosis. 2. Main renal artery resistive index equivocal range. 3. Intrarenal resistive index is within normal limits. 4. Arterial and venous color flow at the hila. Electronically Signed   By: SSuzy BouchardM.D.   On: 01/06/2022 14:55   DG Chest 2 View  Result Date: 12/27/2021 CLINICAL DATA:  Leukocytosis in a 76year old female. EXAM: CHEST - 2 VIEW COMPARISON:  March 04, 2020. FINDINGS: EKG leads project over the chest. Cardiomediastinal contours and hilar structures are stable with mild cardiac enlargement accentuated by AP technique. No lobar consolidation.  No gross effusion.  No pneumothorax. On limited assessment there is no acute skeletal finding. IMPRESSION: Mild cardiac enlargement without acute cardiopulmonary process. Electronically Signed   By: GZetta BillsM.D.   On: 01/03/2022 14:19   CT ABDOMEN PELVIS WO CONTRAST  Result Date: 01/17/2022 CLINICAL DATA:  Generalized abdominal pain for couple of weeks. Acute kidney failure. EXAM: CT ABDOMEN AND PELVIS WITHOUT  CONTRAST TECHNIQUE: Multidetector CT imaging of the abdomen and pelvis was performed following the standard protocol without IV contrast. RADIATION DOSE REDUCTION: This exam was performed according to the departmental  dose-optimization program which includes automated exposure control, adjustment of the mA and/or kV according to patient size and/or use of iterative reconstruction technique. COMPARISON:  MRI abdomen February 24, 2014 FINDINGS: Lower chest: No acute abnormality. Hepatobiliary: Unremarkable noncontrast appearance of the liver. Cholelithiasis without findings of acute cholecystitis. No biliary ductal dilation. Pancreas: No pancreatic ductal dilation or evidence of acute inflammation. Spleen: No splenomegaly or focal splenic lesion. Adrenals/Urinary Tract: Bilateral adrenal glands are within normal limits. Calcifications in bilateral atrophic kidneys. Right lower quadrant transplant kidney without hydronephrosis or nephrolithiasis. Urinary bladder is unremarkable for degree of distension. Stomach/Bowel: No radiopaque enteric contrast material was administered. Stomach is nondistended limiting evaluation. No pathologic dilation of small or large bowel. The appendix and terminal ileum appear normal. Scattered colonic diverticulosis without findings of acute diverticulitis. No evidence of acute bowel inflammation. 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 Infrarenal IVC filter. No pathologically enlarged abdominal or pelvic lymph nodes. Reproductive: Uterus and bilateral adnexa are unremarkable. Other: No significant abdominopelvic free fluid. Portions of the anterior abdominal wall are excluded by combination due to patient habitus limiting evaluation. Musculoskeletal: Multilevel degenerative changes spine. No acute osseous abnormality. IMPRESSION: 1. No hydronephrosis or evidence of acute bowel inflammation. 2. 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 3. 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 4. Cholelithiasis without findings of acute cholecystitis. 5.  Aortic Atherosclerosis (ICD10-I70.0). Electronically Signed   By: Dahlia Bailiff M.D.   On: 01/03/2022 13:54

## 2022-01-02 NOTE — Significant Event (Addendum)
Rapid Response Event Note   Reason for Call :  Pt with AMS and spO2 76%  Initial Focused Assessment:  On arrival, pt opens eyes to sternal rub only. HR 113, BP 153/64, spO2 90% on 7L [place], RR 20. Lungs diminished, CBG 228. Primary RN states they had just cleaned pt up and spO2 dropped with turning and movement in bed. Previously on 2L [place] (had fallen out during movement).     Interventions:  Placed on salter 7L- spO2 improved and weaned back down to 3L [place]- 7.39/41/74/24 CXR Primary team at bedside  Plan of Care:  Continue to monitor pt status.   RN instructed to call with any changes or concerns.    Event Summary:   MD Notified:  Call Time: [place] Time: 1236 End Time: 0133  Sherilyn Dacosta, RN

## 2022-01-02 NOTE — Progress Notes (Signed)
Patient ID: [name], female   DOB: 1945/11/24, 76 y.o.   MRN: [id number] [facility] Progress Note   Assessment/ Plan:   1.  Acute kidney injury on chronic kidney disease stage IIIB: Baseline Cr 1.5-2, with AKI Cr up to 3.6 on presentation.  Based on the history and physical exam, appears to be largely hemodynamically mediated acute kidney injury especially with concomitant hyperglycemia and significantly elevated BUN: Creatinine ratio.  No evidence of GN and without growth on urine culture.  Renal function has improved with intravenous fluids and is 2.27 today. Tacrolimus level 6.2 which is appropriate.  With acute events today may precipitate another AKI, will continue to follow.   2.  Sepsis: febrile with leukopenia; cultures being obtained and antibiotics per primary.  3.  Hypercalcemia: With elevated ionized calcium and normal 25-hydroxy vitamin D level.  PTH 16.  PTHrp and myeloma w/u pending. 1-25-hydroxy vitamin D level low.  Non PTH mediated hyperCa.  4.  Generalized weakness/pain: per primary 5.  Hypertension: Blood pressure under decent control on amlodipine > now with lower BPs concerning for developing sepsis.  With enlarging AAA and outpatient vascular surgery at [facility] endovascular recommended. 6.  Insulin-dependent type 2 diabetes mellitus: meds and diet per primary service. 7.  AMS:  Not interacting today.   Defer additional w/u to primary.   8. Anemia, normocytic - Hb now in 7s, transfusion per primary. Iron, folate, B12 replete  In setting of unexplained leukocytosis and thrombocytopenia + hyperCa myeloma w/u per above.   9.  Anion gap metabolic acidosis: Secondary to acute kidney injury. Cont low dose oral bicarb.  10. A fib with RVR: cardiology consulting this afternoon.   Subjective:   Febrile overnight, AMS, A fib with RVR.  Cardiology consulted.  I/Os 0.6 / 1.95L all UOP Mental status worse.  Palliative consulted.    Objective:   BP (!)  110/58 (BP Location: Right Arm)   Pulse (!) 160   Temp (!) 102.2 F (39 C) (Axillary)   Resp 20   Ht '5\' 7"'$  (1.702 m)   Wt 94.2 kg   SpO2 94%   BMI 32.53 kg/m   Intake/Output Summary (Last 24 hours) at 01/02/2022 0800 Last data filed at 01/02/2022 9326 Gross per 24 hour  Intake 621.14 ml  Output 1950 ml  Net -1328.86 ml    Weight change: 5.6 kg  Physical Exam: Gen: ill appearing in bed with NRB in place CVS: Pulse regular rhythm, normal rate, S1 and S2 normal Resp: Clear to auscultation bilaterally, no rales/rhonchi Abd: Soft, obese, nontender with deep palpation Ext: No lower extremity edema Neuro: moving spontaneously but not interacting  Imaging: DG Chest Port 1 View  Result Date: 01/02/2022 CLINICAL DATA:  Acute respiratory distress EXAM: PORTABLE CHEST 1 VIEW COMPARISON:  01/01/2022, 12/28/2021 FINDINGS: Mild cardiomegaly with vascular congestion and mild interstitial edema. Small bilateral effusions. Basilar consolidation on the left. Findings do not appear significantly changed. IMPRESSION: 1. Cardiomegaly with vascular congestion, mild interstitial edema and small pleural effusions 2. Consolidation at the left lung base may be due to atelectasis or pneumonia. Electronically Signed   By: Donavan Foil M.D.   On: 01/02/2022 01:44   DG CHEST PORT 1 VIEW  Result Date: 01/01/2022 CLINICAL DATA:  200811 decreased pulse ox EXAM: PORTABLE CHEST 1 VIEW COMPARISON:  December 29, 2021 FINDINGS: Heart is borderline. Minor atelectasis at the left lung base. Central pulmonary vascular congestion and is more prominent in the present study.  The visualized skeletal structures are unremarkable. IMPRESSION: Heart is borderline. Central pulmonary vascular congestion. Left basilar minor atelectasis. Electronically Signed   By: Frazier Richards M.D.   On: 01/01/2022 11:09   ECHOCARDIOGRAM COMPLETE  Result Date: 12/31/2021    ECHOCARDIOGRAM REPORT   Patient Name:   [name] Date of Exam:  12/31/2021 Medical Rec #:  [id number]          Height:       67.0 in Accession #:    [id number]         Weight:       198.4 lb Date of Birth:  [birth year]          BSA:          2.015 m Patient Age:    50 years           BP:           126/48 mmHg Patient Gender: F                  HR:           96 bpm. Exam Location:  Inpatient Procedure: 2D Echo, Cardiac Doppler and Color Doppler Indications:    R06.00 Dyspnea  History:        Patient has no prior history of Echocardiogram examinations.                 Risk Factors:Hypertension, Diabetes and Dyslipidemia. Hx of                 COVID-19.  Sonographer:    Alvino Chapel RCS Referring Phys: Boyd  1. Left ventricular ejection fraction, by estimation, is 70 to 75%. The left ventricle has hyperdynamic function. The left ventricle has no regional wall motion abnormalities. Left ventricular diastolic parameters are consistent with Grade I diastolic dysfunction (impaired relaxation).  2. Right ventricular systolic function is normal. The right ventricular size is normal. Tricuspid regurgitation signal is inadequate for assessing PA pressure.  3. Left atrial size was severely dilated.  4. The mitral valve is abnormal. Trivial mitral valve regurgitation.  5. The aortic valve is tricuspid. Aortic valve regurgitation is not visualized. Aortic valve sclerosis/calcification is present, without any evidence of aortic stenosis.  6. The inferior vena cava is dilated in size with >50% respiratory variability, suggesting right atrial pressure of 8 mmHg. Comparison(s): No prior Echocardiogram. FINDINGS  Left Ventricle: Left ventricular ejection fraction, by estimation, is 70 to 75%. The left ventricle has hyperdynamic function. The left ventricle has no regional wall motion abnormalities. The left ventricular internal cavity size was normal in size. There is no left ventricular hypertrophy. Left ventricular diastolic parameters are consistent with Grade I  diastolic dysfunction (impaired relaxation). Indeterminate filling pressures. Right Ventricle: The right ventricular size is normal. No increase in right ventricular wall thickness. Right ventricular systolic function is normal. Tricuspid regurgitation signal is inadequate for assessing PA pressure. Left Atrium: Left atrial size was severely dilated. Right Atrium: Right atrial size was normal in size. Pericardium: There is no evidence of pericardial effusion. Mitral Valve: The mitral valve is abnormal. Mild mitral annular calcification. Trivial mitral valve regurgitation. Tricuspid Valve: The tricuspid valve is grossly normal. Tricuspid valve regurgitation is not demonstrated. Aortic Valve: The aortic valve is tricuspid. Aortic valve regurgitation is not visualized. Aortic valve sclerosis/calcification is present, without any evidence of aortic stenosis. Aortic valve mean gradient measures 9.5 mmHg. Aortic valve peak gradient measures 20.2 mmHg. Aortic  valve area, by VTI measures 2.48 cm. Pulmonic Valve: The pulmonic valve was grossly normal. Pulmonic valve regurgitation is trivial. Aorta: The aortic root and ascending aorta are structurally normal, with no evidence of dilitation. Venous: The inferior vena cava is dilated in size with greater than 50% respiratory variability, suggesting right atrial pressure of 8 mmHg. IAS/Shunts: No atrial level shunt detected by color flow Doppler.  LEFT VENTRICLE PLAX 2D LVIDd:         4.70 cm   Diastology LVIDs:         2.70 cm   LV e' medial:    7.83 cm/s LV PW:         1.00 cm   LV E/e' medial:  20.1 LV IVS:        1.00 cm   LV e' lateral:   10.00 cm/s LVOT diam:     1.90 cm   LV E/e' lateral: 15.7 LV SV:         104 LV SV Index:   52 LVOT Area:     2.84 cm  RIGHT VENTRICLE RV S prime:     17.60 cm/s TAPSE (M-mode): 2.3 cm LEFT ATRIUM              Index        RIGHT ATRIUM           Index LA diam:        3.20 cm  1.59 cm/m   RA Area:     17.30 cm LA Vol (A2C):   94.6 ml   46.94 ml/m  RA Volume:   44.90 ml  22.28 ml/m LA Vol (A4C):   110.0 ml 54.58 ml/m LA Biplane Vol: 113.0 ml 56.07 ml/m  AORTIC VALVE AV Area (Vmax):    2.08 cm AV Area (Vmean):   2.27 cm AV Area (VTI):     2.48 cm AV Vmax:           224.50 cm/s AV Vmean:          138.500 cm/s AV VTI:            0.421 m AV Peak Grad:      20.2 mmHg AV Mean Grad:      9.5 mmHg LVOT Vmax:         165.00 cm/s LVOT Vmean:        111.000 cm/s LVOT VTI:          0.368 m LVOT/AV VTI ratio: 0.87  AORTA Ao Root diam: 3.30 cm MITRAL VALVE MV Area (PHT): 3.08 cm     SHUNTS MV Decel Time: 246 msec     Systemic VTI:  0.37 m MV E velocity: 157.00 cm/s  Systemic Diam: 1.90 cm MV A velocity: 150.00 cm/s MV E/A ratio:  1.05 Lyman Bishop MD Electronically signed by Lyman Bishop MD Signature Date/Time: 12/31/2021/3:32:02 PM    Final     Labs: BMET Recent Labs  Lab 01/15/2022 1039 01/07/2022 1537 01/01/2022 2052 01/18/2022 2346 12/30/21 0859 12/31/21 0241 01/01/22 0311 01/02/22 0241  NA 131* 134* 138 136 141 142 138 143  K 5.4* 5.3* 5.1 5.0 4.8 5.0 4.9 5.1  CL 100  --  107 107 108 111 108 110  CO2 17*  --  18* 19* 18* 19* 16* 17*  GLUCOSE 454*  --  306* 329* 243* 233* 249* 257*  BUN 70*  --  70* 67* 58* 45* 37* 42*  CREATININE 3.55*  --  3.16* 3.00* 2.68*  2.29* 2.40* 2.27*  CALCIUM 10.6*  --  10.6* 10.5* 11.0* 11.2* 10.7* 10.9*  PHOS  --   --  4.5 4.3 4.1 3.4 4.1 4.1    CBC Recent Labs  Lab 12/28/2021 1039 01/17/2022 1537 01/18/2022 2346 12/31/21 0241 01/01/22 0638 01/01/22 1354 01/02/22 0241 01/02/22 0410  WBC 33.9*  --  27.3* 33.4* 37.9*  --  32.3*  --   NEUTROABS 11.5*  --   --  5.6 16.3*  --  5.3  --   HGB 8.2*   < > 7.2* 7.2* 6.1* 7.5* 7.7*  --   HCT 23.9*   < > 20.8* 20.8* 17.9* 22.1* 22.9*  --   MCV 82.4  --  81.3 81.9 82.1  --  83.9  --   PLT 101*  --  91* 94* 75*  --  37* 72*   < > = values in this interval not displayed.     Medications:     aspirin EC  81 mg Oral Daily   insulin aspart  0-15 Units  Subcutaneous TID WC   insulin aspart  0-5 Units Subcutaneous QHS   insulin glargine-yfgn  38 Units Subcutaneous QHS   mycophenolate  500 mg Oral BID   pantoprazole  40 mg Oral Daily   pentoxifylline  400 mg Oral BID   sodium bicarbonate  650 mg Oral BID   tacrolimus  6 mg Oral BID    Jannifer Hick MD [facility] Pager [phone]

## 2022-01-02 NOTE — Consult Note (Signed)
Reason for Consult atrial flutter/fib with RVR Referring Physician: Teaching service  [name] is an 76 y.o. female.  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  Cardiology consultation is called as patient converted to A-fib flutter with RVR patient received IV Cardizem and IV amiodarone bolus and drip and also continued on Cardizem drip without improvement in her heart rate.  Patient has been lethargic for last 2 days and responds to verbal command by morning.  No meaningful history could be obtained.  Past Medical History:  Diagnosis Date   AAA (abdominal aortic aneurysm) ([place])    3.8 cm Infrarenal managed at [place] disorder [facility])    PE, IVC filter placed at [facility]   Diabetes mellitus    Hypertension    Hypothyroidism    Morbid obesity ([place])    BMI 34   Renal transplant recipient    [facility]    Past Surgical History:  Procedure Laterality Date   ABDOMINAL AORTIC ANEURYSM REPAIR     KIDNEY TRANSPLANT      History reviewed. No pertinent family history.  Social History:  reports that she quit smoking about 12 years ago. Her smoking use included cigarettes. She has never used smokeless tobacco. She reports that she does not currently use alcohol. She reports that she  does not use drugs.  Allergies:  Allergies  Allergen Reactions   Hectorol [Doxercalciferol] Anaphylaxis and Shortness Of Breath   Latex Anaphylaxis and Other (See Comments)    discharge   Other Rash    Thin toilet tissue and medical paper table covering causes rash.   Vitamin D Analogs Anaphylaxis and Shortness Of Breath    Could not catch breath and had bad reaction   Coumadin [Warfarin Sodium] Other (See Comments)    Hemorhage. Has IVC filter   Lisinopril Cough    Medications: I have reviewed the patient's current medications.  Results for orders placed or performed during the hospital encounter of 01/12/2022 (from the past 48 hour(s))  Glucose, capillary     Status: Abnormal   Collection Time: 12/31/21  4:13 PM  Result Value Ref Range   Glucose-Capillary 189 (H) 70 - 99 mg/dL    Comment: Glucose reference range applies only to samples taken after fasting for at least 8 hours.  Glucose, capillary     Status: Abnormal   Collection Time: 12/31/21  9:10 PM  Result Value Ref Range   Glucose-Capillary 236 (H) 70 - 99 mg/dL    Comment: Glucose reference range applies only to samples taken after fasting for at least 8 hours.   Comment 1 Notify RN    Comment 2 Document in Chart   Renal function panel     Status: Abnormal   Collection Time: 01/01/22  3:11 AM  Result Value Ref Range   Sodium 138 135 - 145  mmol/L   Potassium 4.9 3.5 - 5.1 mmol/L   Chloride 108 98 - 111 mmol/L   CO2 16 (L) 22 - 32 mmol/L   Glucose, Bld 249 (H) 70 - 99 mg/dL    Comment: Glucose reference range applies only to samples taken after fasting for at least 8 hours.   BUN 37 (H) 8 - 23 mg/dL   Creatinine, Ser 2.40 (H) 0.44 - 1.00 mg/dL   Calcium 10.7 (H) 8.9 - 10.3 mg/dL   Phosphorus 4.1 2.5 - 4.6 mg/dL   Albumin 3.2 (L) 3.5 - 5.0 g/dL   GFR, Estimated 21 (L) >60 mL/min    Comment: (NOTE) Calculated using the CKD-EPI Creatinine Equation (2021)    Anion gap 14 5 - 15    Comment: Performed at [facility] [address] [place], [place] [address]  Glucose, capillary     Status: Abnormal   Collection Time: 01/01/22  6:02 AM  Result Value Ref Range   Glucose-Capillary 250 (H) 70 - 99 mg/dL    Comment: Glucose reference range applies only to samples taken after fasting for at least 8 hours.   Comment 1 Notify RN   CBC with Differential/Platelet     Status: Abnormal   Collection Time: 01/01/22  6:38 AM  Result Value Ref Range   WBC 37.9 (H) 4.0 - 10.5 K/uL   RBC 2.18 (L) 3.87 - 5.11 MIL/uL   Hemoglobin 6.1 (LL) 12.0 - 15.0 g/dL    Comment: REPEATED TO VERIFY THIS CRITICAL RESULT HAS VERIFIED AND BEEN CALLED TO DAUDA COLLIER,RN BY ZELDA BEECH ON 07 10 2023 AT [id number], AND HAS BEEN READ BACK.     HCT 17.9 (L) 36.0 - 46.0 %   MCV 82.1 80.0 - 100.0 fL   MCH 28.0 26.0 - 34.0 pg   MCHC 34.1 30.0 - 36.0 g/dL   RDW 19.3 (H) 11.5 - 15.5 %   Platelets 75 (L) 150 - 400 K/uL    Comment: Immature Platelet Fraction may be clinically indicated, consider ordering this additional test [id number] REPEATED TO VERIFY    nRBC 0.4 (H) 0.0 - 0.2 %   Neutrophils Relative % 43 %   Neutro Abs 16.3 (H) 1.7 - 7.7 K/uL   Lymphocytes Relative 46 %   Lymphs Abs 17.4 (H) 0.7 - 4.0 K/uL   Monocytes Relative 10 %   Monocytes Absolute 3.8 (H) 0.1 - 1.0 K/uL   Eosinophils Relative 1 %   Eosinophils Absolute 0.4 0.0 - 0.5 K/uL   Basophils Relative 0 %   Basophils Absolute 0.0 0.0 - 0.1 K/uL   WBC Morphology PLASMACYTOID LYMPHS    RBC Morphology See Note    Smear Review PLATELETS APPEAR DECREASED    Abs Immature Granulocytes 0.00 0.00 - 0.07 K/uL   Target Cells PRESENT     Comment: Performed at [facility], [address] [place], [place] [address]  Type and screen [place]     Status: None   Collection Time: 01/01/22  7:28 AM  Result Value Ref Range   ABO/RH(D) A POS    Antibody Screen NEG    Sample Expiration 01-09-22,2359    Unit Number [id number]    Blood Component  Type RED CELLS,LR    Unit division 00    Status of Unit ISSUED,FINAL    Transfusion Status OK TO TRANSFUSE    Crossmatch Result      Compatible Performed at [facility],  [address], [place], [place] [address]   Prepare RBC (crossmatch)     Status: None   Collection Time: 01/01/22  7:28 AM  Result Value Ref Range   Order Confirmation      ORDER PROCESSED BY BLOOD BANK Performed at [facility], [place] [address] [place], [place] [address]   Urinalysis, Routine w reflex microscopic In/Out Cath Urine     Status: Abnormal   Collection Time: 01/01/22 11:52 AM  Result Value Ref Range   Color, Urine YELLOW YELLOW   APPearance HAZY (A) CLEAR   Specific Gravity, Urine 1.013 1.005 - 1.030   pH 5.0 5.0 - 8.0   Glucose, UA 50 (A) NEGATIVE mg/dL   Hgb urine dipstick SMALL (A) NEGATIVE   Bilirubin Urine NEGATIVE NEGATIVE   Ketones, ur 5 (A) NEGATIVE mg/dL   Protein, ur 30 (A) NEGATIVE mg/dL   Nitrite NEGATIVE NEGATIVE   Leukocytes,Ua NEGATIVE NEGATIVE   RBC / HPF 0-5 0 - 5 RBC/hpf   WBC, UA 0-5 0 - 5 WBC/hpf   Bacteria, UA RARE (A) NONE SEEN   Squamous Epithelial / LPF 0-5 0 - 5   Mucus PRESENT     Comment: Performed at [facility], [place] [address] [place], [place] [address]  Urine Culture     Status: None   Collection Time: 01/01/22 11:52 AM   Specimen: In/Out Cath Urine  Result Value Ref Range   Specimen Description IN/OUT CATH URINE    Special Requests NONE    Culture      NO GROWTH Performed at [facility], [place] [address] [place],  [address]    Report Status 01/02/2022 FINAL   Glucose, capillary     Status: Abnormal   Collection Time: 01/01/22 12:02 PM  Result Value Ref Range   Glucose-Capillary 245 (H) 70 - 99 mg/dL    Comment: Glucose reference range applies only to samples taken after fasting for at least 8 hours.  Hemoglobin and hematocrit, blood     Status: Abnormal   Collection Time: 01/01/22  1:54 PM  Result Value Ref Range    Hemoglobin 7.5 (L) 12.0 - 15.0 g/dL   HCT 22.1 (L) 36.0 - 46.0 %    Comment: Performed at [facility], [place] [address], [place], [place] [address]  Glucose, capillary     Status: Abnormal   Collection Time: 01/01/22  3:25 PM  Result Value Ref Range   Glucose-Capillary 220 (H) 70 - 99 mg/dL    Comment: Glucose reference range applies only to samples taken after fasting for at least 8 hours.  Glucose, capillary     Status: Abnormal   Collection Time: 01/01/22  9:42 PM  Result Value Ref Range   Glucose-Capillary 218 (H) 70 - 99 mg/dL    Comment: Glucose reference range applies only to samples taken after fasting for at least 8 hours.   Comment 1 Notify RN    Comment 2 Document in Chart   Glucose, capillary     Status: Abnormal   Collection Time: 01/02/22 12:42 AM  Result Value Ref Range   Glucose-Capillary 228 (H) 70 - 99 mg/dL    Comment: Glucose reference range applies only to samples taken after fasting for at least 8 hours.  Blood gas, arterial     Status: Abnormal   Collection Time: 01/02/22 12:52 AM  Result Value Ref Range   pH, Arterial 7.39 7.35 - 7.45   pCO2 arterial  41 32 - 48 mmHg   pO2, Arterial 74 (L) 83 - 108 mmHg   Bicarbonate 24.2 20.0 - 28.0 mmol/L   Acid-base deficit 0.5 0.0 - 2.0 mmol/L   O2 Saturation 95.5 %   Patient temperature 37.9    Collection site RIGHT RADIAL    Drawn by [id number]    Allens test (pass/fail) PASS PASS    Comment: Performed at [facility], [place] [address], [place], [place] [address]  Renal function panel     Status: Abnormal   Collection Time: 01/02/22  2:41 AM  Result Value Ref Range   Sodium 143 135 - 145 mmol/L   Potassium 5.1 3.5 - 5.1 mmol/L   Chloride 110 98 - 111 mmol/L   CO2 17 (L) 22 - 32 mmol/L   Glucose, Bld 257 (H) 70 - 99 mg/dL    Comment: Glucose reference range applies only to samples taken after fasting for at least 8 hours.   BUN 42 (H) 8 - 23 mg/dL   Creatinine, Ser 2.27 (H) 0.44 - 1.00 mg/dL   Calcium 10.9  (H) 8.9 - 10.3 mg/dL   Phosphorus 4.1 2.5 - 4.6 mg/dL   Albumin 3.4 (L) 3.5 - 5.0 g/dL   GFR, Estimated 22 (L) >60 mL/min    Comment: (NOTE) Calculated using the CKD-EPI Creatinine Equation (2021)    Anion gap 16 (H) 5 - 15    Comment: Performed at [facility] [address] [place] [address], [place], [place] [address]  CBC     Status: Abnormal   Collection Time: 01/02/22  2:41 AM  Result Value Ref Range   WBC 32.3 (H) 4.0 - 10.5 K/uL   RBC 2.73 (L) 3.87 - 5.11 MIL/uL   Hemoglobin 7.7 (L) 12.0 - 15.0 g/dL   HCT 22.9 (L) 36.0 - 46.0 %   MCV 83.9 80.0 - 100.0 fL   MCH 28.2 26.0 - 34.0 pg   MCHC 33.6 30.0 - 36.0 g/dL   RDW 19.1 (H) 11.5 - 15.5 %   Platelets 37 (L) 150 - 400 K/uL    Comment: Immature Platelet Fraction may be clinically indicated, consider ordering this additional test [id number] REPEATED TO VERIFY    nRBC 0.5 (H) 0.0 - 0.2 %    Comment: Performed at [facility], [place] [address], [place], [place] [address]  Magnesium     Status: None   Collection Time: 01/02/22  2:41 AM  Result Value Ref Range   Magnesium 1.7 1.7 - 2.4 mg/dL    Comment: Performed at [facility] [address] [place], [place] [address]  Troponin I (High Sensitivity)     Status: Abnormal   Collection Time: 01/02/22  2:41 AM  Result Value Ref Range   Troponin I (High Sensitivity) 105 (HH) <18 ng/L    Comment: CRITICAL RESULT CALLED TO, READ BACK BY AND VERIFIED WITH:  DAline August RN, [phone] 01/02/22 EADEDOKUN (NOTE) Elevated high sensitivity troponin I (hsTnI) values and significant  changes across serial measurements may suggest ACS but many other  chronic and acute conditions are known to elevate hsTnI results.  Refer to the Links section for chest pain algorithms and additional  guidance. Performed at [facility], [place] [address], [place], [place] [address]   Differential     Status: Abnormal   Collection Time: 01/02/22  2:41 AM  Result Value Ref Range   Neutrophils Relative % 17  %   Neutro Abs  5.3 1.7 - 7.7 K/uL   Lymphocytes Relative 40 %   Lymphs Abs 12.8 (H) 0.7 - 4.0 K/uL   Monocytes Relative 41 %   Monocytes Absolute 13.3 (H) 0.1 - 1.0 K/uL   Eosinophils Relative 0 %   Eosinophils Absolute 0.0 0.0 - 0.5 K/uL   Basophils Relative 0 %   Basophils Absolute 0.0 0.0 - 0.1 K/uL   WBC Morphology Abnormal lymphocytes present    Smear Review Normal platelet morphology    Immature Granulocytes 2 %   Abs Immature Granulocytes 0.51 (H) 0.00 - 0.07 K/uL   Polychromasia PRESENT    Target Cells PRESENT     Comment: Performed at [facility] [address] [place], [place] [address]  Save Smear for Provider Slide Review     Status: None   Collection Time: 01/02/22  2:41 AM  Result Value Ref Range   Smear Review SMEAR STAINED AND AVAILABLE FOR REVIEW     Comment: Performed at [facility], [place] [address], [place], [place] [address]  Technologist smear review     Status: None   Collection Time: 01/02/22  2:41 AM  Result Value Ref Range   WBC MORPHOLOGY Abnormal lymphocytes present    RBC MORPHOLOGY TARGET CELLS     Comment: POLYCHROMASIA PRESENT   Tech Review Normal platelet morphology     Comment: Performed at [facility] [address], [place], [place] [address]  Lactic acid, plasma     Status: Abnormal   Collection Time: 01/02/22  4:01 AM  Result Value Ref Range   Lactic Acid, Venous 2.0 (HH) 0.5 - 1.9 mmol/L    Comment: CRITICAL RESULT CALLED TO, READ BACK BY AND VERIFIED WITH:  Bosie Helper RN [phone], 01/02/22, EADEDOKUN Performed at [facility], [place] [address] [place], [place] [address]   DIC Panel ONCE - STAT     Status: Abnormal   Collection Time: 01/02/22  4:10 AM  Result Value Ref Range   Prothrombin Time 18.9 (H) 11.4 - 15.2 seconds   INR 1.6 (H) 0.8 - 1.2    Comment: (NOTE) INR goal varies based on device and disease states.    aPTT 30 24 - 36 seconds   Fibrinogen 299 210 - 475 mg/dL    Comment: (NOTE) Fibrinogen  results may be underestimated in patients receiving thrombolytic therapy.    D-Dimer, Quant 2.34 (H) 0.00 - 0.50 ug/mL-FEU    Comment: (NOTE) At the manufacturer cut-off value of 0.5 g/mL FEU, this assay has a negative predictive value of 95-100%.This assay is intended for use in conjunction with a clinical pretest probability (PTP) assessment model to exclude pulmonary embolism (PE) and deep venous thrombosis (DVT) in outpatients suspected of PE or DVT. Results should be correlated with clinical presentation.    Platelets 72 (L) 150 - 400 K/uL    Comment: Immature Platelet Fraction may be clinically indicated, consider ordering this additional test [id number] REPEATED TO VERIFY    Smear Review NO SCHISTOCYTES SEEN     Comment: Performed at [facility], [place] [address] [place], [place] [address]  Troponin I (High Sensitivity)     Status: Abnormal   Collection Time: 01/02/22  4:10 AM  Result Value Ref Range   Troponin I (High Sensitivity) 117 (HH) <18 ng/L    Comment: CRITICAL VALUE NOTED.  VALUE IS CONSISTENT WITH PREVIOUSLY REPORTED AND CALLED VALUE. (NOTE) Elevated high sensitivity troponin I (hsTnI) values and significant  changes across serial measurements may suggest ACS but many other  chronic and acute conditions are known to elevate hsTnI results.  Refer to the Links section for chest pain algorithms and additional  guidance. Performed at [facility], [place] [address] [place], [place] [address]   Glucose, capillary     Status: Abnormal   Collection Time: 01/02/22  6:36 AM  Result Value Ref Range   Glucose-Capillary 247 (H) 70 - 99 mg/dL    Comment: Glucose reference range applies only to samples taken after fasting for at least 8 hours.  Troponin I (High Sensitivity)     Status: Abnormal   Collection Time: 01/02/22  6:43 AM  Result Value Ref Range   Troponin I (High Sensitivity) 134 (HH) <18 ng/L    Comment: CRITICAL VALUE NOTED.  VALUE IS CONSISTENT  WITH PREVIOUSLY REPORTED AND CALLED VALUE. (NOTE) Elevated high sensitivity troponin I (hsTnI) values and significant  changes across serial measurements may suggest ACS but many other  chronic and acute conditions are known to elevate hsTnI results.  Refer to the Links section for chest pain algorithms and additional  guidance. Performed at [facility], [place] [address] [place], [place] [address]   Troponin I (High Sensitivity)     Status: Abnormal   Collection Time: 01/02/22  8:38 AM  Result Value Ref Range   Troponin I (High Sensitivity) 131 (HH) <18 ng/L    Comment: CRITICAL VALUE NOTED. VALUE IS CONSISTEN WITH PREVIOUSLY REPORTED/CALLED VALUE (NOTE) Elevated high sensitivity troponin I (hsTnI) values and significant  changes across serial measurements may suggest ACS but many other  chronic and acute conditions are known to elevate hsTnI results.  Refer to the "Links" section for chest pain algorithms and additional  guidance. Performed at [facility], [place] [address] [place] [address] [place], [place] [address]   Hemoglobin and hematocrit, blood     Status: Abnormal   Collection Time: 01/02/22  8:38 AM  Result Value Ref Range   Hemoglobin 8.1 (L) 12.0 - 15.0 g/dL   HCT 23.4 (L) 36.0 - 46.0 %    Comment: Performed at [facility], [place] [address], [place],  [address]  Hemoglobin and hematocrit, blood     Status: Abnormal   Collection Time: 01/02/22 10:59 AM  Result Value Ref Range   Hemoglobin 8.0 (L) 12.0 - 15.0 g/dL   HCT 23.2 (L) 36.0 - 46.0 %    Comment: Performed at [facility], [place] [address], [place], [place] [address]  Lactic acid, plasma     Status: Abnormal   Collection Time: 01/02/22 10:59 AM  Result Value Ref Range   Lactic Acid, Venous 2.4 (HH) 0.5 - 1.9 mmol/L    Comment: CRITICAL VALUE NOTED. VALUE IS CONSISTEN WITH PREVIOUSLY REPORTED/CALLED VALUE Performed at [facility], [place] [address] [place], [place] [address]   Glucose,  capillary     Status: Abnormal   Collection Time: 01/02/22 11:34 AM  Result Value Ref Range   Glucose-Capillary 227 (H) 70 - 99 mg/dL    Comment: Glucose reference range applies only to samples taken after fasting for at least 8 hours.    NM Pulmonary Perf and Vent  Result Date: 01/02/2022 CLINICAL DATA:  Tachycardia EXAM: NUCLEAR MEDICINE PERFUSION LUNG SCAN TECHNIQUE: Perfusion images were obtained in multiple projections after intravenous injection of radiopharmaceutical. Ventilation scans intentionally deferred if perfusion scan and chest x-ray adequate for interpretation during COVID 19 epidemic. RADIOPHARMACEUTICALS:  4.3 mCi Tc-61mMAA IV COMPARISON:  Chest radiograph dated January 02, 2022 FINDINGS: No segmental perfusion defects. IMPRESSION: Pulmonary embolism absent (very low probability) Electronically Signed   By: LYetta GlassmanM.D.   On: 01/02/2022 14:23   CT HEAD WO CONTRAST (5MM)  Result Date: 01/02/2022 CLINICAL DATA:  Mental status change, persistent or worsening EXAM: CT HEAD WITHOUT CONTRAST TECHNIQUE: Contiguous axial images were obtained from the base of the skull through the vertex without intravenous contrast. RADIATION DOSE REDUCTION: This exam was performed according to the departmental dose-optimization program which includes automated exposure control, adjustment of the mA and/or kV according to patient size and/or use of iterative reconstruction technique. COMPARISON:  MRI head 05/27/2007 FINDINGS: Brain: No evidence of acute large vascular territory infarction, hemorrhage, hydrocephalus, extra-axial collection or mass lesion/mass effect. Remote appearing small right cerebellar and basal ganglia lacunar infarcts. Moderate patchy white matter hypodensities, nonspecific but compatible with chronic microvascular ischemic disease. Vascular: No hyperdense vessel identified. Calcific intracranial atherosclerosis. Skull: No acute fracture. Sinuses/Orbits: Mild paranasal sinus  mucosal thickening. No acute orbital findings. Other: No mastoid effusions. IMPRESSION: 1. No definite evidence of acute intracranial abnormality 2. Moderate chronic microvascular ischemic disease and remote appearing lacunar infarcts. MRI could provide more sensitive evaluation for acute infarct if clinically warranted. Electronically Signed   By: FMargaretha SheffieldM.D.   On: 01/02/2022 09:15   DG Chest Port 1 View  Result Date: 01/02/2022 CLINICAL DATA:  Acute respiratory distress EXAM: PORTABLE CHEST 1 VIEW COMPARISON:  01/01/2022, 12/25/2021 FINDINGS: Mild cardiomegaly with vascular congestion and mild interstitial edema. Small bilateral effusions. Basilar consolidation on the left. Findings do not appear significantly changed. IMPRESSION: 1. Cardiomegaly with vascular congestion, mild interstitial edema and small pleural effusions 2. Consolidation at the left lung base may be due to atelectasis or pneumonia. Electronically Signed   By: KDonavan FoilM.D.   On: 01/02/2022 01:44   DG CHEST PORT 1 VIEW  Result Date: 01/01/2022 CLINICAL DATA:  200811 decreased pulse ox EXAM: PORTABLE CHEST 1 VIEW COMPARISON:  December 29, 2021 FINDINGS: Heart is borderline. Minor atelectasis at the left lung base. Central pulmonary vascular congestion and is more prominent in the present study. The visualized skeletal structures are unremarkable. IMPRESSION: Heart is borderline. Central pulmonary vascular congestion. Left basilar minor atelectasis. Electronically Signed   By: AFrazier RichardsM.D.   On: 01/01/2022 11:09   ECHOCARDIOGRAM COMPLETE  Result Date: 12/31/2021    ECHOCARDIOGRAM REPORT   Patient Name[facility]   [facility]: [facility]21 Medical Rec #:  [id number]         Height:       67.0 in Accession #:    [id number]        Weight:       198.4 lb Date of Birth:  101-21-1947         BSA:          2.015 m Patient Age:    89years           BP:           126/48 mmHg Patient Gender: F                  HR:            96 bpm. Exam Location:  Inpatient Procedure: 2D Echo, Cardiac Doppler and Color Doppler Indications:    R06.00 Dyspnea  History:        Patient has  no prior history of Echocardiogram examinations.                 Risk Factors:Hypertension, Diabetes and Dyslipidemia. Hx of                 COVID-19.  Sonographer:    Alvino Chapel RCS Referring Phys: Bartholomew  1. Left ventricular ejection fraction, by estimation, is 70 to 75%. The left ventricle has hyperdynamic function. The left ventricle has no regional wall motion abnormalities. Left ventricular diastolic parameters are consistent with Grade I diastolic dysfunction (impaired relaxation).  2. Right ventricular systolic function is normal. The right ventricular size is normal. Tricuspid regurgitation signal is inadequate for assessing PA pressure.  3. Left atrial size was severely dilated.  4. The mitral valve is abnormal. Trivial mitral valve regurgitation.  5. The aortic valve is tricuspid. Aortic valve regurgitation is not visualized. Aortic valve sclerosis/calcification is present, without any evidence of aortic stenosis.  6. The inferior vena cava is dilated in size with >50% respiratory variability, suggesting right atrial pressure of 8 mmHg. Comparison(s): No prior Echocardiogram. FINDINGS  Left Ventricle: Left ventricular ejection fraction, by estimation, is 70 to 75%. The left ventricle has hyperdynamic function. The left ventricle has no regional wall motion abnormalities. The left ventricular internal cavity size was normal in size. There is no left ventricular hypertrophy. Left ventricular diastolic parameters are consistent with Grade I diastolic dysfunction (impaired relaxation). Indeterminate filling pressures. Right Ventricle: The right ventricular size is normal. No increase in right ventricular wall thickness. Right ventricular systolic function is normal. Tricuspid regurgitation signal is inadequate for assessing PA  pressure. Left Atrium: Left atrial size was severely dilated. Right Atrium: Right atrial size was normal in size. Pericardium: There is no evidence of pericardial effusion. Mitral Valve: The mitral valve is abnormal. Mild mitral annular calcification. Trivial mitral valve regurgitation. Tricuspid Valve: The tricuspid valve is grossly normal. Tricuspid valve regurgitation is not demonstrated. Aortic Valve: The aortic valve is tricuspid. Aortic valve regurgitation is not visualized. Aortic valve sclerosis/calcification is present, without any evidence of aortic stenosis. Aortic valve mean gradient measures 9.5 mmHg. Aortic valve peak gradient measures 20.2 mmHg. Aortic valve area, by VTI measures 2.48 cm. Pulmonic Valve: The pulmonic valve was grossly normal. Pulmonic valve regurgitation is trivial. Aorta: The aortic root and ascending aorta are structurally normal, with no evidence of dilitation. Venous: The inferior vena cava is dilated in size with greater than 50% respiratory variability, suggesting right atrial pressure of 8 mmHg. IAS/Shunts: No atrial level shunt detected by color flow Doppler.  LEFT VENTRICLE PLAX 2D LVIDd:         4.70 cm   Diastology LVIDs:         2.70 cm   LV e' medial:    7.83 cm/s LV PW:         1.00 cm   LV E/e' medial:  20.1 LV IVS:        1.00 cm   LV e' lateral:   10.00 cm/s LVOT diam:     1.90 cm   LV E/e' lateral: 15.7 LV SV:         104 LV SV Index:   52 LVOT Area:     2.84 cm  RIGHT VENTRICLE RV S prime:     17.60 cm/s TAPSE (M-mode): 2.3 cm LEFT ATRIUM              Index  RIGHT ATRIUM           Index LA diam:        3.20 cm  1.59 cm/m   RA Area:     17.30 cm LA Vol (A2C):   94.6 ml  46.94 ml/m  RA Volume:   44.90 ml  22.28 ml/m LA Vol (A4C):   110.0 ml 54.58 ml/m LA Biplane Vol: 113.0 ml 56.07 ml/m  AORTIC VALVE AV Area (Vmax):    2.08 cm AV Area (Vmean):   2.27 cm AV Area (VTI):     2.48 cm AV Vmax:           224.50 cm/s AV Vmean:          138.500 cm/s AV VTI:             0.421 m AV Peak Grad:      20.2 mmHg AV Mean Grad:      9.5 mmHg LVOT Vmax:         165.00 cm/s LVOT Vmean:        111.000 cm/s LVOT VTI:          0.368 m LVOT/AV VTI ratio: 0.87  AORTA Ao Root diam: 3.30 cm MITRAL VALVE MV Area (PHT): 3.08 cm     SHUNTS MV Decel Time: 246 msec     Systemic VTI:  0.37 m MV E velocity: 157.00 cm/s  Systemic Diam: 1.90 cm MV A velocity: 150.00 cm/s MV E/A ratio:  1.05 Lyman Bishop MD Electronically signed by Lyman Bishop MD Signature Date/Time: 12/31/2021/3:32:02 PM    Final     Review of Systems  Unable to perform ROS: Mental status change   Blood pressure 95/74, pulse (!) 163, temperature (!) 102.2 F (39 C), temperature source Axillary, resp. rate (!) 22, height '5\' 7"'$  (1.702 m), weight 94.2 kg, SpO2 92 %. Physical Exam HENT:     Head: Normocephalic and atraumatic.  Eyes:     Conjunctiva/sclera: Conjunctivae normal.     Pupils: Pupils are equal, round, and reactive to light.  Cardiovascular:     Comments: Tachycardic to 2 1 atrial flutter heart rate in 160s S1-S2 soft Pulmonary:     Comments: Decreased breath sound at bases clear anterolaterally Abdominal:     General: There is distension.     Palpations: Abdomen is soft. There is no mass.  Musculoskeletal:     Cervical back: Neck supple. No rigidity.  Skin:    General: Skin is warm and dry.     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 we will give IV Lopressor 5 mg every 4 hours as needed for heart rate above 110, and will switch to p.o. once able to take Patient not a candidate for flutter ablation due to various comorbidities and critical condition and patient is DNR.  We will also not consider cardioversion  as her heart rate is better controlled with beta-blockers and hopefully may convert back to sinus rhythm.  Patient will need long-term anticoagulants.  Zada Finders 01/02/2022, 3:06 PM

## 2022-01-03 DIAGNOSIS — R6510 Systemic inflammatory response syndrome (SIRS) of non-infectious origin without acute organ dysfunction: Secondary | ICD-10-CM | POA: Diagnosis not present

## 2022-01-03 LAB — BLOOD CULTURE ID PANEL (REFLEXED) - BCID2

## 2022-01-03 LAB — CBC
HCT: 21.9 % — ABNORMAL LOW (ref 36.0–46.0)
Hemoglobin: 7.8 g/dL — ABNORMAL LOW (ref 12.0–15.0)
MCH: 28.7 pg (ref 26.0–34.0)
MCHC: 35.6 g/dL (ref 30.0–36.0)
MCV: 80.5 fL (ref 80.0–100.0)
Platelets: 73 10*3/uL — ABNORMAL LOW (ref 150–400)
RBC: 2.72 MIL/uL — ABNORMAL LOW (ref 3.87–5.11)
RDW: 19.8 % — ABNORMAL HIGH (ref 11.5–15.5)
WBC: 40.8 10*3/uL — ABNORMAL HIGH (ref 4.0–10.5)
nRBC: 0.9 % — ABNORMAL HIGH (ref 0.0–0.2)

## 2022-01-03 LAB — RENAL FUNCTION PANEL
Albumin: 3 g/dL — ABNORMAL LOW (ref 3.5–5.0)
Anion gap: 11 (ref 5–15)
BUN: 49 mg/dL — ABNORMAL HIGH (ref 8–23)
CO2: 22 mmol/L (ref 22–32)
Calcium: 10.5 mg/dL — ABNORMAL HIGH (ref 8.9–10.3)
Chloride: 111 mmol/L (ref 98–111)
Creatinine, Ser: 2.41 mg/dL — ABNORMAL HIGH (ref 0.44–1.00)
GFR, Estimated: 21 mL/min — ABNORMAL LOW (ref >60)
Glucose, Bld: 181 mg/dL — ABNORMAL HIGH (ref 70–99)
Phosphorus: 3.5 mg/dL (ref 2.5–4.6)
Potassium: 4.3 mmol/L (ref 3.5–5.1)
Sodium: 144 mmol/L (ref 135–145)

## 2022-01-03 LAB — GLUCOSE, CAPILLARY: Glucose-Capillary: 170 mg/dL — ABNORMAL HIGH (ref 70–99)

## 2022-01-03 LAB — CULTURE, BLOOD (ROUTINE X 2)
Culture: NO GROWTH
Culture: NO GROWTH
Special Requests: ADEQUATE

## 2022-01-03 LAB — HEPARIN LEVEL (UNFRACTIONATED): Heparin Unfractionated: 0.51 IU/mL (ref 0.30–0.70)

## 2022-01-03 MED ORDER — LORAZEPAM 2 MG/ML IJ SOLN: 1.0000 mg | INTRAMUSCULAR | Status: DC | PRN

## 2022-01-03 MED ORDER — MORPHINE SULFATE (PF) 2 MG/ML IV SOLN
1.0000 mg | INTRAVENOUS | Status: DC | PRN
  Administered 2022-01-03 (×2): 1 mg via INTRAVENOUS
  Filled 2022-01-03: qty 1

## 2022-01-03 MED ORDER — HALOPERIDOL LACTATE 2 MG/ML PO CONC
0.5000 mg | ORAL | Status: DC | PRN
  Filled 2022-01-03: qty 5

## 2022-01-03 MED ORDER — MORPHINE 100MG IN NS 100ML (1MG/ML) PREMIX INFUSION
2.0000 mg/h | INTRAVENOUS | Status: DC
  Administered 2022-01-03: 2 mg/h via INTRAVENOUS
  Filled 2022-01-03: qty 100

## 2022-01-03 MED ORDER — LORAZEPAM 2 MG/ML PO CONC
1.0000 mg | ORAL | Status: DC | PRN
  Filled 2022-01-03: qty 0.5

## 2022-01-03 MED ORDER — LORAZEPAM 1 MG PO TABS: 1.0000 mg | ORAL_TABLET | ORAL | Status: DC | PRN

## 2022-01-03 MED ORDER — POLYVINYL ALCOHOL 1.4 % OP SOLN
1.0000 [drp] | Freq: Four times a day (QID) | OPHTHALMIC | Status: DC | PRN
  Filled 2022-01-03: qty 15

## 2022-01-03 MED ORDER — ACETAMINOPHEN 650 MG RE SUPP: 650.0000 mg | Freq: Four times a day (QID) | RECTAL | Status: DC | PRN

## 2022-01-03 MED ORDER — GLYCOPYRROLATE 0.2 MG/ML IJ SOLN
0.2000 mg | INTRAMUSCULAR | Status: DC | PRN
  Administered 2022-01-03: 0.2 mg via INTRAVENOUS
  Filled 2022-01-03: qty 1

## 2022-01-03 MED ORDER — GLYCOPYRROLATE 0.2 MG/ML IJ SOLN
0.4000 mg | INTRAMUSCULAR | Status: DC
  Administered 2022-01-03 – 2022-01-04 (×4): 0.4 mg via INTRAVENOUS
  Filled 2022-01-03 (×4): qty 2

## 2022-01-03 MED ORDER — HALOPERIDOL LACTATE 5 MG/ML IJ SOLN: 0.5000 mg | INTRAMUSCULAR | Status: DC | PRN

## 2022-01-03 MED ORDER — BIOTENE DRY MOUTH MT LIQD: 15.0000 mL | OROMUCOSAL | Status: DC | PRN

## 2022-01-03 MED ORDER — POLYVINYL ALCOHOL 1.4 % OP SOLN: 1.0000 [drp] | Freq: Four times a day (QID) | OPHTHALMIC | Status: DC | PRN

## 2022-01-03 MED ORDER — MORPHINE BOLUS VIA INFUSION: 1.0000 mg | INTRAVENOUS | Status: DC | PRN

## 2022-01-03 MED ORDER — HALOPERIDOL 0.5 MG PO TABS
0.5000 mg | ORAL_TABLET | ORAL | Status: DC | PRN
  Filled 2022-01-03: qty 1

## 2022-01-03 MED ORDER — MORPHINE BOLUS VIA INFUSION
1.0000 mg | INTRAVENOUS | Status: DC | PRN
  Administered 2022-01-03 – 2022-01-04 (×3): 1 mg via INTRAVENOUS

## 2022-01-03 NOTE — Progress Notes (Signed)
Patient [id number] [name]      DOB: January 06, 1946      INO:[id number]      Palliative Medicine Team      This RN provided bedside symptom check after patient converted to comfort measures earlier today. Patient's breathing irregular with some increased effort noted. Snoring respirations heard with audible oral secretions. Open mouthed breathing with dry tongue and membranes noted. Family providing oral care and lip moisturizer during my visit, they endorse that she has always been a mouth breather. This RN administered PRN medications and increased morphine infusion (see eMAR). O2 turned down from 14L to 12L humidified high flow nasal cannula. RN made aware. Patient with faint distal pulses palpated, all extremities warm to touch. Patient does not acknowledge this RN bedside with verbal or light tactile stimulation. Family endorses that patient intermittently furrowing brow and turning head but not opening eyes or vocalizing. PA Josseline Cooper made aware of assessment. Anticipate patient nearing end of life. All questions answered for family, many family members visiting bedside.   Thank you for allowing the Palliative Medicine Team to assist in the care of this patient.     Damian Leavell, MSN, RN Palliative Medicine Team Team Phone: [phone]  This phone is monitored 7a-7p, please reach out to attending physician outside of these hours for urgent needs.

## 2022-01-03 NOTE — Progress Notes (Signed)
OT Cancellation Note  Patient Details Name: [name] MRN: [id number] DOB: 05/11/1946   Cancelled Treatment:    Reason Eval/Treat Not Completed: Other (comment);Medical issues which prohibited therapy Discussed status of pt with RN this AM regarding potential plans for pt to transition to comfort care, RN reports pt was currently in significant pain and was not appropriate for OT services. OT help this am at 0858, with new order placed per MD for cancellation of Orders. OT will sign off at this time  Maelle Sheaffer OTR/L acute rehab services Office: [place] 01/03/2022, 11:45 AM

## 2022-01-03 NOTE — Progress Notes (Addendum)
This chaplain responded to Dr. Daryll Drown consult for EOL spiritual care and prayer.  The Pt. son-Don and daughter-Alia are at the bedside at the time of the visit. The chaplain understands there is another son in [place].  Dr. Daryll Drown is present and confirms the Pt. transition to comfort measures.with the Pt. children. The chaplain paged PMT PA-Josseline and supports the family with the opportunity to participate in grief care through story telling.  The chaplain understands the Pt. grandchildren brighten her day, along with her [place].  Per the family request, the chaplain will return for prayer with the family later this afternoon.  **[id number] This chaplain is present for F/U EOL spiritual care. The Pt. nieces, sister, and grandchildren are at the Pt. bedside peacefully expressing their disbelief in the rapid shift of the Pt. health. The children shared a few memories of their most recent visit with the Pt. The chaplain answered the Pt. daughter's questions about Pt. comfort; swabbing the Pt. mouth and applying a moisturizer to the Pt. lips.   The chaplain is available for prayer with the family as needed.  Chaplain Sallyanne Kuster [phone]

## 2022-01-03 NOTE — Progress Notes (Signed)
Daily Progress Note   Patient Name: [name]       Date: 01/03/2022 DOB: Jan 06, 1946  Age: 76 y.o. MRN#: [id number] Attending Physician: Sid Falcon, MD Primary Care Physician: Axel Filler, MD Admit Date: 12/24/2021  Reason for Consultation/Follow-up: Establishing goals of care  Subjective: AM: Medical records reviewed including labs, imaging, progress note. Patient assessed at the bedside.  She appears uncomfortable, agitated.  Discussed with RN.  Her son and daughter at the bedside visiting.  Patient is requesting water.  Created space and opportunity for family's thoughts and feelings on her current illness.  They confirmed that after receiving updates from the medical team and learning of patient's aggressive leukemia, they are ready for transition to comfort care today.  They are still shocked by this and her son Timmothy Sours shares that this was supposed to be like any other hospital stay where she can recover to her baseline.  He again states "she was talking when she first got here."  Patient's daughter is wondering about prognosis and we discussed likely hours to days.  She is tearful and hopes that patient will not pass on her birthday Saturday.  We discussed details of comfort care, emphasizing that patient would no longer receive aggressive medical interventions such as continuous vital signs, lab work, radiology testing, or medications not focused on comfort. All care would focus on how the patient is looking and feeling. This would include management of any symptoms that may cause discomfort, pain, shortness of breath, cough, nausea, agitation, anxiety, and/or secretions etc. Symptoms would be managed with medications and other non-pharmacological interventions such as  spiritual support if requested, repositioning, music therapy, or therapeutic listening. Family verbalized understanding and appreciation.   I recommended morphine drip and titration of oxygen to nasal cannula as tolerated.  Family is agreeable.   PM: Return to the bedside after morphine drip was started and patient seems more comfortable but did not enter the room as she was surrounded by several family members.  Discussed with PMT RN and increased bolus frequency and scheduled Robinul for excessive secretions.  Questions and concerns addressed. PMT will continue to support holistically.   Length of Stay: 4   Physical Exam Vitals and nursing note reviewed.  Constitutional:      General: She is sleeping.  Appearance: She is ill-appearing.     Interventions: Face mask in place.  Cardiovascular:     Rate and Rhythm: Normal rate.  Pulmonary:     Effort: Pulmonary effort is normal.  Neurological:     Mental Status: She is unresponsive.  Psychiatric:        Behavior: Behavior is agitated.             Vital Signs: BP (!) 142/54 (BP Location: Right Arm)   Pulse 97   Temp 99.2 F (37.3 C) (Oral)   Resp (!) 22   Ht '5\' 7"'$  (1.702 m)   Wt 92.1 kg   SpO2 93%   BMI 31.80 kg/m  SpO2: SpO2: 93 % O2 Device: O2 Device: Nasal Cannula (with salter.) O2 Flow Rate: O2 Flow Rate (L/min): 12 L/min      Palliative Assessment/Data: 10%      Palliative Care Assessment & Plan   Patient Profile: 76 y.o. female  with past medical history of  type 2 diabetes, renal transplant in 2010, abdominal aortic aneurysm, hypertension admitted on 01/17/2022 with generalized aches and pains, weakness for 3 weeks and poor appetite and decreased thirst for approximately 1 week.    Today patient is in A-fib with RVR, rate controlled after IV metoprolol.  She has AKI on CKD 3 and meets SIRS criteria, although more concerning for malignancy than infection.  PMT has been consulted to assist with goals of care  conversation.  Assessment: Goals of care conversation Status post renal transplant A-fib with RVR AKI on CKD 3 Plasma cell leukemia  Recommendations/Plan: DNR Transition to comfort care today Morphine drip with as needed boluses for pain/air hunger/comfort Robinul scheduled for excessive secretions Ativan PRN for agitation/anxiety Zofran PRN for nausea Liquifilm tears PRN for dry eyes Haldol PRN for agitation/anxiety May have comfort feeding Comfort cart for family Unrestricted visitations in the setting of EOL (per policy) Wean oxygen to PRN 2L or less for comfort. No escalation.     Prognosis:  Hours - Days  Discharge Planning: Anticipated Hospital Death  Care plan was discussed with IMTS, RN, patient's son, patient's daughter, chaplain Sallyanne Kuster   MDM high         [place], PA-C  Palliative Medicine Team Team phone # [phone]  Thank you for allowing the Palliative Medicine Team to assist in the care of this patient. Please utilize secure chat with additional questions, if there is no response within 30 minutes please call the above phone number.  Palliative Medicine Team providers are available by phone from 7am to 7pm daily and can be reached through the team cell phone.  Should this patient require assistance outside of these hours, please call the patient's attending physician.

## 2022-01-03 NOTE — Progress Notes (Signed)
PHARMACY - PHYSICIAN COMMUNICATION CRITICAL VALUE ALERT - BLOOD CULTURE IDENTIFICATION (BCID)  [name] is an 76 y.o. female who presented to [facility] on 01/22/2022 with a chief complaint of generalized pain.  Assessment:  Complex hospitalization, resumed on ABX for SIRS, now growing Staph spp in 1 of 2 blood cx bottles, likely contaminant.  Name of physician (or Provider) ContactedJewel Baize MD  Current antibiotics: vancomycin and ceftriaxone  Changes to prescribed antibiotics recommended:  No changes necessary for now.  Results for orders placed or performed during the hospital encounter of 01/19/2022  Blood Culture ID Panel (Reflexed) (Collected: 01/02/2022  2:41 AM)  Result Value Ref Range   Enterococcus faecalis NOT DETECTED NOT DETECTED   Enterococcus Faecium NOT DETECTED NOT DETECTED   Listeria monocytogenes NOT DETECTED NOT DETECTED   Staphylococcus species DETECTED (A) NOT DETECTED   Staphylococcus aureus (BCID) NOT DETECTED NOT DETECTED   Staphylococcus epidermidis NOT DETECTED NOT DETECTED   Staphylococcus lugdunensis NOT DETECTED NOT DETECTED   Streptococcus species NOT DETECTED NOT DETECTED   Streptococcus agalactiae NOT DETECTED NOT DETECTED   Streptococcus pneumoniae NOT DETECTED NOT DETECTED   Streptococcus pyogenes NOT DETECTED NOT DETECTED   A.calcoaceticus-baumannii NOT DETECTED NOT DETECTED   Bacteroides fragilis NOT DETECTED NOT DETECTED   Enterobacterales NOT DETECTED NOT DETECTED   Enterobacter cloacae complex NOT DETECTED NOT DETECTED   Escherichia coli NOT DETECTED NOT DETECTED   Klebsiella aerogenes NOT DETECTED NOT DETECTED   Klebsiella oxytoca NOT DETECTED NOT DETECTED   Klebsiella pneumoniae NOT DETECTED NOT DETECTED   Proteus species NOT DETECTED NOT DETECTED   Salmonella species NOT DETECTED NOT DETECTED   Serratia marcescens NOT DETECTED NOT DETECTED   Haemophilus influenzae NOT DETECTED NOT DETECTED   Neisseria meningitidis NOT  DETECTED NOT DETECTED   Pseudomonas aeruginosa NOT DETECTED NOT DETECTED   Stenotrophomonas maltophilia NOT DETECTED NOT DETECTED   Candida albicans NOT DETECTED NOT DETECTED   Candida auris NOT DETECTED NOT DETECTED   Candida glabrata NOT DETECTED NOT DETECTED   Candida krusei NOT DETECTED NOT DETECTED   Candida parapsilosis NOT DETECTED NOT DETECTED   Candida tropicalis NOT DETECTED NOT DETECTED   Cryptococcus neoformans/gattii NOT DETECTED NOT DETECTED    Wynona Neat, PharmD, BCPS  01/03/2022  2:51 AM

## 2022-01-03 NOTE — Progress Notes (Signed)
Nephrology quick note:   Reviewed chart this AM - new diagnosis of plasma cell leukemia - oncology has consulted and notes this is a highly aggressive disease and in hospital death is likely as her clinical status does not allow attempt to treat.  Comfort measures have been recommended.  I don't have anything to add from a nephrology perspective and will sign off.  Please contact me if I can be of assistance.  I didn't see the patient today.

## 2022-01-03 NOTE — Progress Notes (Signed)
Subjective:   Summary: [name] is a 76 y.o. year old female currently admitted on the IMTS HD#4 for new diagnosis of plasma cell leukemia and A-fib with RVR.  Overnight Events: - Heart rates well controlled overnight on IV metoprolol, of note last received 2141 last night.  Converted overnight to normal sinus rhythm on amiodarone drip - Oncology discussed prognosis of the recent diagnosis of plasma cell leukemia with family yesterday.  They understand that patient is not a candidate for chemotherapy. -Family decided to transition to comfort care measures today.  We discussed as a team with the assistance of palliative care and chaplain present.  Objective:  Vital signs in last 24 hours: Vitals:   01/03/22 0130 01/03/22 0200 01/03/22 0441 01/03/22 0559  BP: (!) 131/50 (!) 138/53 (!) 135/43 (!) 144/52  Pulse: 91 94 97 97  Resp: (!) '22 20 20 20  '$ Temp: (!) 100.5 F (38.1 C) (!) 100.5 F (38.1 C) 100.1 F (37.8 C) 98.7 F (37.1 C)  TempSrc: Rectal Rectal Rectal Axillary  SpO2: 94% 95% 94% 96%  Weight:      Height:       Supplemental O2: Venturi Mask SpO2: 96 % O2 Flow Rate (L/min): 14 L/min FiO2 (%): 55 %   Physical Exam:  Constitutional: Elderly appearing woman lying in bed, in no acute distress.  Somnolent, but answers questions if prompted multiple times. CV: Tachycardic, regular.  2/6 systolic ejection murmur, no LE edema Pulmonary: Increased respiratory effort with Venturi mask on.   Abdominal: Soft, nontender, nondistended Extremities: No lower extremity edema present SCDs on Skin: Warm and dry, multiple sites of ecchymosis and purpura on abdomen, upper extremities, and lower extremities bilaterally  Filed Weights   01/01/22 0439 01/02/22 0408 01/03/22 0100  Weight: 88.6 kg 94.2 kg 92.1 kg     Intake/Output Summary (Last 24 hours) at 01/03/2022 0715 Last data filed at 01/03/2022 0220 Gross per 24 hour  Intake 552.24 ml  Output 1890 ml   Net -1337.76 ml   Net IO Since Admission: 2,820.04 mL [01/03/22 0715]  Pertinent Labs:    Latest Ref Rng & Units 01/03/2022    1:59 AM 01/02/2022    7:47 PM 01/02/2022   10:59 AM  CBC  WBC 4.0 - 10.5 K/uL 40.8  39.3    Hemoglobin 12.0 - 15.0 g/dL 7.8  7.6  8.0   Hematocrit 36.0 - 46.0 % 21.9  22.2  23.2   Platelets 150 - 400 K/uL 73  77         Latest Ref Rng & Units 01/03/2022    1:59 AM 01/02/2022    2:41 AM 01/01/2022    3:11 AM  CMP  Glucose 70 - 99 mg/dL 181  257  249   BUN 8 - 23 mg/dL 49  42  37   Creatinine 0.44 - 1.00 mg/dL 2.41  2.27  2.40   Sodium 135 - 145 mmol/L 144  143  138   Potassium 3.5 - 5.1 mmol/L 4.3  5.1  4.9   Chloride 98 - 111 mmol/L 111  110  108   CO2 22 - 32 mmol/L '22  17  16   '$ Calcium 8.9 - 10.3 mg/dL 10.5  10.9  10.7    Pathology positive for plasma cell leukemia  Imaging:   EKG: Patient is on continuous telemetry, has converted to normal sinus rhythm overnight,  with rates well controlled and not in RVR  Assessment/Plan:   Principal Problem:   SIRS (systemic inflammatory response syndrome) (HCC) Active Problems:   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 with a pertinent PMH of type 2 diabetes with neuropathy, renal transplant in 2010, AAA status post iliac stent, hypothyroidism,osteoarthritis, HLD, and CKD 3, who presented with general aches and malaise and poor appetite and initially admitted for SIRS criteria, now with new diagnosis of plasma cell leukemia.  Family has decided to transition to comfort care measures today.  #Plasma cell leukemia #New onset A-fib with RVR #AKI on CKD 3 #Hx of renal transplant #T2DM, uncontrolled #Diabetic neuropathy #Dyspnea on exertion #Abdominal aortic aneurysm #Aorto-iliac stent #Hypertension: #Hyperlipidemia: Patient continues to have profound leukocytosis, anemia, thrombocytopenia with  platelets stable at 73.  As per yesterday, patient has new diagnosis of plasma cell leukemia according to pathology. Patient has remained in NSR for past 12 hrs with well-controlled rates on IV  Our team and oncology have discussed this diagnosis with the family and the patient's poor prognosis, as well as the fact that she is not a candidate for chemotherapy at this time.  Family has discussed with palliative care and has decided to transition to comfort care.   We have removed the following treatments as they would interfere with the patient's comfort: - Stopped heparin, vancomycin, amiodarone, ceftriaxone infusions. - Normal labs or CBGs/insulin. - Discontinued any p.o. medications that would not facilitate comfort - Discontinued continuous monitoring - removed most IVs and limit further sticks  We have added on the following plan in conjunction with palliative care to improve the patient's comfort: - Remain DNR status - Continue IV metoprolol as needed to control rate and limit discomfort. - Pain control with Tylenol  suppository every 6 as needed, morphine infusion, morphine bolus every 15 minutes as needed for uncontrolled pain, continue muscle rub Crea as needed for muscle pain - Transition to nasal cannula off Venturi mask.  Goal saturation, for comfort only.  Benzos and/or opioids for shortness of breath, dyspnea or increased work of breathing and call PMT for uncontrolled symptoms. - Diet with ice chips and comfort foods as requested. - Haldol as needed for agitation or delirium, Ativan as needed for anxiety - Zofran as needed for nausea - Other comfort measures as follows: Senokot for mild constipation, eyedrops as needed for dry eyes, Robitussin as needed for cough, Robinul as needed for excessive secretions, antiseptic oral rinse as needed for dry mouth. - Assessed pain every 2 hours - Palliative care continues to be on board with assistance from chaplain as well.  We will continue to  try to address family's needs  Disposition: DNR with comfort care measures.  Expected hospital death within days to weeks  Linus Galas, MD PGY-1 Internal Medicine Resident Please contact the on call pager after 5 pm and on weekends at [phone].

## 2022-01-03 NOTE — Progress Notes (Signed)
ANTICOAGULATION CONSULT NOTE - Follow Up Consult  Pharmacy Consult for Heparin Indication: atrial fibrillation and rule out PE  Allergies  Allergen Reactions   Hectorol [Doxercalciferol] Anaphylaxis and Shortness Of Breath   Latex Anaphylaxis and Other (See Comments)    discharge   Other Rash    Thin toilet tissue and medical paper table covering causes rash.   Vitamin D Analogs Anaphylaxis and Shortness Of Breath    Could not catch breath and had bad reaction   Coumadin [Warfarin Sodium] Other (See Comments)    Hemorhage. Has IVC filter   Lisinopril Cough    Patient Measurements: Height: '5\' 7"'$  (170.2 cm) Weight: 92.1 kg (203 lb 0.7 oz) IBW/kg (Calculated) : 61.6 Heparin Dosing Weight: 82.7 kg  Vital Signs: Temp: 99.2 F (37.3 C) (07/12 0723) Temp Source: Oral (07/12 0723) BP: 142/54 (07/12 0723) Pulse Rate: 97 (07/12 0723)  Labs: Recent Labs    01/01/22 0311 01/01/22 3295 01/02/22 0241 01/02/22 0410 01/02/22 0643 01/02/22 0838 01/02/22 1059 01/02/22 1947 01/03/22 0159  HGB  --    < > 7.7*  --   --  8.1* 8.0* 7.6* 7.8*  HCT  --    < > 22.9*  --   --  23.4* 23.2* 22.2* 21.9*  PLT  --    < > 37* 72*  --   --   --  77* 73*  APTT  --   --   --  30  --   --   --   --   --   LABPROT  --   --   --  18.9*  --   --   --   --   --   INR  --   --   --  1.6*  --   --   --   --   --   HEPARINUNFRC  --   --   --   --   --   --   --  0.54 0.51  CREATININE 2.40*  --  2.27*  --   --   --   --   --  2.41*  TROPONINIHS  --    < > 105* 117* 134* 131*  --   --   --    < > = values in this interval not displayed.    Estimated Creatinine Clearance: 23.9 mL/min (A) (by C-G formula based on SCr of 2.41 mg/dL (H)).  Assessment:  76 yr old female started on IV heparin on 01/02/22 for atrial fibrillation and concern for PE (VQ scan low probability on 7/11). SQ heparin for VTE prophylaxis was stopped on 7/10.  Low platelet count, Hgb low but improved after 1 unit PRBCs given on 7/10.   Discussed with Dr. Jodell Cipro on 01/02/22. Will target low therapeutic heparin levels and avoid boluses.  Monitor Hgb and platelet count for significant drop and possible need to hold heparin.   Heparin level is 0.51 on 800 units/hr.  Just at upper end of low therapeutic goal range. Hgb 7.8 and platelet count 73, both low stable. No bleeding reported.  Noted heme/onc recommendation to hold heparin if Hgb < 7 or Plts < 50K, and to stop IV heparin when feasible.    Goal of Therapy:  Heparin level 0.3-0.5 units/ml Monitor platelets by anticoagulation protocol: Yes   Plan:  Continue heparin drip at 800 units/hr Daily heparin level and CBC while on heparin. Follow up anticoagulation plans. Monitor for signs/symptoms of bleeding.  Arty Baumgartner, [place] 01/03/2022,9:26 AM

## 2022-01-03 NOTE — Progress Notes (Signed)
Subjective:  Patient denies any complaints.  Converted back to sinus rhythm.  Events of yesterday noted.  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  Physical Exam: Neck: no adenopathy, no carotid bruit, no JVD, and supple, symmetrical, trachea midline Lungs: clear anterolaterally Abdomen: soft, non-tender; bowel sounds normal; no masses,  no organomegaly Extremities: extremities normal, atraumatic, no cyanosis or edema  Lab Results: Recent Labs    01/02/22 1947 01/03/22 0159  WBC 39.3* 40.8*  HGB 7.6* 7.8*  PLT 77* 73*   Recent Labs    01/02/22 0241 01/03/22 0159  NA 143 144  K 5.1 4.3  CL 110 111  CO2 17* 22  GLUCOSE 257* 181*  BUN 42* 49*  CREATININE 2.27* 2.41*   No results for input(s): "TROPONINI" in the last 72 hours.  Invalid input(s): "CK", "MB" Hepatic Function Panel Recent Labs    01/03/22 0159  ALBUMIN 3.0*   No results for input(s): "CHOL" in the last 72 hours. No results for input(s): "PROTIME" in the last 72 hours.  Imaging: Imaging results have been reviewed and NM Pulmonary Perf and Vent  Result Date: 01/02/2022 CLINICAL DATA:  Tachycardia EXAM: NUCLEAR MEDICINE PERFUSION LUNG SCAN TECHNIQUE: Perfusion images were obtained in multiple projections after intravenous injection of radiopharmaceutical. Ventilation scans intentionally deferred if perfusion scan and chest x-ray adequate for interpretation during COVID 19 epidemic.  RADIOPHARMACEUTICALS:  4.3 mCi Tc-14mMAA IV COMPARISON:  Chest radiograph dated January 02, 2022 FINDINGS: No segmental perfusion defects. IMPRESSION: Pulmonary embolism absent (very low probability) Electronically Signed   By: LYetta GlassmanM.D.   On: 01/02/2022 14:23   CT HEAD WO CONTRAST (5MM)  Result Date: 01/02/2022 CLINICAL DATA:  Mental status change, persistent or worsening EXAM: CT HEAD WITHOUT CONTRAST TECHNIQUE: Contiguous axial images were obtained from the base of the skull through the vertex without intravenous contrast. RADIATION DOSE REDUCTION: This exam was performed according to the departmental dose-optimization program which includes automated exposure control, adjustment of the mA and/or kV according to patient size and/or use of iterative reconstruction technique. COMPARISON:  MRI head 05/27/2007 FINDINGS: Brain: No evidence of acute large vascular territory infarction, hemorrhage, hydrocephalus, extra-axial collection or mass lesion/mass effect. Remote appearing small right cerebellar and basal ganglia lacunar infarcts. Moderate patchy white matter hypodensities, nonspecific but compatible with chronic microvascular ischemic disease. Vascular: No hyperdense vessel identified. Calcific intracranial atherosclerosis. Skull: No acute fracture. Sinuses/Orbits: Mild paranasal sinus mucosal thickening. No acute orbital findings. Other: No mastoid effusions. IMPRESSION: 1. No definite evidence of acute intracranial abnormality 2. Moderate chronic microvascular ischemic disease and remote appearing lacunar infarcts. MRI could provide more sensitive evaluation for acute infarct if clinically warranted. Electronically Signed   By: FMargaretha SheffieldM.D.   On: 01/02/2022 09:15   DG Chest Port 1 View  Result Date: 01/02/2022 CLINICAL DATA:  Acute respiratory distress EXAM: PORTABLE CHEST 1 VIEW COMPARISON:  01/01/2022, 12/27/2021 FINDINGS: Mild cardiomegaly with vascular congestion and mild  interstitial edema. Small bilateral effusions. Basilar consolidation on the left. Findings do not appear significantly changed. IMPRESSION: 1. Cardiomegaly with vascular congestion, mild interstitial edema and small pleural effusions 2. Consolidation at the left lung base may be due to atelectasis or pneumonia. Electronically Signed   By: Donavan Foil M.D.   On: 01/02/2022 01:44   DG CHEST PORT 1 VIEW  Result Date: 01/01/2022 CLINICAL DATA:  200811 decreased pulse ox EXAM: PORTABLE CHEST 1 VIEW COMPARISON:  December 29, 2021 FINDINGS: Heart is borderline. Minor atelectasis at the left lung base. Central pulmonary vascular congestion and is more prominent in the present study. The visualized skeletal structures are unremarkable. IMPRESSION: Heart is borderline. Central pulmonary vascular congestion. Left basilar minor atelectasis. Electronically Signed   By: Frazier Richards M.D.   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 Okay to DC heparin in view of marked thrombocytopenia and high risk of bleeding. Prognosis grave. I will sign off.  Please call if needed  LOS: 4 days    Charolette Forward 01/03/2022, 9:37 AM

## 2022-01-04 ENCOUNTER — Other Ambulatory Visit (HOSPITAL_COMMUNITY): Payer: Self-pay

## 2022-01-04 DIAGNOSIS — R6510 Systemic inflammatory response syndrome (SIRS) of non-infectious origin without acute organ dysfunction: Secondary | ICD-10-CM | POA: Diagnosis not present

## 2022-01-04 LAB — CULTURE, BLOOD (ROUTINE X 2): Special Requests: ADEQUATE

## 2022-01-04 LAB — KAPPA/LAMBDA LIGHT CHAINS
Kappa free light chain: 969.1 mg/L — ABNORMAL HIGH (ref 3.3–19.4)
Kappa, lambda light chain ratio: 179.46 — ABNORMAL HIGH (ref 0.26–1.65)
Lambda free light chains: 5.4 mg/L — ABNORMAL LOW (ref 5.7–26.3)

## 2022-01-05 LAB — MULTIPLE MYELOMA PANEL, SERUM
Albumin SerPl Elph-Mcnc: 3.6 g/dL (ref 2.9–4.4)
Albumin/Glob SerPl: 1.9 — ABNORMAL HIGH (ref 0.7–1.7)
Alpha 1: 0.3 g/dL (ref 0.0–0.4)
Alpha2 Glob SerPl Elph-Mcnc: 0.4 g/dL (ref 0.4–1.0)
B-Globulin SerPl Elph-Mcnc: 0.6 g/dL — ABNORMAL LOW (ref 0.7–1.3)
Gamma Glob SerPl Elph-Mcnc: 0.7 g/dL (ref 0.4–1.8)
Globulin, Total: 2 g/dL — ABNORMAL LOW (ref 2.2–3.9)
IgA: 33 mg/dL — ABNORMAL LOW (ref 64–422)
IgG (Immunoglobin G), Serum: 848 mg/dL (ref 586–1602)
IgM (Immunoglobulin M), Srm: 21 mg/dL — ABNORMAL LOW (ref 26–217)
Total Protein ELP: 5.6 g/dL — ABNORMAL LOW (ref 6.0–8.5)

## 2022-01-05 LAB — PTH-RELATED PEPTIDE: PTH-related peptide: <2 pmol/L

## 2022-01-07 LAB — CULTURE, BLOOD (ROUTINE X 2): Culture: NO GROWTH

## 2022-01-08 LAB — FLOW CYTOMETRY

## 2022-01-23 NOTE — Progress Notes (Addendum)
This chaplain responded to Dr. Doristine Section consult for prayer and EOL spiritual care.   Upon arrival on the unit, the chaplain learned the Pt. passed earlier this morning. The chaplain understands family has stepped away from the room and plans to return. The chaplain left a contact number with the NT to page as needed.  **[phone] The chaplain returned to the Pt. room with the Pt. son-[name] and daughter-[name]. The chaplain is appreciative of RN-Denise presence and care at the time of death.   The chaplain prayed with the family and listened reflectively as the family shared their grief and the events of yesterday and today. The chaplain understands Coralyn Pear is interested in grief care for the children and adults. Education was provided along with community resources. Resources were also requested for more information on the Pt. diagnosis. The chaplain connected [facility] PA-Josseline. The chaplain understands PA-Josseline will email the family.  This chaplain is available for F/U spiritual care as needed.  Chaplain Sallyanne Kuster [phone]

## 2022-01-23 NOTE — Progress Notes (Signed)
Family left at 59 after speaking with the chaplain. Son and daughter both are processing through their grief. Informed by daughter Coralyn Pear that family has contacted   [facility] in [place], [place]. [address] [place], [place] [address] Hours: Open now  Phone: [phone]  Coralyn Pear states she spoke with Mr. Burnett Corrente who will be expecting a telephone call.

## 2022-01-23 NOTE — Care Management Important Message (Signed)
Important Message  Patient Details  Name: [name] MRN: [id number] Date of Birth: 1946/06/12   Medicare Important Message Given:  Yes     Shelda Altes 01-14-22, 10:04 AM

## 2022-01-23 NOTE — Progress Notes (Signed)
[name] transitioned to eternal rest this morning at [phone]. At her passing, her son Timmothy Sours and her daughter Coralyn Pear were at the bedside. Two nurses first verified death at 66 Beverlee Nims and Rodman Key). Then the oncoming nurse Langley Gauss and off going nurse Marcille Blanco verified there was no pulse nor heart beat. An attempt to obtain a blood pressure was not successful.  A stat request for the chaplain was entered and the attending MD Gilles Chiquito was notified as well. Information was called to the [facility] number at [phone] was made. Nurse spoke with Ellery Plunk and she determined patient was not a candidate for donation. The reference number given is [id number].  Family is still at the bedside mourning the loss of their mother at this time.

## 2022-01-23 NOTE — Death Summary Note (Signed)
  Name: [name] MRN: [id number] DOB: 06/04/1946 76 y.o.  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    Hospital Course: [name] is a 76 y.o. with a pertinent PMH of type 2 diabetes with neuropathy, renal transplant in 2010, AAA status post iliac stent, hypothyroidism,osteoarthritis, HLD, and CKD 3, who presented with general aches and malaise and poor appetite and initially admitted for SIRS criteria.  She had an extensive infectious work-up and was empirically treated with IV vancomycin, ceftriaxone, and cefepime with no clear source of infection. While in the hospital she developed A-fib with RVR, which was treated aggressively with diltiazem drip, amiodarone drip, IV metoprolol and converted to normal sinus with adequately controlled rate. Due to continued profound leukocytosis, thrombocytopenia, and anemia she was also worked up for possible hematologic malignancy, which revealed an ultimate diagnosis of plasma cell leukemia.  Oncology was able to meet with the family and counseled them on outcomes, namely that this specific disease is very aggressive and that patient likely will not be a candidate for, or benefit from, treatment. After discussing the diagnosis, the family, with the assistance of the primary treatment team and palliative care, decided to transition the patient to comfort care measures.  Her  treatment was modified to prioritize her comfort.  She passed quickly after transition to comfort care measures, at 0736 on February 02, 2022 with family present in the room.  Signed: Linus Galas, MD 02/02/22, 9:58 AM

## 2022-01-23 DEATH — deceased

## 2022-08-03 ENCOUNTER — Other Ambulatory Visit: Payer: Self-pay | Admitting: Internal Medicine

## 2022-08-03 DIAGNOSIS — K219 Gastro-esophageal reflux disease without esophagitis: Secondary | ICD-10-CM
